# Patient Record
Sex: Male | Born: 1945 | Race: Black or African American | Hispanic: No | Marital: Married | Smoking: Former smoker
Health system: Southern US, Community
[De-identification: ages and names within clinical notes are randomized; demographics above are authoritative.]

## PROBLEM LIST (undated history)

## (undated) DIAGNOSIS — I739 Peripheral vascular disease, unspecified: Secondary | ICD-10-CM

## (undated) DIAGNOSIS — K5790 Diverticulosis of intestine, part unspecified, without perforation or abscess without bleeding: Secondary | ICD-10-CM

## (undated) DIAGNOSIS — I639 Cerebral infarction, unspecified: Secondary | ICD-10-CM

## (undated) DIAGNOSIS — M1990 Unspecified osteoarthritis, unspecified site: Secondary | ICD-10-CM

## (undated) DIAGNOSIS — I471 Supraventricular tachycardia, unspecified: Secondary | ICD-10-CM

## (undated) DIAGNOSIS — E785 Hyperlipidemia, unspecified: Secondary | ICD-10-CM

## (undated) DIAGNOSIS — D649 Anemia, unspecified: Secondary | ICD-10-CM

## (undated) DIAGNOSIS — I10 Essential (primary) hypertension: Secondary | ICD-10-CM

## (undated) DIAGNOSIS — F0280 Dementia in other diseases classified elsewhere without behavioral disturbance: Secondary | ICD-10-CM

## (undated) DIAGNOSIS — R7303 Prediabetes: Secondary | ICD-10-CM

## (undated) DIAGNOSIS — Z8639 Personal history of other endocrine, nutritional and metabolic disease: Secondary | ICD-10-CM

## (undated) DIAGNOSIS — F0390 Unspecified dementia without behavioral disturbance: Secondary | ICD-10-CM

## (undated) DIAGNOSIS — H269 Unspecified cataract: Secondary | ICD-10-CM

## (undated) DIAGNOSIS — G629 Polyneuropathy, unspecified: Secondary | ICD-10-CM

## (undated) HISTORY — DX: Peripheral vascular disease, unspecified: I73.9

## (undated) HISTORY — DX: Diverticulosis of intestine, part unspecified, without perforation or abscess without bleeding: K57.90

## (undated) HISTORY — DX: Hyperlipidemia, unspecified: E78.5

## (undated) HISTORY — PX: HERNIA REPAIR: SHX51

## (undated) HISTORY — DX: Personal history of other endocrine, nutritional and metabolic disease: Z86.39

## (undated) HISTORY — DX: Supraventricular tachycardia, unspecified: I47.10

## (undated) HISTORY — DX: Cerebral infarction, unspecified: I63.9

## (undated) HISTORY — DX: Polyneuropathy, unspecified: G62.9

---

## 1998-05-17 ENCOUNTER — Emergency Department (HOSPITAL_COMMUNITY): Admission: EM | Admit: 1998-05-17 | Discharge: 1998-05-17 | Payer: Self-pay | Admitting: Emergency Medicine

## 2001-04-13 ENCOUNTER — Emergency Department (HOSPITAL_COMMUNITY): Admission: EM | Admit: 2001-04-13 | Discharge: 2001-04-13 | Payer: Self-pay | Admitting: Emergency Medicine

## 2003-08-29 ENCOUNTER — Emergency Department (HOSPITAL_COMMUNITY): Admission: EM | Admit: 2003-08-29 | Discharge: 2003-08-29 | Payer: Self-pay | Admitting: Emergency Medicine

## 2003-09-14 ENCOUNTER — Emergency Department (HOSPITAL_COMMUNITY): Admission: EM | Admit: 2003-09-14 | Discharge: 2003-09-14 | Payer: Self-pay | Admitting: Emergency Medicine

## 2003-09-20 ENCOUNTER — Ambulatory Visit (HOSPITAL_COMMUNITY): Admission: RE | Admit: 2003-09-20 | Discharge: 2003-09-20 | Payer: Self-pay | Admitting: Orthopedic Surgery

## 2008-07-19 ENCOUNTER — Ambulatory Visit (HOSPITAL_COMMUNITY): Admission: RE | Admit: 2008-07-19 | Discharge: 2008-07-19 | Payer: Self-pay | Admitting: Ophthalmology

## 2009-07-18 ENCOUNTER — Encounter (INDEPENDENT_AMBULATORY_CARE_PROVIDER_SITE_OTHER): Payer: Self-pay | Admitting: General Surgery

## 2009-07-18 ENCOUNTER — Inpatient Hospital Stay (HOSPITAL_COMMUNITY): Admission: RE | Admit: 2009-07-18 | Discharge: 2009-07-23 | Payer: Self-pay | Admitting: General Surgery

## 2009-09-19 ENCOUNTER — Encounter (INDEPENDENT_AMBULATORY_CARE_PROVIDER_SITE_OTHER): Payer: Self-pay | Admitting: General Surgery

## 2009-09-19 ENCOUNTER — Ambulatory Visit (HOSPITAL_COMMUNITY): Admission: RE | Admit: 2009-09-19 | Discharge: 2009-09-20 | Payer: Self-pay | Admitting: General Surgery

## 2009-11-07 ENCOUNTER — Encounter: Payer: Self-pay | Admitting: Internal Medicine

## 2009-11-08 ENCOUNTER — Encounter: Payer: Self-pay | Admitting: Internal Medicine

## 2009-11-19 ENCOUNTER — Ambulatory Visit (HOSPITAL_COMMUNITY): Admission: RE | Admit: 2009-11-19 | Discharge: 2009-11-19 | Payer: Self-pay | Admitting: Internal Medicine

## 2009-11-19 ENCOUNTER — Ambulatory Visit: Payer: Self-pay | Admitting: Internal Medicine

## 2009-11-19 HISTORY — PX: COLONOSCOPY: SHX5424

## 2009-11-21 ENCOUNTER — Encounter: Payer: Self-pay | Admitting: Internal Medicine

## 2010-06-04 NOTE — Medication Information (Signed)
Summary: Tax adviser   Imported By: Rosine Beat 11/07/2009 09:11:34  _____________________________________________________________________  External Attachment:    Type:   Image     Comment:   External Document

## 2010-06-04 NOTE — Letter (Signed)
Summary: Patient Notice, Colon Biopsy Results  [facility]  [address]   [place], [place] [address]   Phone: [phone]  Fax: [phone]       November 21, 2009   Javier [name] [address] [place], [place]  [address] Dec 18, 1945    Dear Javier [name],  I am pleased to inform you that the biopsies taken during your recent colonoscopy did not show any evidence of cancer upon pathologic examination.  Additional information/recommendations:  You should have a repeat colonoscopy examination  in 5 years.  Please call us if you are having persistent problems or have questions about your condition that have not been fully answered at this time.  Sincerely,    R. Roetta Sessions MD, FACP [facility] Ph: [phone]    Fax: [phone]   Appended Document: Patient Notice, Colon Biopsy Results letter mailed to pt  Appended Document: Patient Notice, Colon Biopsy Results REMINDER IN COMPUTER

## 2010-06-04 NOTE — Letter (Signed)
Summary: Internal Other Domingo Dimes  Internal Other Domingo Dimes   Imported By: Cloria Spring LPN 16/02/9603 54:09:81  _____________________________________________________________________  External Attachment:    Type:   Image     Comment:   External Document

## 2010-07-22 LAB — BASIC METABOLIC PANEL
CO2: 31 mEq/L (ref 19–32)
Creatinine, Ser: 0.7 mg/dL (ref 0.4–1.5)
GFR calc Af Amer: >60 mL/min (ref >60)
GFR calc non Af Amer: >60 mL/min (ref >60)

## 2010-07-22 LAB — DIFFERENTIAL
Basophils Relative: 1 % (ref 0–1)
Eosinophils Absolute: 0.2 10*3/uL (ref 0.0–0.7)
Eosinophils Relative: 3 % (ref 0–5)
Lymphocytes Relative: 37 % (ref 12–46)
Monocytes Absolute: 0.8 10*3/uL (ref 0.1–1.0)
Monocytes Relative: 13 % — ABNORMAL HIGH (ref 3–12)
Neutro Abs: 2.7 10*3/uL (ref 1.7–7.7)
Neutrophils Relative %: 46 % (ref 43–77)

## 2010-07-22 LAB — CBC
Hemoglobin: 11.4 g/dL — ABNORMAL LOW (ref 13.0–17.0)
MCHC: 34.4 g/dL (ref 30.0–36.0)
RDW: 14.4 % (ref 11.5–15.5)

## 2010-07-22 LAB — HEPATIC FUNCTION PANEL: Bilirubin, Direct: 0.1 mg/dL (ref 0.0–0.3)

## 2010-07-28 LAB — BASIC METABOLIC PANEL
BUN: 19 mg/dL (ref 6–23)
CO2: 31 mEq/L (ref 19–32)
CO2: 31 mEq/L (ref 19–32)
CO2: 36 mEq/L — ABNORMAL HIGH (ref 19–32)
Calcium: 8.6 mg/dL (ref 8.4–10.5)
Chloride: 96 mEq/L (ref 96–112)
Creatinine, Ser: 0.93 mg/dL (ref 0.4–1.5)
GFR calc Af Amer: >60 mL/min (ref >60)
GFR calc Af Amer: >60 mL/min (ref >60)
GFR calc Af Amer: >60 mL/min (ref >60)
GFR calc non Af Amer: >60 mL/min (ref >60)
GFR calc non Af Amer: >60 mL/min (ref >60)
GFR calc non Af Amer: >60 mL/min (ref >60)
Glucose, Bld: 125 mg/dL — ABNORMAL HIGH (ref 70–99)
Glucose, Bld: 126 mg/dL — ABNORMAL HIGH (ref 70–99)
Glucose, Bld: 160 mg/dL — ABNORMAL HIGH (ref 70–99)
Potassium: 3.5 mEq/L (ref 3.5–5.1)
Sodium: 137 mEq/L (ref 135–145)

## 2010-07-28 LAB — DIFFERENTIAL
Basophils Relative: 0 % (ref 0–1)
Eosinophils Absolute: 0.1 10*3/uL (ref 0.0–0.7)
Eosinophils Relative: 1 % (ref 0–5)
Lymphocytes Relative: 10 % — ABNORMAL LOW (ref 12–46)
Lymphocytes Relative: 14 % (ref 12–46)
Monocytes Absolute: 1.3 10*3/uL — ABNORMAL HIGH (ref 0.1–1.0)
Monocytes Relative: 10 % (ref 3–12)
Monocytes Relative: 16 % — ABNORMAL HIGH (ref 3–12)
Neutro Abs: 6.2 10*3/uL (ref 1.7–7.7)
Neutrophils Relative %: 74 % (ref 43–77)

## 2010-07-28 LAB — MAGNESIUM: Magnesium: 1.9 mg/dL (ref 1.5–2.5)

## 2010-07-28 LAB — GLUCOSE, CAPILLARY
Glucose-Capillary: 117 mg/dL — ABNORMAL HIGH (ref 70–99)
Glucose-Capillary: 120 mg/dL — ABNORMAL HIGH (ref 70–99)
Glucose-Capillary: 122 mg/dL — ABNORMAL HIGH (ref 70–99)
Glucose-Capillary: 124 mg/dL — ABNORMAL HIGH (ref 70–99)
Glucose-Capillary: 129 mg/dL — ABNORMAL HIGH (ref 70–99)
Glucose-Capillary: 134 mg/dL — ABNORMAL HIGH (ref 70–99)
Glucose-Capillary: 138 mg/dL — ABNORMAL HIGH (ref 70–99)

## 2010-07-28 LAB — CBC
HCT: 36.5 % — ABNORMAL LOW (ref 39.0–52.0)
MCHC: 34.1 g/dL (ref 30.0–36.0)

## 2010-08-15 LAB — HEMOGLOBIN AND HEMATOCRIT, BLOOD
HCT: 39.2 % (ref 39.0–52.0)
Hemoglobin: 13.5 g/dL (ref 13.0–17.0)

## 2010-08-15 LAB — BASIC METABOLIC PANEL
Calcium: 9 mg/dL (ref 8.4–10.5)
Creatinine, Ser: 0.74 mg/dL (ref 0.4–1.5)
GFR calc Af Amer: >60 mL/min (ref >60)
Sodium: 137 mEq/L (ref 135–145)

## 2012-05-25 ENCOUNTER — Encounter (HOSPITAL_COMMUNITY): Payer: Self-pay | Admitting: *Deleted

## 2012-05-25 ENCOUNTER — Emergency Department (HOSPITAL_COMMUNITY)
Admission: EM | Admit: 2012-05-25 | Discharge: 2012-05-25 | Disposition: A | Discharge status: Home / Self Care | Payer: Medicare Other | Attending: Emergency Medicine | Admitting: Emergency Medicine

## 2012-05-25 DIAGNOSIS — Z79899 Other long term (current) drug therapy: Secondary | ICD-10-CM | POA: Insufficient documentation

## 2012-05-25 DIAGNOSIS — Y939 Activity, unspecified: Secondary | ICD-10-CM | POA: Insufficient documentation

## 2012-05-25 DIAGNOSIS — H40059 Ocular hypertension, unspecified eye: Secondary | ICD-10-CM

## 2012-05-25 DIAGNOSIS — S058X9A Other injuries of unspecified eye and orbit, initial encounter: Secondary | ICD-10-CM | POA: Insufficient documentation

## 2012-05-25 DIAGNOSIS — X58XXXA Exposure to other specified factors, initial encounter: Secondary | ICD-10-CM | POA: Insufficient documentation

## 2012-05-25 DIAGNOSIS — H5789 Other specified disorders of eye and adnexa: Secondary | ICD-10-CM | POA: Insufficient documentation

## 2012-05-25 DIAGNOSIS — H538 Other visual disturbances: Secondary | ICD-10-CM | POA: Insufficient documentation

## 2012-05-25 DIAGNOSIS — Y929 Unspecified place or not applicable: Secondary | ICD-10-CM | POA: Insufficient documentation

## 2012-05-25 DIAGNOSIS — H40009 Preglaucoma, unspecified, unspecified eye: Secondary | ICD-10-CM | POA: Insufficient documentation

## 2012-05-25 DIAGNOSIS — S0501XA Injury of conjunctiva and corneal abrasion without foreign body, right eye, initial encounter: Secondary | ICD-10-CM

## 2012-05-25 DIAGNOSIS — Z87891 Personal history of nicotine dependence: Secondary | ICD-10-CM | POA: Insufficient documentation

## 2012-05-25 DIAGNOSIS — H5710 Ocular pain, unspecified eye: Secondary | ICD-10-CM | POA: Insufficient documentation

## 2012-05-25 DIAGNOSIS — Z791 Long term (current) use of non-steroidal anti-inflammatories (NSAID): Secondary | ICD-10-CM | POA: Insufficient documentation

## 2012-05-25 DIAGNOSIS — I10 Essential (primary) hypertension: Secondary | ICD-10-CM | POA: Insufficient documentation

## 2012-05-25 DIAGNOSIS — H269 Unspecified cataract: Secondary | ICD-10-CM | POA: Insufficient documentation

## 2012-05-25 HISTORY — DX: Unspecified cataract: H26.9

## 2012-05-25 HISTORY — DX: Essential (primary) hypertension: I10

## 2012-05-25 MED ORDER — TETRACAINE HCL 0.5 % OP SOLN
2.0000 [drp] | Freq: Once | OPHTHALMIC | Status: AC
  Administered 2012-05-25: 2 [drp] via OPHTHALMIC
  Filled 2012-05-25: qty 2

## 2012-05-25 MED ORDER — FLUORESCEIN SODIUM 1 MG OP STRP
ORAL_STRIP | OPHTHALMIC | Status: AC
  Administered 2012-05-25: 11:00:00
  Filled 2012-05-25: qty 1

## 2012-05-25 NOTE — ED Provider Notes (Signed)
History     CSN: [id number]  Arrival date & time 05/25/12  8657   First MD Initiated Contact with Patient 05/25/12 [phone]      Chief Complaint  Patient presents with  . Eye Problem    (Consider location/radiation/quality/duration/timing/severity/associated sxs/prior treatment) HPI Comments: Patient c/o "foggy" vision in the right eye for several days.  States that he has been told he has a cataract in the right eye.  Describes the "fogginess" as gradual in onset and occasionally accompanied by pain to the right eye.  He states that he is suppose to wear glasses but does not have money to buy them and he is waiting until he is egilible for Medicare.  He denies contacts, fever, chills, neck pain, headache, or "curtain sensation" to his vision.    Has appt with his eye doctor on January 31  Patient is a 67 y.o. male presenting with eye pain. The history is provided by the patient.  Eye Pain This is a recurrent problem. The current episode started in the past 7 days. The problem occurs constantly. The problem has been unchanged. Associated symptoms include a visual change. Pertinent negatives include no chest pain, fever, headaches, myalgias, nausea, neck pain, numbness, rash, sore throat, urinary symptoms, vomiting or weakness. Nothing aggravates the symptoms. He has tried nothing for the symptoms. The treatment provided no relief.    Past Medical History  Diagnosis Date  . Hypertension   . Cataract     Past Surgical History  Procedure Date  . Hernia repair     No family history on file.  History  Substance Use Topics  . Smoking status: Former Games developer  . Smokeless tobacco: Not on file  . Alcohol Use: No      Review of Systems  Constitutional: Negative for fever.  HENT: Negative for sore throat and neck pain.   Eyes: Positive for pain, redness and visual disturbance. Negative for photophobia, discharge and itching.  Respiratory: Negative for chest tightness and shortness of  breath.   Cardiovascular: Negative for chest pain.  Gastrointestinal: Negative for nausea and vomiting.  Musculoskeletal: Negative for myalgias.  Skin: Negative for rash.  Neurological: Negative for dizziness, seizures, syncope, facial asymmetry, speech difficulty, weakness, numbness and headaches.  All other systems reviewed and are negative.    Allergies  Review of patient's allergies indicates no known allergies.  Home Medications   Current Outpatient Rx  Name  Route  Sig  Dispense  Refill  . AMLODIPINE BESYLATE 5 MG PO TABS   Oral   Take 5 mg by mouth daily.         Marland Kitchen METOPROLOL SUCCINATE ER 50 MG PO TB24   Oral   Take 50 mg by mouth daily. Take with or immediately following a meal.         . NAPROXEN 500 MG PO TABS   Oral   Take 500 mg by mouth 2 (two) times daily with a meal.         . OLMESARTAN MEDOXOMIL 20 MG PO TABS   Oral   Take 20 mg by mouth daily.         Marland Kitchen ROSUVASTATIN CALCIUM 20 MG PO TABS   Oral   Take 20 mg by mouth daily.           BP 158/85  Pulse 71  Temp 97.7 F (36.5 C)  Resp 20  SpO2 98%  Physical Exam  Nursing note and vitals reviewed. Constitutional: He is oriented  to person, place, and time. He appears well-developed and well-nourished. No distress.  HENT:  Head: Normocephalic and atraumatic.  Eyes: Pupils are equal, round, and reactive to light. No foreign bodies found. Right eye exhibits no chemosis and no discharge. Left eye exhibits no chemosis and no discharge. Right conjunctiva is injected. Right conjunctiva has no hemorrhage. Left conjunctiva is not injected. Left conjunctiva has no hemorrhage. Right eye exhibits normal extraocular motion and no nystagmus. Left eye exhibits normal extraocular motion and no nystagmus. Pupils are equal.  Fundoscopic exam:      The right eye shows no exudate and no papilledema.  Slit lamp exam:      The right eye shows corneal abrasion and fluorescein uptake. The right eye shows no  corneal flare, no corneal ulcer, no foreign body, no hyphema and no hypopyon.  Neck: Normal range of motion. Neck supple. No thyromegaly present.  Cardiovascular: Normal rate, normal heart sounds and intact distal pulses.   No murmur heard. Pulmonary/Chest: Effort normal and breath sounds normal. No respiratory distress.  Musculoskeletal: Normal range of motion. He exhibits no edema.  Lymphadenopathy:    He has no cervical adenopathy.  Neurological: He is alert and oriented to person, place, and time. He exhibits normal muscle tone. Coordination normal.  Skin: Skin is warm and dry.    ED Course  Procedures (including critical care time)  Labs Reviewed - No data to display No results found.  IOP checked with [facility]  Right IOP:  32, 32, 44, 34 Left IOP:  7,10, 14   Visual acuity reviewed by me.    MDM  Pt seen by EDP as well and discussed care plan.  Pt is ambulatory and denies sx's at this time.      11:15  Consulted Dr. Charise Killian with [facility] regarding patient exam today and IOP's.  Will see pt in his office.  Appt made for today (05/25/12) at 2:00 pm.    Pt verbalized understanding and agrees to the care plan.         Javonnie Illescas L. Timberlynn Kizziah, [place] 05/27/12 1659

## 2012-05-25 NOTE — ED Notes (Signed)
Pt c/o "foggy" vision in right eye, pt has hx of cataract in right eye, states that his vision has become "foggy" over the past week, has eye appointment on June 05, 2011. States that the eye does "water",

## 2012-05-30 NOTE — ED Provider Notes (Signed)
Medical screening examination/treatment/procedure(s) were performed by non-physician practitioner and as supervising physician I was immediately available for consultation/collaboration.   Benny Lennert, MD 05/30/12 [phone]

## 2012-08-09 DIAGNOSIS — Z947 Corneal transplant status: Secondary | ICD-10-CM | POA: Insufficient documentation

## 2014-10-17 ENCOUNTER — Encounter: Payer: Self-pay | Admitting: Internal Medicine

## 2014-11-16 ENCOUNTER — Other Ambulatory Visit: Payer: Self-pay

## 2014-11-16 ENCOUNTER — Ambulatory Visit (INDEPENDENT_AMBULATORY_CARE_PROVIDER_SITE_OTHER): Payer: Medicare Other | Admitting: Nurse Practitioner

## 2014-11-16 ENCOUNTER — Encounter: Payer: Self-pay | Admitting: Nurse Practitioner

## 2014-11-16 VITALS — BP 114/67 | HR 70 | Temp 97.9°F | Ht 71.0 in | Wt 168.0 lb

## 2014-11-16 DIAGNOSIS — Z86010 Personal history of colonic polyps: Secondary | ICD-10-CM | POA: Diagnosis not present

## 2014-11-16 MED ORDER — PEG 3350-KCL-NA BICARB-NACL 420 G PO SOLR: 4000.0000 mL | Freq: Once | ORAL | Status: DC

## 2014-11-16 NOTE — Patient Instructions (Signed)
1. We will see her procedure for you. 2. Further recommendations to be based on the results of your procedure.

## 2014-11-16 NOTE — Progress Notes (Signed)
Primary Care Physician:  Maricela Curet, MD Primary Gastroenterologist:  Dr. Gala Romney  Chief Complaint  Patient presents with  . Colonoscopy    HPI:   69 year old male presents for scheduling of a repeat colonoscopy. Last colonoscopy completed on 11/19/2009 under conscious sedation. Findings include multiple small rectal polyps status post polypectomy, multiple left colon polyps status post polypectomy. Noted poor prep. Pathology returned as descending and sigmoid polyps comprised of serrated adenoma and rectal polyps as hyperplastic. Recommended 5 year repeat. He is currently due for a repeat colonoscopy.  Today he states   Past Medical History  Diagnosis Date  . Hypertension   . Cataract     Past Surgical History  Procedure Laterality Date  . Hernia repair    . Colonoscopy  11/19/2009    RMR: 1. Multiple small rectal polyps status post snare polypectomy. 2. Multiple left colon polyp status post snare removal . Poor prep compromised exam.     Current Outpatient Prescriptions  Medication Sig Dispense Refill  . amLODipine (NORVASC) 5 MG tablet Take 5 mg by mouth daily.    . metoprolol succinate (TOPROL-XL) 50 MG 24 hr tablet Take 50 mg by mouth daily. Take with or immediately following a meal.    . naproxen (NAPROSYN) 500 MG tablet Take 500 mg by mouth 2 (two) times daily with a meal.    . olmesartan (BENICAR) 20 MG tablet Take 20 mg by mouth daily.    . rosuvastatin (CRESTOR) 20 MG tablet Take 20 mg by mouth daily.     No current facility-administered medications for this visit.    Allergies as of 11/16/2014  . (No Known Allergies)    No family history on file.  History   Social History  . Marital Status: Married    Spouse Name: N/A  . Number of Children: N/A  . Years of Education: N/A   Occupational History  . Not on file.   Social History Main Topics  . Smoking status: Former Research scientist (life sciences)  . Smokeless tobacco: Not on file  . Alcohol Use: No  . Drug Use: No   . Sexual Activity: Not on file   Other Topics Concern  . Not on file   Social History Narrative    Review of Systems: General: Negative for anorexia, weight loss, fever, chills, fatigue, weakness. ENT: Negative for hoarseness, difficulty swallowing. CV: Negative for chest pain, angina, palpitations, dyspnea on exertion, peripheral edema.  Respiratory: Negative for dyspnea at rest, dyspnea on exertion, cough, sputum, wheezing.  GI: See history of present illness. MS: Negative for joint pain, low back pain.  Derm: Negative for rash or itching.  Endo: Negative for unusual weight change.  Heme: Negative for bruising or bleeding. Allergy: Negative for rash or hives.    Physical Exam: BP 114/67 mmHg  Pulse 70  Temp(Src) 97.9 F (36.6 C) (Oral)  Ht 5\' 11"  (1.803 m)  Wt 168 lb (76.204 kg)  BMI 23.44 kg/m2 General:   Alert and oriented. Pleasant and cooperative. Well-nourished and well-developed.  Head:  Normocephalic and atraumatic. Eyes:  Without icterus, sclera clear and conjunctiva pink.  Ears:  Normal auditory acuity. Cardiovascular:  S1, S2 present without murmurs appreciated. Extremities without clubbing or edema. Respiratory:  Clear to auscultation bilaterally. No wheezes, rales, or rhonchi. No distress.  Gastrointestinal:  +BS, soft, non-tender and non-distended. No HSM noted. No guarding or rebound. No masses appreciated.  Rectal:  Deferred  Neurologic:  Alert and oriented x4;  grossly normal neurologically. Psych:  Alert and cooperative. Normal mood and affect. Heme/Lymph/Immune: No excessive bruising noted.    11/16/2014 10:15 AM

## 2014-11-16 NOTE — Assessment & Plan Note (Signed)
69 year old male with last colonoscopy in 2011 with adenomatous polyps removed. Recommend 5 year follow-up and he is currently due. He is currently asymptomatic from a GI standpoint. States his conscious sedation was adequate at his last procedure. At this point we will proceed with surveillance colonoscopy.  Proceed with TCS with Dr. Gala Romney in near future: the risks, benefits, and alternatives have been discussed with the patient in detail. The patient states understanding and desires to proceed.  The patient is not on any anticoagulants, anxiolytics, chronic pain medicines, or antidepressants. Denies alcohol and drug use. Conscious sedation should again be adequate for this procedure.

## 2014-11-17 NOTE — Progress Notes (Signed)
cc'ed to pcp °

## 2014-12-13 ENCOUNTER — Encounter (HOSPITAL_COMMUNITY): Payer: Self-pay | Admitting: *Deleted

## 2014-12-13 ENCOUNTER — Encounter (HOSPITAL_COMMUNITY)
Admission: RE | Disposition: A | Discharge status: Home / Self Care | Payer: Self-pay | Source: Ambulatory Visit | Attending: Internal Medicine

## 2014-12-13 ENCOUNTER — Ambulatory Visit (HOSPITAL_COMMUNITY)
Admission: RE | Admit: 2014-12-13 | Discharge: 2014-12-13 | Disposition: A | Discharge status: Home / Self Care | Payer: Medicare Other | Source: Ambulatory Visit | Attending: Internal Medicine | Admitting: Internal Medicine

## 2014-12-13 DIAGNOSIS — Z87891 Personal history of nicotine dependence: Secondary | ICD-10-CM | POA: Diagnosis not present

## 2014-12-13 DIAGNOSIS — Z1211 Encounter for screening for malignant neoplasm of colon: Secondary | ICD-10-CM | POA: Insufficient documentation

## 2014-12-13 DIAGNOSIS — H269 Unspecified cataract: Secondary | ICD-10-CM | POA: Insufficient documentation

## 2014-12-13 DIAGNOSIS — K5730 Diverticulosis of large intestine without perforation or abscess without bleeding: Secondary | ICD-10-CM | POA: Diagnosis not present

## 2014-12-13 DIAGNOSIS — I10 Essential (primary) hypertension: Secondary | ICD-10-CM | POA: Diagnosis not present

## 2014-12-13 DIAGNOSIS — Z86010 Personal history of colonic polyps: Secondary | ICD-10-CM | POA: Insufficient documentation

## 2014-12-13 HISTORY — PX: COLONOSCOPY: SHX5424

## 2014-12-13 SURGERY — COLONOSCOPY
Anesthesia: Moderate Sedation

## 2014-12-13 MED ORDER — MEPERIDINE HCL 100 MG/ML IJ SOLN
INTRAMUSCULAR | Status: AC
  Filled 2014-12-13: qty 2

## 2014-12-13 MED ORDER — SODIUM CHLORIDE 0.9 % IV SOLN
INTRAVENOUS | Status: DC
  Administered 2014-12-13: 1000 mL via INTRAVENOUS

## 2014-12-13 MED ORDER — MIDAZOLAM HCL 5 MG/5ML IJ SOLN
INTRAMUSCULAR | Status: DC | PRN
  Administered 2014-12-13 (×3): 2 mg via INTRAVENOUS
  Administered 2014-12-13: 1 mg via INTRAVENOUS

## 2014-12-13 MED ORDER — ONDANSETRON HCL 4 MG/2ML IJ SOLN
INTRAMUSCULAR | Status: DC
Start: 2014-12-13 — End: 2014-12-13
  Filled 2014-12-13: qty 2

## 2014-12-13 MED ORDER — ONDANSETRON HCL 4 MG/2ML IJ SOLN
INTRAMUSCULAR | Status: DC | PRN
  Administered 2014-12-13: 4 mg via INTRAVENOUS

## 2014-12-13 MED ORDER — MEPERIDINE HCL 100 MG/ML IJ SOLN
INTRAMUSCULAR | Status: DC | PRN
  Administered 2014-12-13 (×2): 50 mg via INTRAVENOUS

## 2014-12-13 MED ORDER — STERILE WATER FOR IRRIGATION IR SOLN
Status: DC | PRN
  Administered 2014-12-13: 10:00:00

## 2014-12-13 MED ORDER — SIMETHICONE 40 MG/0.6ML PO SUSP
ORAL | Status: AC
  Filled 2014-12-13: qty 60

## 2014-12-13 MED ORDER — MIDAZOLAM HCL 5 MG/5ML IJ SOLN
INTRAMUSCULAR | Status: DC
Start: 2014-12-13 — End: 2014-12-13
  Filled 2014-12-13: qty 10

## 2014-12-13 NOTE — Interval H&P Note (Signed)
History and Physical Interval Note:  12/13/2014 9:56 AM  Javier [name]  has presented today for surgery, with the diagnosis of hx of colon polyps  The various methods of treatment have been discussed with the patient and family. After consideration of risks, benefits and other options for treatment, the patient has consented to  Procedure(s) with comments: COLONOSCOPY (N/A) - 945 as a surgical intervention .  The patient's history has been reviewed, patient examined, no change in status, stable for surgery.  I have reviewed the patient's chart and labs.  Questions were answered to the patient's satisfaction.      No change. Surveillance colonoscopy per plan.   The risks, benefits, limitations, alternatives and imponderables have been reviewed with the patient. Questions have been answered. All parties are agreeable.     Manus Rudd

## 2014-12-13 NOTE — Discharge Instructions (Signed)
Colonoscopy Discharge Instructions  Read the instructions outlined below and refer to this sheet in the next few weeks. These discharge instructions provide you with general information on caring for yourself after you leave the hospital. Your doctor may also give you specific instructions. While your treatment has been planned according to the most current medical practices available, unavoidable complications occasionally occur. If you have any problems or questions after discharge, call Dr. Gala Romney at [phone]. ACTIVITY  You may resume your regular activity, but move at a slower pace for the next 24 hours.   Take frequent rest periods for the next 24 hours.   Walking will help get rid of the air and reduce the bloated feeling in your belly (abdomen).   No driving for 24 hours (because of the medicine (anesthesia) used during the test).    Do not sign any important legal documents or operate any machinery for 24 hours (because of the anesthesia used during the test).  NUTRITION  Drink plenty of fluids.   You may resume your normal diet as instructed by your doctor.   Begin with a light meal and progress to your normal diet. Heavy or fried foods are harder to digest and may make you feel sick to your stomach (nauseated).   Avoid alcoholic beverages for 24 hours or as instructed.  MEDICATIONS  You may resume your normal medications unless your doctor tells you otherwise.  WHAT YOU CAN EXPECT TODAY  Some feelings of bloating in the abdomen.   Passage of more gas than usual.   Spotting of blood in your stool or on the toilet paper.  IF YOU HAD POLYPS REMOVED DURING THE COLONOSCOPY:  No aspirin products for 7 days or as instructed.   No alcohol for 7 days or as instructed.   Eat a soft diet for the next 24 hours.  FINDING OUT THE RESULTS OF YOUR TEST Not all test results are available during your visit. If your test results are not back during the visit, make an appointment  with your caregiver to find out the results. Do not assume everything is normal if you have not heard from your caregiver or the medical facility. It is important for you to follow up on all of your test results.  SEEK IMMEDIATE MEDICAL ATTENTION IF:  You have more than a spotting of blood in your stool.   Your belly is swollen (abdominal distention).   You are nauseated or vomiting.   You have a temperature over 101.   You have abdominal pain or discomfort that is severe or gets worse throughout the day.      Diverticulosis information provided  Repeat colonoscopy in 5 years    Diverticulosis Diverticulosis is the condition that develops when small pouches (diverticula) form in the wall of your colon. Your colon, or large intestine, is where water is absorbed and stool is formed. The pouches form when the inside layer of your colon pushes through weak spots in the outer layers of your colon. CAUSES  No one knows exactly what causes diverticulosis. RISK FACTORS  Being older than 20. Your risk for this condition increases with age. Diverticulosis is rare in people younger than 40 years. By age 55, almost everyone has it.  Eating a low-fiber diet.  Being frequently constipated.  Being overweight.  Not getting enough exercise.  Smoking.  Taking over-the-counter pain medicines, like aspirin and ibuprofen. SYMPTOMS  Most people with diverticulosis do not have symptoms. DIAGNOSIS  Because diverticulosis often  has no symptoms, health care providers often discover the condition during an exam for other colon problems. In many cases, a health care provider will diagnose diverticulosis while using a flexible scope to examine the colon (colonoscopy). TREATMENT  If you have never developed an infection related to diverticulosis, you may not need treatment. If you have had an infection before, treatment may include:  Eating more fruits, vegetables, and grains.  Taking a fiber  supplement.  Taking a live bacteria supplement (probiotic).  Taking medicine to relax your colon. HOME CARE INSTRUCTIONS   Drink at least 6-8 glasses of water each day to prevent constipation.  Try not to strain when you have a bowel movement.  Keep all follow-up appointments. If you have had an infection before:  Increase the fiber in your diet as directed by your health care provider or dietitian.  Take a dietary fiber supplement if your health care provider approves.  Only take medicines as directed by your health care provider. SEEK MEDICAL CARE IF:   You have abdominal pain.  You have bloating.  You have cramps.  You have not gone to the bathroom in 3 days. SEEK IMMEDIATE MEDICAL CARE IF:   Your pain gets worse.  Yourbloating becomes very bad.  You have a fever or chills, and your symptoms suddenly get worse.  You begin vomiting.  You have bowel movements that are bloody or black. MAKE SURE YOU:  Understand these instructions.  Will watch your condition.  Will get help right away if you are not doing well or get worse. Document Released: 01/17/2004 Document Revised: 04/26/2013 Document Reviewed: 03/16/2013 [facility] Patient Information 2015 [place], [place]. This information is not intended to replace advice given to you by your health care provider. Make sure you discuss any questions you have with your health care provider.

## 2014-12-13 NOTE — Op Note (Signed)
[facility] [address] [place], [address]   COLONOSCOPY PROCEDURE REPORT  PATIENT: Javier, [name]  MR#: [id number] BIRTHDATE: [birth year] , 69  yrs. old GENDER: male ENDOSCOPIST: R.  Garfield Cornea, MD FACP [facility] REFERRED MH:[id number] Cindie Laroche, M.D. PROCEDURE DATE:  2014/12/30 PROCEDURE:   Colonoscopy, surveillance INDICATIONS:History of colonic adenoma. MEDICATIONS: Versed 7 mg IV and Demerol 100 mg IV in divided doses. Zofran 4 mg IV. ASA CLASS:       Class II  CONSENT: The risks, benefits, alternatives and imponderables including but not limited to bleeding, perforation as well as the possibility of a missed lesion have been reviewed.  The potential for biopsy, lesion removal, etc. have also been discussed. Questions have been answered.  All parties agreeable.  Please see the history and physical in the medical record for more information.  DESCRIPTION OF PROCEDURE:   After the risks benefits and alternatives of the procedure were thoroughly explained, informed consent was obtained.  The digital rectal exam revealed no abnormalities of the rectum.   The EC-[id number] ([id number])  endoscope was introduced through the anus and advanced to the cecum, which was identified by both the appendix and ileocecal valve. No adverse events experienced.   The quality of the prep was adequate  The instrument was then slowly withdrawn as the colon was fully examined. Estimated blood loss is zero unless otherwise noted in this procedure report.      COLON FINDINGS: Normal-appearing rectal mucosa.  Scattered left-sided diverticula; the remainder of the colonic mucosa appeared normal.  Retroflexed views revealed no abnormalities. .  Withdrawal time=8 minutes 0 seconds.  The scope was withdrawn and the procedure completed. COMPLICATIONS: There were no immediate complications.  ENDOSCOPIC IMPRESSION: Colonic diverticulosis.  RECOMMENDATIONS: Repeat colonoscopy for  surveillance purposes in 5 years  eSigned:  R. Garfield Cornea, MD Rosalita Chessman [facility] 2014-12-30 10:45 AM   cc:  CPT CODES: ICD CODES:  The ICD and CPT codes recommended by this software are interpretations from the data that the clinical staff has captured with the software.  The verification of the translation of this report to the ICD and CPT codes and modifiers is the sole responsibility of the health care institution and practicing physician where this report was generated.  [employer] will not be held responsible for the validity of the ICD and CPT codes included on this report.  AMA assumes no liability for data contained or not contained herein. CPT is a Designer, television/film set of the [employer].  PATIENT NAME:  Javier, [name] MR#: [id number]

## 2014-12-13 NOTE — H&P (View-Only) (Signed)
Primary Care Physician:  Maricela Curet, MD Primary Gastroenterologist:  Dr. Gala Romney  Chief Complaint  Patient presents with  . Colonoscopy    HPI:   69 year old male presents for scheduling of a repeat colonoscopy. Last colonoscopy completed on 11/19/2009 under conscious sedation. Findings include multiple small rectal polyps status post polypectomy, multiple left colon polyps status post polypectomy. Noted poor prep. Pathology returned as descending and sigmoid polyps comprised of serrated adenoma and rectal polyps as hyperplastic. Recommended 5 year repeat. He is currently due for a repeat colonoscopy.  Today he states   Past Medical History  Diagnosis Date  . Hypertension   . Cataract     Past Surgical History  Procedure Laterality Date  . Hernia repair    . Colonoscopy  11/19/2009    RMR: 1. Multiple small rectal polyps status post snare polypectomy. 2. Multiple left colon polyp status post snare removal . Poor prep compromised exam.     Current Outpatient Prescriptions  Medication Sig Dispense Refill  . amLODipine (NORVASC) 5 MG tablet Take 5 mg by mouth daily.    . metoprolol succinate (TOPROL-XL) 50 MG 24 hr tablet Take 50 mg by mouth daily. Take with or immediately following a meal.    . naproxen (NAPROSYN) 500 MG tablet Take 500 mg by mouth 2 (two) times daily with a meal.    . olmesartan (BENICAR) 20 MG tablet Take 20 mg by mouth daily.    . rosuvastatin (CRESTOR) 20 MG tablet Take 20 mg by mouth daily.     No current facility-administered medications for this visit.    Allergies as of 11/16/2014  . (No Known Allergies)    No family history on file.  History   Social History  . Marital Status: Married    Spouse Name: N/A  . Number of Children: N/A  . Years of Education: N/A   Occupational History  . Not on file.   Social History Main Topics  . Smoking status: Former Research scientist (life sciences)  . Smokeless tobacco: Not on file  . Alcohol Use: No  . Drug Use: No   . Sexual Activity: Not on file   Other Topics Concern  . Not on file   Social History Narrative    Review of Systems: General: Negative for anorexia, weight loss, fever, chills, fatigue, weakness. ENT: Negative for hoarseness, difficulty swallowing. CV: Negative for chest pain, angina, palpitations, dyspnea on exertion, peripheral edema.  Respiratory: Negative for dyspnea at rest, dyspnea on exertion, cough, sputum, wheezing.  GI: See history of present illness. MS: Negative for joint pain, low back pain.  Derm: Negative for rash or itching.  Endo: Negative for unusual weight change.  Heme: Negative for bruising or bleeding. Allergy: Negative for rash or hives.    Physical Exam: BP 114/67 mmHg  Pulse 70  Temp(Src) 97.9 F (36.6 C) (Oral)  Ht 5\' 11"  (1.803 m)  Wt 168 lb (76.204 kg)  BMI 23.44 kg/m2 General:   Alert and oriented. Pleasant and cooperative. Well-nourished and well-developed.  Head:  Normocephalic and atraumatic. Eyes:  Without icterus, sclera clear and conjunctiva pink.  Ears:  Normal auditory acuity. Cardiovascular:  S1, S2 present without murmurs appreciated. Extremities without clubbing or edema. Respiratory:  Clear to auscultation bilaterally. No wheezes, rales, or rhonchi. No distress.  Gastrointestinal:  +BS, soft, non-tender and non-distended. No HSM noted. No guarding or rebound. No masses appreciated.  Rectal:  Deferred  Neurologic:  Alert and oriented x4;  grossly normal neurologically. Psych:  Alert and cooperative. Normal mood and affect. Heme/Lymph/Immune: No excessive bruising noted.    11/16/2014 10:15 AM

## 2014-12-18 ENCOUNTER — Encounter (HOSPITAL_COMMUNITY): Payer: Self-pay | Admitting: Internal Medicine

## 2016-04-02 ENCOUNTER — Other Ambulatory Visit (HOSPITAL_COMMUNITY): Payer: Self-pay | Admitting: Family Medicine

## 2016-04-02 ENCOUNTER — Ambulatory Visit (HOSPITAL_COMMUNITY)
Admission: RE | Admit: 2016-04-02 | Discharge: 2016-04-02 | Disposition: A | Discharge status: Home / Self Care | Payer: Medicare Other | Source: Ambulatory Visit | Attending: Family Medicine | Admitting: Family Medicine

## 2016-04-02 DIAGNOSIS — M7731 Calcaneal spur, right foot: Secondary | ICD-10-CM | POA: Insufficient documentation

## 2016-04-02 DIAGNOSIS — M19071 Primary osteoarthritis, right ankle and foot: Secondary | ICD-10-CM | POA: Insufficient documentation

## 2016-04-02 DIAGNOSIS — T148XXA Other injury of unspecified body region, initial encounter: Secondary | ICD-10-CM

## 2016-05-16 DIAGNOSIS — I110 Hypertensive heart disease with heart failure: Secondary | ICD-10-CM | POA: Diagnosis not present

## 2016-05-16 DIAGNOSIS — N400 Enlarged prostate without lower urinary tract symptoms: Secondary | ICD-10-CM | POA: Diagnosis not present

## 2016-05-16 DIAGNOSIS — E784 Other hyperlipidemia: Secondary | ICD-10-CM | POA: Diagnosis not present

## 2016-05-16 DIAGNOSIS — N529 Male erectile dysfunction, unspecified: Secondary | ICD-10-CM | POA: Diagnosis not present

## 2016-06-16 DIAGNOSIS — N529 Male erectile dysfunction, unspecified: Secondary | ICD-10-CM | POA: Diagnosis not present

## 2016-06-16 DIAGNOSIS — E1165 Type 2 diabetes mellitus with hyperglycemia: Secondary | ICD-10-CM | POA: Diagnosis not present

## 2016-06-16 DIAGNOSIS — M2550 Pain in unspecified joint: Secondary | ICD-10-CM | POA: Diagnosis not present

## 2016-06-16 DIAGNOSIS — E784 Other hyperlipidemia: Secondary | ICD-10-CM | POA: Diagnosis not present

## 2016-07-15 DIAGNOSIS — M2550 Pain in unspecified joint: Secondary | ICD-10-CM | POA: Diagnosis not present

## 2016-07-15 DIAGNOSIS — N400 Enlarged prostate without lower urinary tract symptoms: Secondary | ICD-10-CM | POA: Diagnosis not present

## 2016-07-15 DIAGNOSIS — N529 Male erectile dysfunction, unspecified: Secondary | ICD-10-CM | POA: Diagnosis not present

## 2016-07-15 DIAGNOSIS — E784 Other hyperlipidemia: Secondary | ICD-10-CM | POA: Diagnosis not present

## 2016-08-20 DIAGNOSIS — M2550 Pain in unspecified joint: Secondary | ICD-10-CM | POA: Diagnosis not present

## 2016-08-20 DIAGNOSIS — R5382 Chronic fatigue, unspecified: Secondary | ICD-10-CM | POA: Diagnosis not present

## 2016-08-20 DIAGNOSIS — E784 Other hyperlipidemia: Secondary | ICD-10-CM | POA: Diagnosis not present

## 2016-08-20 DIAGNOSIS — I110 Hypertensive heart disease with heart failure: Secondary | ICD-10-CM | POA: Diagnosis not present

## 2016-10-10 DIAGNOSIS — M2550 Pain in unspecified joint: Secondary | ICD-10-CM | POA: Diagnosis not present

## 2016-10-10 DIAGNOSIS — E784 Other hyperlipidemia: Secondary | ICD-10-CM | POA: Diagnosis not present

## 2016-11-18 DIAGNOSIS — I110 Hypertensive heart disease with heart failure: Secondary | ICD-10-CM | POA: Diagnosis not present

## 2016-11-18 DIAGNOSIS — E784 Other hyperlipidemia: Secondary | ICD-10-CM | POA: Diagnosis not present

## 2016-11-18 DIAGNOSIS — M2550 Pain in unspecified joint: Secondary | ICD-10-CM | POA: Diagnosis not present

## 2016-11-18 DIAGNOSIS — R5382 Chronic fatigue, unspecified: Secondary | ICD-10-CM | POA: Diagnosis not present

## 2016-11-25 DIAGNOSIS — E784 Other hyperlipidemia: Secondary | ICD-10-CM | POA: Diagnosis not present

## 2016-11-25 DIAGNOSIS — R945 Abnormal results of liver function studies: Secondary | ICD-10-CM | POA: Diagnosis not present

## 2016-12-02 DIAGNOSIS — M2550 Pain in unspecified joint: Secondary | ICD-10-CM | POA: Diagnosis not present

## 2016-12-02 DIAGNOSIS — R5382 Chronic fatigue, unspecified: Secondary | ICD-10-CM | POA: Diagnosis not present

## 2016-12-02 DIAGNOSIS — I110 Hypertensive heart disease with heart failure: Secondary | ICD-10-CM | POA: Diagnosis not present

## 2016-12-02 DIAGNOSIS — E784 Other hyperlipidemia: Secondary | ICD-10-CM | POA: Diagnosis not present

## 2017-01-19 DIAGNOSIS — R5382 Chronic fatigue, unspecified: Secondary | ICD-10-CM | POA: Diagnosis not present

## 2017-01-19 DIAGNOSIS — I110 Hypertensive heart disease with heart failure: Secondary | ICD-10-CM | POA: Diagnosis not present

## 2017-01-19 DIAGNOSIS — E784 Other hyperlipidemia: Secondary | ICD-10-CM | POA: Diagnosis not present

## 2017-01-19 DIAGNOSIS — M2550 Pain in unspecified joint: Secondary | ICD-10-CM | POA: Diagnosis not present

## 2017-02-17 DIAGNOSIS — I119 Hypertensive heart disease without heart failure: Secondary | ICD-10-CM | POA: Diagnosis not present

## 2017-02-17 DIAGNOSIS — I110 Hypertensive heart disease with heart failure: Secondary | ICD-10-CM | POA: Diagnosis not present

## 2017-02-17 DIAGNOSIS — E7849 Other hyperlipidemia: Secondary | ICD-10-CM | POA: Diagnosis not present

## 2017-02-17 DIAGNOSIS — R5382 Chronic fatigue, unspecified: Secondary | ICD-10-CM | POA: Diagnosis not present

## 2017-02-17 DIAGNOSIS — E559 Vitamin D deficiency, unspecified: Secondary | ICD-10-CM | POA: Diagnosis not present

## 2017-02-17 DIAGNOSIS — M2550 Pain in unspecified joint: Secondary | ICD-10-CM | POA: Diagnosis not present

## 2017-02-17 DIAGNOSIS — R5383 Other fatigue: Secondary | ICD-10-CM | POA: Diagnosis not present

## 2017-02-17 DIAGNOSIS — D510 Vitamin B12 deficiency anemia due to intrinsic factor deficiency: Secondary | ICD-10-CM | POA: Diagnosis not present

## 2017-04-21 DIAGNOSIS — E7849 Other hyperlipidemia: Secondary | ICD-10-CM | POA: Diagnosis not present

## 2017-04-21 DIAGNOSIS — R5382 Chronic fatigue, unspecified: Secondary | ICD-10-CM | POA: Diagnosis not present

## 2017-04-21 DIAGNOSIS — I110 Hypertensive heart disease with heart failure: Secondary | ICD-10-CM | POA: Diagnosis not present

## 2017-05-26 DIAGNOSIS — R5382 Chronic fatigue, unspecified: Secondary | ICD-10-CM | POA: Diagnosis not present

## 2017-05-26 DIAGNOSIS — E7849 Other hyperlipidemia: Secondary | ICD-10-CM | POA: Diagnosis not present

## 2017-05-26 DIAGNOSIS — I110 Hypertensive heart disease with heart failure: Secondary | ICD-10-CM | POA: Diagnosis not present

## 2017-05-27 DIAGNOSIS — E7849 Other hyperlipidemia: Secondary | ICD-10-CM | POA: Diagnosis not present

## 2017-05-27 DIAGNOSIS — I110 Hypertensive heart disease with heart failure: Secondary | ICD-10-CM | POA: Diagnosis not present

## 2019-08-25 ENCOUNTER — Ambulatory Visit: Payer: Medicare Other | Attending: Internal Medicine

## 2019-08-25 DIAGNOSIS — Z23 Encounter for immunization: Secondary | ICD-10-CM

## 2019-08-25 NOTE — Progress Notes (Signed)
   Covid-19 Vaccination Clinic  Name:  Javier [name]    MRN: [id number] DOB: [birth year]  08/25/2019  [name] was observed post Covid-19 immunization for 15 minutes without incident. He was provided with Vaccine Information Sheet and instruction to access the V-Safe system.   [name] was instructed to call 911 with any severe reactions post vaccine: Marland Kitchen Difficulty breathing  . Swelling of face and throat  . A fast heartbeat  . A bad rash all over body  . Dizziness and weakness   Immunizations Administered    Name Date Dose VIS Date Route   Moderna COVID-19 Vaccine 08/25/2019  8:36 AM 0.5 mL 04/2019 Intramuscular   Manufacturer: Moderna   Lot: [id number]   [place]:[id number]

## 2019-09-27 ENCOUNTER — Ambulatory Visit: Payer: Medicare Other | Attending: Internal Medicine

## 2019-09-27 ENCOUNTER — Other Ambulatory Visit: Payer: Self-pay

## 2019-09-27 ENCOUNTER — Ambulatory Visit: Payer: Medicare Other

## 2019-09-27 DIAGNOSIS — Z23 Encounter for immunization: Secondary | ICD-10-CM

## 2019-09-27 NOTE — Progress Notes (Signed)
   Covid-19 Vaccination Clinic  Name:  Javier [name]    MRN: [id number] DOB: 1946-03-01  09/27/2019  Mr. Javier [name] was observed post Covid-19 immunization for 15 minutes without incident. He was provided with Vaccine Information Sheet and instruction to access the V-Safe system.   Mr. Javier [name] was instructed to call 911 with any severe reactions post vaccine: Marland Kitchen Difficulty breathing  . Swelling of face and throat  . A fast heartbeat  . A bad rash all over body  . Dizziness and weakness   Immunizations Administered    Name Date Dose VIS Date Route   Moderna COVID-19 Vaccine 09/27/2019  8:59 AM 0.5 mL 04/2019 Intramuscular   Manufacturer: Moderna   Lot: [id number]   [place]:[id number]

## 2019-11-29 ENCOUNTER — Ambulatory Visit (INDEPENDENT_AMBULATORY_CARE_PROVIDER_SITE_OTHER): Payer: Medicare Other | Admitting: General Surgery

## 2019-11-29 ENCOUNTER — Encounter: Payer: Self-pay | Admitting: General Surgery

## 2019-11-29 ENCOUNTER — Other Ambulatory Visit: Payer: Self-pay

## 2019-11-29 VITALS — BP 121/66 | HR 58 | Temp 97.9°F | Resp 12 | Ht 71.0 in | Wt 154.0 lb

## 2019-11-29 DIAGNOSIS — L723 Sebaceous cyst: Secondary | ICD-10-CM

## 2019-11-29 NOTE — Patient Instructions (Signed)
Epidermal Cyst  An epidermal cyst is a small, painless lump under your skin. The cyst contains a grayish-white, bad-smelling substance (keratin). Do not try to pop or open an epidermal cyst yourself. What are the causes?  A blocked hair follicle.  A hair that curls and re-enters the skin instead of growing straight out of the skin.  A blocked pore.  Irritated skin.  An injury to the skin.  Certain conditions that are passed along from parent to child (inherited).  Human papillomavirus (HPV).  Long-term sun damage to the skin. What increases the risk?  Having acne.  Being overweight.  Being 30-40 years old. What are the signs or symptoms? These cysts are usually harmless, but they can get infected. Symptoms of infection may include:  Redness.  Inflammation.  Tenderness.  Warmth.  Fever.  A grayish-white, bad-smelling substance drains from the cyst.  Pus drains from the cyst. How is this treated? In many cases, epidermal cysts go away on their own without treatment. If a cyst becomes infected, treatment may include:  Opening and draining the cyst, done by a doctor. After draining, you may need minor surgery to remove the rest of the cyst.  Antibiotic medicine.  Shots of medicines (steroids) that help to reduce inflammation.  Surgery to remove the cyst. Surgery may be done if the cyst: ? Becomes large. ? Bothers you. ? Has a chance of turning into cancer.  Do not try to open a cyst yourself. Follow these instructions at home:  Take over-the-counter and prescription medicines only as told by your doctor.  If you were prescribed an antibiotic medicine, take it it as told by your doctor. Do not stop using the antibiotic even if you start to feel better.  Keep the area around your cyst clean and dry.  Wear loose, dry clothing.  Avoid touching your cyst.  Check your cyst every day for signs of infection. Check for: ? Redness, swelling, or pain. ? Fluid  or blood. ? Warmth. ? Pus or a bad smell.  Keep all follow-up visits as told by your doctor. This is important. How is this prevented?  Wear clean, dry, clothing.  Avoid wearing tight clothing.  Keep your skin clean and dry. Take showers or baths every day. Contact a doctor if:  Your cyst has symptoms of infection.  Your condition does not improve or gets worse.  You have a cyst that looks different from other cysts you have had.  You have a fever. Get help right away if:  Redness spreads from the cyst into the area close by. Summary  An epidermal cyst is a sac made of skin tissue.  If a cyst becomes infected, treatment may include surgery to open and drain the cyst, or to remove it.  Take over-the-counter and prescription medicines only as told by your doctor.  Contact a doctor if your condition is not improving or is getting worse.  Keep all follow-up visits as told by your doctor. This is important. This information is not intended to replace advice given to you by your health care provider. Make sure you discuss any questions you have with your health care provider. Document Revised: 08/12/2018 Document Reviewed: 01/28/2018 Elsevier Patient Education  2020 Elsevier Inc.  

## 2019-11-29 NOTE — Progress Notes (Signed)
Javier [name]; [id number]; 08/18/1945   HPI Patient is a 74 year old black male who was referred to my care by Dr. Cindie Laroche for evaluation and treatment of a lump on his back.  He states it has been present for some time.  They recently increased in size and ruptured.  He was able to express some white fluid from it.  Since then, he has been placing alcohol on the wound and it seems to be shrinking in size.  He currently has 0 out of 10 pain. Past Medical History:  Diagnosis Date  . Cataract   . Hypertension     Past Surgical History:  Procedure Laterality Date  . COLONOSCOPY  11/19/2009   RMR: 1. Multiple small rectal polyps status post snare polypectomy. 2. Multiple left colon polyp status post snare removal . Poor prep compromised exam.   . COLONOSCOPY N/A 12/13/2014   Procedure: COLONOSCOPY;  Surgeon: Daneil Dolin, MD;  Location: [facility];  Service: Endoscopy;  Laterality: N/A;  945  . HERNIA REPAIR      Family History  Problem Relation Age of Onset  . Colon cancer Neg Hx   . Stroke Mother   . Heart attack Father     Current Outpatient Medications on File Prior to Visit  Medication Sig Dispense Refill  . amLODipine (NORVASC) 5 MG tablet Take 5 mg by mouth daily.     . cloNIDine (CATAPRES) 0.1 MG tablet Take by mouth.     . ezetimibe (ZETIA) 10 MG tablet Take by mouth.     . metoprolol succinate (TOPROL-XL) 50 MG 24 hr tablet Take 50 mg by mouth daily. Take with or immediately following a meal.     . naproxen (NAPROSYN) 500 MG tablet Take 500 mg by mouth 2 (two) times daily with a meal.     . olmesartan (BENICAR) 20 MG tablet Take 20 mg by mouth daily.     Marland Kitchen [place] 420 MG/3.5ML SOCT SMARTSIG:420 Milligram(s) SUB-Q Once a Month    . rosuvastatin (CRESTOR) 20 MG tablet Take 20 mg by mouth daily.     . sildenafil (VIAGRA) 100 MG tablet Take 100 mg by mouth daily as needed for erectile dysfunction.     No current facility-administered medications on  file prior to visit.    No Known Allergies  Social History   Substance and Sexual Activity  Alcohol Use Yes   Comment: Gin - twice per week    Social History   Tobacco Use  Smoking Status Former Smoker    Review of Systems  Constitutional: Negative.   HENT: Negative.   Eyes: Negative.   Respiratory: Negative.   Cardiovascular: Negative.   Gastrointestinal: Negative.   Genitourinary: Negative.   Musculoskeletal: Negative.   Skin: Negative.   Neurological: Negative.   Endo/Heme/Allergies: Negative.   Psychiatric/Behavioral: Negative.     Objective   Vitals:   11/29/19 1352  BP: 121/66  Pulse: 58  Resp: 12  Temp: 97.9 F (36.6 C)  SpO2: 93%    Physical Exam Vitals reviewed.  Constitutional:      Appearance: Normal appearance. He is normal weight.  HENT:     Head: Normocephalic and atraumatic.  Cardiovascular:     Rate and Rhythm: Normal rate and regular rhythm.     Heart sounds: Normal heart sounds. No murmur heard.  No friction rub. No gallop.   Pulmonary:     Effort: Pulmonary effort is normal. No respiratory distress.  Breath sounds: Normal breath sounds. No stridor. No wheezing, rhonchi or rales.  Skin:    General: Skin is warm and dry.     Comments: A 2 cm ovoid open wound is present in the upper mid back with sebum expressed.  Minimal erythema is noted.  Mild induration is noted.  Mild tenderness is noted when I was expressing additional sebum.  Neurological:     Mental Status: He is alert and oriented to person, place, and time.    Primary care notes reviewed Assessment  Sebaceous cyst, back, resolving Plan   As the wound is open and draining, no need for surgical intervention at this time.  Patient understands and agrees.  He will continue keeping the wound clean and dry with soap and water and will stop using alcohol on the wound.  Follow-up here if the cyst does not resolve.

## 2020-01-11 ENCOUNTER — Encounter: Payer: Self-pay | Admitting: Internal Medicine

## 2020-03-08 ENCOUNTER — Ambulatory Visit (INDEPENDENT_AMBULATORY_CARE_PROVIDER_SITE_OTHER): Payer: Self-pay | Admitting: *Deleted

## 2020-03-08 ENCOUNTER — Other Ambulatory Visit: Payer: Self-pay

## 2020-03-08 VITALS — Ht 71.0 in | Wt 159.2 lb

## 2020-03-08 DIAGNOSIS — Z86010 Personal history of colonic polyps: Secondary | ICD-10-CM

## 2020-03-08 MED ORDER — NA SULFATE-K SULFATE-MG SULF 17.5-3.13-1.6 GM/177ML PO SOLN: 1.0000 | Freq: Once | ORAL | 0 refills | Status: AC

## 2020-03-08 NOTE — Patient Instructions (Signed)
Javier [name]  Apr 01, 1946 MRN: [id number]     Procedure Date: 04/25/2020 Time to register: 9:30 am Place to register: [facility] Procedure Time: 10:30 am Scheduled provider: Dr. Gala Romney    PREPARATION FOR COLONOSCOPY WITH SUPREP BOWEL PREP KIT  Note: Suprep Bowel Prep Kit is a split-dose (2day) regimen. Consumption of BOTH 6-ounce bottles is required for a complete prep.  Please notify us immediately if you are diabetic, take iron supplements, or if you are on Coumadin or any other blood thinners.   Please hold the following medications: n/a                                                                                                                                                  2 DAYS BEFORE PROCEDURE:  DATE: 04/23/2020   DAY: Monday Begin clear liquid diet AFTER your lunch meal. NO SOLID FOODS after this point.  1 DAY BEFORE PROCEDURE:  DATE: 04/24/2020   DAY: Tuesday Continue clear liquids the entire day - NO SOLID FOOD.   Diabetic medications adjustments for today: n/a  At 6:00pm: Complete steps 1 through 4 below, using ONE (1) 6-ounce bottle, before going to bed. Step 1:  Pour ONE (1) 6-ounce bottle of SUPREP liquid into the mixing container.  Step 2:  Add cool drinking water to the 16 ounce line on the container and mix.  Note: Dilute the solution concentrate as directed prior to use. Step 3:  DRINK ALL the liquid in the container. Step 4:  You MUST drink an additional two (2) or more 16 ounce containers of water over the next one (1) hour.   Continue clear liquids.  DAY OF PROCEDURE:   DATE: 04/25/2020   DAY: Wednesday If you take medications for your heart, blood pressure, or breathing, you may take these medications.  Diabetic medications adjustments for today: n/a  5 hours before your procedure at :  5:30 am Step 1:  Pour ONE (1) 6-ounce bottle of SUPREP liquid into the mixing container.  Step 2:  Add cool drinking water to the 16 ounce line on  the container and mix.  Note: Dilute the solution concentrate as directed prior to use. Step 3:  DRINK ALL the liquid in the container. Step 4:  You MUST drink an additional two (2) or more 16 ounce containers of water over the next one (1) hour. You MUST complete the final glass of water at least 3 hours before your colonoscopy. Nothing by mouth past 7:30 am.  You may take your morning medications with sip of water unless we have instructed otherwise.    Please see below for Dietary Information.  CLEAR LIQUIDS INCLUDE:  Water Jello (NOT red in color)   Ice Popsicles (NOT red in color)   Tea (sugar ok, no milk/cream) Powdered fruit flavored drinks  Coffee (  sugar ok, no milk/cream) Gatorade/ Lemonade/ Kool-Aid  (NOT red in color)   Juice: apple, white grape, white cranberry Soft drinks  Clear bullion, consomme, broth (fat free beef/chicken/vegetable)  Carbonated beverages (any kind)  Strained chicken noodle soup Hard Candy   Remember: Clear liquids are liquids that will allow you to see your fingers on the other side of a clear glass. Be sure liquids are NOT red in color, and not cloudy, but CLEAR.  DO NOT EAT OR DRINK ANY OF THE FOLLOWING:  Dairy products of any kind   Cranberry juice Tomato juice / V8 juice   Grapefruit juice Orange juice     Red grape juice  Do not eat any solid foods, including such foods as: cereal, oatmeal, yogurt, fruits, vegetables, creamed soups, eggs, bread, crackers, pureed foods in a blender, etc.   HELPFUL HINTS FOR DRINKING PREP SOLUTION:   Make sure prep is extremely cold. Mix and refrigerate the the morning of the prep. You may also put in the freezer.   You may try mixing some Crystal Light or Country Time Lemonade if you prefer. Mix in small amounts; add more if necessary.  Try drinking through a straw  Rinse mouth with water or a mouthwash between glasses, to remove after-taste.  Try sipping on a cold beverage /ice/ popsicles between glasses  of prep.  Place a piece of sugar-free hard candy in mouth between glasses.  If you become nauseated, try consuming smaller amounts, or stretch out the time between glasses. Stop for 30-60 minutes, then slowly start back drinking.     OTHER INSTRUCTIONS  You will need a responsible adult at least 72 years of age to accompany you and drive you home. This person must remain in the waiting room during your procedure. The hospital will cancel your procedure if you do not have a responsible adult with you.   1. Wear loose fitting clothing that is easily removed. 2. Leave jewelry and other valuables at home.  3. Remove all body piercing jewelry and leave at home. 4. Total time from sign-in until discharge is approximately 2-3 hours. 5. You should go home directly after your procedure and rest. You can resume normal activities the day after your procedure. 6. The day of your procedure you should not:  Drive  Make legal decisions  Operate machinery  Drink alcohol  Return to work   You may call the office (Dept: [phone]) before 5:00pm, or page the doctor on call [phone]) after 5:00pm, for further instructions, if necessary.   Insurance Information YOU WILL NEED TO CHECK WITH YOUR INSURANCE COMPANY FOR THE BENEFITS OF COVERAGE YOU HAVE FOR THIS PROCEDURE.  UNFORTUNATELY, NOT ALL INSURANCE COMPANIES HAVE BENEFITS TO COVER ALL OR PART OF THESE TYPES OF PROCEDURES.  IT IS YOUR RESPONSIBILITY TO CHECK YOUR BENEFITS, HOWEVER, WE WILL BE GLAD TO ASSIST YOU WITH ANY CODES YOUR INSURANCE COMPANY MAY NEED.    PLEASE NOTE THAT MOST INSURANCE COMPANIES WILL NOT COVER A SCREENING COLONOSCOPY FOR PEOPLE UNDER THE AGE OF 50  IF YOU HAVE BCBS INSURANCE, YOU MAY HAVE BENEFITS FOR A SCREENING COLONOSCOPY BUT IF POLYPS ARE FOUND THE DIAGNOSIS WILL CHANGE AND THEN YOU MAY HAVE A DEDUCTIBLE THAT WILL NEED TO BE MET. SO PLEASE MAKE SURE YOU CHECK YOUR BENEFITS FOR A SCREENING COLONOSCOPY AS WELL AS  A DIAGNOSTIC COLONOSCOPY.

## 2020-03-08 NOTE — Progress Notes (Signed)
Gastroenterology Pre-Procedure Review  Request Date: 03/08/2020 Requesting Physician: 5 year recall, Last TCS 12/13/2014 done by Dr. Gala Romney, colonic diverticulosis, hx colonic adenoma 2011  PATIENT REVIEW QUESTIONS: The patient responded to the following health history questions as indicated:    1. Diabetes Melitis: no 2. Joint replacements in the past 12 months: no 3. Major health problems in the past 3 months: no 4. Has an artificial valve or MVP: no 5. Has a defibrillator: no 6. Has been advised in past to take antibiotics in advance of a procedure like teeth cleaning: no 7. Family history of colon cancer: no  8. Alcohol Use: no 9. Illicit drug Use: no 10. History of sleep apnea: no  11. History of coronary artery or other vascular stents placed within the last 12 months: no 12. History of any prior anesthesia complications: no 13. Body mass index is 22.2 kg/m.    MEDICATIONS & ALLERGIES:    Patient reports the following regarding taking any blood thinners:   Plavix? no Aspirin? no Coumadin? no Brilinta? no Xarelto? no Eliquis? no Pradaxa? no Savaysa? no Effient? no  Patient confirms/reports the following medications:  Current Outpatient Medications  Medication Sig Dispense Refill  . amLODipine (NORVASC) 5 MG tablet Take 5 mg by mouth daily.     . cloNIDine (CATAPRES) 0.1 MG tablet Take by mouth daily.     Marland Kitchen ezetimibe (ZETIA) 10 MG tablet Take by mouth daily.     . metoprolol succinate (TOPROL-XL) 50 MG 24 hr tablet Take 50 mg by mouth daily. Take with or immediately following a meal.     . naproxen (NAPROSYN) 500 MG tablet Take 500 mg by mouth 2 (two) times daily with a meal.     . olmesartan (BENICAR) 20 MG tablet Take 20 mg by mouth daily.     Marland Kitchen [place] 420 MG/3.5ML SOCT SMARTSIG:420 Milligram(s) SUB-Q Once a Month    . rosuvastatin (CRESTOR) 20 MG tablet Take 20 mg by mouth daily.      No current facility-administered medications for this visit.     Patient confirms/reports the following allergies:  No Known Allergies  No orders of the defined types were placed in this encounter.   AUTHORIZATION INFORMATION Primary Insurance: [employer] Medicare,  ID #: [id number],  Group #:[id number]  Pre-Cert / Josem Kaufmann required: No, not required  Secondary Insurance: Medicaid,  ID #: [id number] S Pre-Cert / Josem Kaufmann required: No, not required  SCHEDULE INFORMATION: Procedure has been scheduled as follows:  Date: 04/25/2020, Time: 10:30 Location: [facility] with Dr. Gala Romney  This Gastroenterology Pre-Precedure Review Form is being routed to the following provider(s): Neil Crouch, PA

## 2020-03-12 NOTE — Progress Notes (Signed)
Ok to schedule.

## 2020-03-12 NOTE — Progress Notes (Signed)
ASA II

## 2020-04-23 ENCOUNTER — Other Ambulatory Visit (HOSPITAL_COMMUNITY)
Admission: RE | Admit: 2020-04-23 | Discharge: 2020-04-23 | Disposition: A | Discharge status: Home / Self Care | Payer: Medicare Other | Source: Ambulatory Visit | Attending: Internal Medicine | Admitting: Internal Medicine

## 2020-04-23 ENCOUNTER — Other Ambulatory Visit: Payer: Self-pay

## 2020-04-23 DIAGNOSIS — Z01812 Encounter for preprocedural laboratory examination: Secondary | ICD-10-CM | POA: Insufficient documentation

## 2020-04-23 DIAGNOSIS — Z20822 Contact with and (suspected) exposure to covid-19: Secondary | ICD-10-CM | POA: Insufficient documentation

## 2020-04-23 LAB — SARS CORONAVIRUS 2 (TAT 6-24 HRS): SARS Coronavirus 2: NEGATIVE

## 2020-04-25 ENCOUNTER — Encounter (HOSPITAL_COMMUNITY): Payer: Self-pay | Admitting: Internal Medicine

## 2020-04-25 ENCOUNTER — Encounter (HOSPITAL_COMMUNITY)
Admission: RE | Disposition: A | Discharge status: Home / Self Care | Payer: Self-pay | Source: Home / Self Care | Attending: Internal Medicine

## 2020-04-25 ENCOUNTER — Other Ambulatory Visit: Payer: Self-pay

## 2020-04-25 ENCOUNTER — Ambulatory Visit (HOSPITAL_COMMUNITY)
Admission: RE | Admit: 2020-04-25 | Discharge: 2020-04-25 | Disposition: A | Discharge status: Home / Self Care | Payer: Medicare Other | Attending: Internal Medicine | Admitting: Internal Medicine

## 2020-04-25 DIAGNOSIS — Z79899 Other long term (current) drug therapy: Secondary | ICD-10-CM | POA: Insufficient documentation

## 2020-04-25 DIAGNOSIS — Z86010 Personal history of colonic polyps: Secondary | ICD-10-CM | POA: Diagnosis not present

## 2020-04-25 DIAGNOSIS — Z1211 Encounter for screening for malignant neoplasm of colon: Secondary | ICD-10-CM | POA: Diagnosis not present

## 2020-04-25 DIAGNOSIS — Z87891 Personal history of nicotine dependence: Secondary | ICD-10-CM | POA: Insufficient documentation

## 2020-04-25 DIAGNOSIS — Z791 Long term (current) use of non-steroidal anti-inflammatories (NSAID): Secondary | ICD-10-CM | POA: Insufficient documentation

## 2020-04-25 HISTORY — PX: FLEXIBLE SIGMOIDOSCOPY: SHX5431

## 2020-04-25 SURGERY — SIGMOIDOSCOPY, FLEXIBLE
Anesthesia: Moderate Sedation

## 2020-04-25 MED ORDER — ONDANSETRON HCL 4 MG/2ML IJ SOLN
INTRAMUSCULAR | Status: AC
  Filled 2020-04-25: qty 2

## 2020-04-25 MED ORDER — STERILE WATER FOR IRRIGATION IR SOLN
Status: DC | PRN
  Administered 2020-04-25: 10:00:00 500 mL

## 2020-04-25 MED ORDER — ONDANSETRON HCL 4 MG/2ML IJ SOLN
INTRAMUSCULAR | Status: DC | PRN
  Administered 2020-04-25: 4 mg via INTRAVENOUS

## 2020-04-25 MED ORDER — MEPERIDINE HCL 50 MG/ML IJ SOLN
INTRAMUSCULAR | Status: AC
  Filled 2020-04-25: qty 1

## 2020-04-25 MED ORDER — MIDAZOLAM HCL 5 MG/5ML IJ SOLN
INTRAMUSCULAR | Status: DC | PRN
  Administered 2020-04-25: 2 mg via INTRAVENOUS

## 2020-04-25 MED ORDER — MIDAZOLAM HCL 5 MG/5ML IJ SOLN
INTRAMUSCULAR | Status: AC
  Filled 2020-04-25: qty 10

## 2020-04-25 MED ORDER — SIMETHICONE 40 MG/0.6ML PO SUSP
ORAL | Status: AC
  Filled 2020-04-25: qty 0.6

## 2020-04-25 MED ORDER — SODIUM CHLORIDE 0.9 % IV SOLN: INTRAVENOUS | Status: DC

## 2020-04-25 MED ORDER — MEPERIDINE HCL 100 MG/ML IJ SOLN
INTRAMUSCULAR | Status: DC | PRN
  Administered 2020-04-25: 25 mg via INTRAVENOUS

## 2020-04-25 NOTE — Discharge Instructions (Signed)
  Colonoscopy Discharge Instructions  Read the instructions outlined below and refer to this sheet in the next few weeks. These discharge instructions provide you with general information on caring for yourself after you leave the hospital. Your doctor may also give you specific instructions. While your treatment has been planned according to the most current medical practices available, unavoidable complications occasionally occur. If you have any problems or questions after discharge, call Dr. Gala Romney at [phone]. ACTIVITY  You may resume your regular activity, but move at a slower pace for the next 24 hours.   Take frequent rest periods for the next 24 hours.   Walking will help get rid of the air and reduce the bloated feeling in your belly (abdomen).   No driving for 24 hours (because of the medicine (anesthesia) used during the test).    Do not sign any important legal documents or operate any machinery for 24 hours (because of the anesthesia used during the test).  NUTRITION  Drink plenty of fluids.   You may resume your normal diet as instructed by your doctor.   Begin with a light meal and progress to your normal diet. Heavy or fried foods are harder to digest and may make you feel sick to your stomach (nauseated).   Avoid alcoholic beverages for 24 hours or as instructed.  MEDICATIONS  You may resume your normal medications unless your doctor tells you otherwise.  WHAT YOU CAN EXPECT TODAY  Some feelings of bloating in the abdomen.   Passage of more gas than usual.   Spotting of blood in your stool or on the toilet paper.  IF YOU HAD POLYPS REMOVED DURING THE COLONOSCOPY:  No aspirin products for 7 days or as instructed.   No alcohol for 7 days or as instructed.   Eat a soft diet for the next 24 hours.  FINDING OUT THE RESULTS OF YOUR TEST Not all test results are available during your visit. If your test results are not back during the visit, make an appointment  with your caregiver to find out the results. Do not assume everything is normal if you have not heard from your caregiver or the medical facility. It is important for you to follow up on all of your test results.  SEEK IMMEDIATE MEDICAL ATTENTION IF:  You have more than a spotting of blood in your stool.   Your belly is swollen (abdominal distention).   You are nauseated or vomiting.   You have a temperature over 101.   You have abdominal pain or discomfort that is severe or gets worse throughout the day.   Your colon was not at all cleaned out for the procedure and consequently the colonoscopy could not be done today.  Return office visit in January.

## 2020-04-25 NOTE — H&P (Addendum)
@LOGO @   Primary Care Physician:  Lucia Gaskins, MD Primary Gastroenterologist:  Dr. Gala Romney  Pre-Procedure History & Physical: HPI:  Javier [name] is a 74 y.o. male here for surveillance colonoscopy.  History of colonic adenoma removed 2001.  Negative colonoscopy 2016.  No bowel symptoms.  Past Medical History:  Diagnosis Date  . Cataract   . Hypertension     Past Surgical History:  Procedure Laterality Date  . COLONOSCOPY  11/19/2009   RMR: 1. Multiple small rectal polyps status post snare polypectomy. 2. Multiple left colon polyp status post snare removal . Poor prep compromised exam.   . COLONOSCOPY N/A 12/13/2014   Procedure: COLONOSCOPY;  Surgeon: Daneil Dolin, MD;  Location: [facility];  Service: Endoscopy;  Laterality: N/A;  945  . HERNIA REPAIR      Prior to Admission medications   Medication Sig Start Date End Date Taking? Authorizing Provider  amLODipine (NORVASC) 5 MG tablet Take 5 mg by mouth daily.    Yes [provider]  cloNIDine (CATAPRES) 0.1 MG tablet Take by mouth daily.  06/24/12  Yes [provider]  ezetimibe (ZETIA) 10 MG tablet Take by mouth daily.    Yes [provider]  metoprolol succinate (TOPROL-XL) 50 MG 24 hr tablet Take 50 mg by mouth daily. Take with or immediately following a meal.    Yes [provider]  naproxen (NAPROSYN) 500 MG tablet Take 500 mg by mouth 2 (two) times daily with a meal.    Yes [provider]  olmesartan (BENICAR) 20 MG tablet Take 20 mg by mouth daily.    Yes [provider]  [place] 420 MG/3.5ML SOCT SMARTSIG:420 Milligram(s) SUB-Q Once a Month 11/09/19  Yes [provider]  rosuvastatin (CRESTOR) 20 MG tablet Take 20 mg by mouth daily.    Yes [provider]    Allergies as of 03/12/2020  . (No Known Allergies)    Family History  Problem Relation Age of Onset  . Colon cancer Neg Hx   . Stroke Mother   . Heart attack  Father     Social History   Socioeconomic History  . Marital status: Married    Spouse name: Not on file  . Number of children: Not on file  . Years of education: Not on file  . Highest education level: Not on file  Occupational History  . Not on file  Tobacco Use  . Smoking status: Former Research scientist (life sciences)  . Smokeless tobacco: Never Used  Vaping Use  . Vaping Use: Never used  Substance and Sexual Activity  . Alcohol use: Yes    Comment: Gin - twice per week  . Drug use: No  . Sexual activity: Not on file  Other Topics Concern  . Not on file  Social History Narrative  . Not on file   Social Determinants of Health   Financial Resource Strain: Not on file  Food Insecurity: Not on file  Transportation Needs: Not on file  Physical Activity: Not on file  Stress: Not on file  Social Connections: Not on file  Intimate Partner Violence: Not on file    Review of Systems: See HPI, otherwise negative ROS  Physical Exam: BP 129/83   Pulse 86   Temp 97.7 F (36.5 C) (Oral)   Resp 12   Ht 5\' 11"  (1.803 m)   Wt 76.2 kg   SpO2 99%   BMI 23.43 kg/m  General:   Alert,  Well-developed, well-nourished, pleasant and cooperative in NAD Neck:  Supple; no masses or thyromegaly. No significant cervical adenopathy. Lungs:  Clear throughout to auscultation.   No wheezes, crackles, or rhonchi. No acute distress. Heart:  Regular rate and rhythm; no murmurs, clicks, rubs,  or gallops. Abdomen: Non-distended, normal bowel sounds.  Soft and nontender without appreciable mass or hepatosplenomegaly.  Pulses:  Normal pulses noted. Extremities:  Without clubbing or edema.  Impression/Plan: 74 year old gentleman with a distant history colonic adenoma; here for surveillance colonoscopy per plan. The risks, benefits, limitations, alternatives and imponderables have been reviewed with the patient. Questions have been answered. All parties are agreeable.      Notice: This dictation was prepared with  Dragon dictation along with smaller phrase technology. Any transcriptional errors that result from this process are unintentional and may not be corrected upon review.

## 2020-04-25 NOTE — Op Note (Signed)
[facility] Patient Name: Javier [name] Procedure Date: 04/25/2020 9:43 AM MRN: [id number] Date of Birth: Oct 02, 1945 Attending MD: Norvel Richards , MD CSN: [id number] Age: 74 Admit Type: Outpatient Procedure:                Attempted colonoscopy (sigmoidoscopy) Indications:              High risk colon cancer surveillance: Personal                            history of colonic polyps Providers:                Norvel Richards, MD, Lurline Del, RN, Wynonia Musty Tech, Technician Referring MD:              Medicines:                Midazolam 2 mg IV, Meperidine 25 mg IV Complications:            No immediate complications. Estimated Blood Loss:     Estimated blood loss: none. Procedure:                Pre-Anesthesia Assessment:                           - Prior to the procedure, a History and Physical                            was performed, and patient medications and                            allergies were reviewed. The patient's tolerance of                            previous anesthesia was also reviewed. The risks                            and benefits of the procedure and the sedation                            options and risks were discussed with the patient.                            All questions were answered, and informed consent                            was obtained. Prior Anticoagulants: The patient has                            taken no previous anticoagulant or antiplatelet                            agents. ASA Grade Assessment: II - A patient with  mild systemic disease. After reviewing the risks                            and benefits, the patient was deemed in                            satisfactory condition to undergo the procedure.                           After obtaining informed consent, the colonoscope                            was passed under direct vision. Throughout the                             procedure, the patient's blood pressure, pulse, and                            oxygen saturations were monitored continuously. The                            [id number] scope was introduced through                            the anus and advanced to the the sigmoid colon. The                            colonoscopy was performed without difficulty. The                            patient tolerated the procedure well. The quality                            of the bowel preparation was inadequate. Scope In: 10:11:28 AM Scope Out: 10:12:14 AM Total Procedure Duration: 0 hours 0 minutes 46 seconds  Findings:      The perianal and digital rectal examinations were normal. Formed stool       in the rectum. Rectosigmoid lumen was occluded with formed stool.       Procedure aborted. Impression:               - Preparation of the colon was inadequate.                            Procedure aborted.                           - No specimens collected. Moderate Sedation:      Moderate (conscious) sedation was administered by the endoscopy nurse       and supervised by the endoscopist. The following parameters were       monitored: oxygen saturation, heart rate, blood pressure, respiratory       rate, EKG, adequacy of pulmonary ventilation, and response to care.       Total physician intraservice time was 5 minutes. Recommendation:           -  Patient has a contact number available for                            emergencies. The signs and symptoms of potential                            delayed complications were discussed with the                            patient. Return to normal activities tomorrow.                            Written discharge instructions were provided to the                            patient.                           - Advance diet as tolerated.                           - Continue present medications.                           - Repeat  colonoscopy within 3 months for                            surveillance.                           - Return to GI office in 1 month. Procedure Code(s):        --- Professional ---                           [id number], 48, Colonoscopy, flexible; diagnostic,                            including collection of specimen(s) by brushing or                            washing, when performed (separate procedure) Diagnosis Code(s):        --- Professional ---                           Z86.010, Personal history of colonic polyps CPT copyright 2019 American Medical [employer]. All rights reserved. The codes documented in this report are preliminary and upon coder review may  be revised to meet current compliance requirements. Javier [name]. Javier Faivre, MD Norvel Richards, MD 04/25/2020 10:19:58 AM This report has been signed electronically. Number of Addenda: 0

## 2020-05-03 ENCOUNTER — Encounter (HOSPITAL_COMMUNITY): Payer: Self-pay | Admitting: Internal Medicine

## 2020-06-02 NOTE — Progress Notes (Signed)
Referring Provider: Lucia Gaskins, MD Primary Care Physician:  Lucia Gaskins, MD Primary GI Physician: Dr. Gala Romney  Chief Complaint  Patient presents with  . Colonoscopy    Poor prep    HPI:   Javier [name] is a 75 y.o. male presenting today to discuss rescheduling colonoscopy. Patient has history of adenomatous colon polyps. He was triaged for colonoscopy in November 2021. Colonoscopy attempted 04/26/2020 with formed stool in the rectum and rectosigmoid lumen was occluded with formed stool, so procedure was aborted. Recommend repeat colonoscopy in 3 months with office visit to arrange.  Today: Only completed 1/2 of colon prep because the diarrhea caused rectal discomfort. Not sure if he completed the first half of the prep correctly.  Patient states he did not like this prep.  He is not sure about mixing the contents.  He is used to [employer] and prefers this prep. States he did follow clear liquid diet appropriately.   No history of constipation.  BMs daily. No diarrhea. No abdominal pain or rectal pain. Weight is stable. No blood in the stool or black stools. No nausea or vomiting. No GERD symptoms or dysphagia.   Past Medical History:  Diagnosis Date  . Cataract   . HLD (hyperlipidemia)   . Hypertension     Past Surgical History:  Procedure Laterality Date  . COLONOSCOPY  11/19/2009   RMR: 1. Multiple small rectal polyps status post snare polypectomy. 2. Multiple left colon polyp status post snare removal . Poor prep compromised exam.   . COLONOSCOPY N/A 12/13/2014   Procedure: COLONOSCOPY;  Surgeon: Daneil Dolin, MD;  Location: [facility];  Service: Endoscopy;  Laterality: N/A;  945  . FLEXIBLE SIGMOIDOSCOPY N/A 04/25/2020   Procedure: FLEXIBLE SIGMOIDOSCOPY;  Surgeon: Daneil Dolin, MD; incomplete colonoscopy with flex sig only due to poor prep.  Marland Kitchen HERNIA REPAIR      Current Outpatient Medications  Medication Sig Dispense Refill  . amLODipine  (NORVASC) 5 MG tablet Take 5 mg by mouth daily.     . cloNIDine (CATAPRES) 0.1 MG tablet Take by mouth daily.     Marland Kitchen ezetimibe (ZETIA) 10 MG tablet Take by mouth daily.     . metoprolol succinate (TOPROL-XL) 50 MG 24 hr tablet Take 50 mg by mouth daily. Take with or immediately following a meal.     . naproxen (NAPROSYN) 500 MG tablet Take 500 mg by mouth 2 (two) times daily with a meal.     . olmesartan (BENICAR) 20 MG tablet Take 20 mg by mouth daily.     Marland Kitchen [place] 420 MG/3.5ML SOCT SMARTSIG:420 Milligram(s) SUB-Q Once a Month    . rosuvastatin (CRESTOR) 20 MG tablet Take 20 mg by mouth daily.     . polyethylene glycol-electrolytes (TRILYTE) 420 g solution Take 4,000 mLs by mouth as directed. 4000 mL 0   No current facility-administered medications for this visit.    Allergies as of 06/04/2020  . (No Known Allergies)    Family History  Problem Relation Age of Onset  . Colon cancer Neg Hx   . Stroke Mother   . Heart attack Father     Social History   Socioeconomic History  . Marital status: Married    Spouse name: Not on file  . Number of children: Not on file  . Years of education: Not on file  . Highest education level: Not on file  Occupational History  . Not on file  Tobacco  Use  . Smoking status: Former Smoker  . Smokeless tobacco: Never Used  Vaping Use  . Vaping Use: Never used  Substance and Sexual Activity  . Alcohol use: Yes    Comment: Gin - twice per week; no Gin currently. Beer occasionally.   . Drug use: No  . Sexual activity: Not on file  Other Topics Concern  . Not on file  Social History Narrative  . Not on file   Social Determinants of Health   Financial Resource Strain: Not on file  Food Insecurity: Not on file  Transportation Needs: Not on file  Physical Activity: Not on file  Stress: Not on file  Social Connections: Not on file    Review of Systems: Gen: Denies fever, chills, cold or flulike symptoms, lightheadedness,  dizziness, presyncope, syncope. CV: Denies chest pain or palpitations. Resp: Denies dyspnea.  Chronic intermittent cough. GI: See HPI Heme: See HPI.  Physical Exam: BP 128/73   Pulse (!) 55   Temp (!) 96.8 F (36 C) (Temporal)   Ht 5\' 11"  (1.803 m)   Wt 162 lb 9.6 oz (73.8 kg)   BMI 22.68 kg/m  General:   Alert and oriented. No distress noted. Pleasant and cooperative.  Head:  Normocephalic and atraumatic. Eyes:  Conjuctiva clear without scleral icterus. Heart:  S1, S2 present without murmurs appreciated. Lungs:  Clear to auscultation bilaterally. No wheezes, rales, or rhonchi. No distress.  Abdomen:  +BS, soft, non-tender and non-distended. No rebound or guarding. No HSM or masses noted. Msk:  Symmetrical without gross deformities. Normal posture. Extremities:  Without edema. Neurologic:  Alert and  oriented x4. Query underlying mental delay/mild dimentia. Psych: Normal mood.  Flat affect.

## 2020-06-04 ENCOUNTER — Ambulatory Visit (INDEPENDENT_AMBULATORY_CARE_PROVIDER_SITE_OTHER): Payer: Medicare Other | Admitting: Gastroenterology

## 2020-06-04 ENCOUNTER — Encounter: Payer: Self-pay | Admitting: Gastroenterology

## 2020-06-04 ENCOUNTER — Other Ambulatory Visit: Payer: Self-pay

## 2020-06-04 VITALS — BP 128/73 | HR 55 | Temp 96.8°F | Ht 71.0 in | Wt 162.6 lb

## 2020-06-04 DIAGNOSIS — Z86010 Personal history of colonic polyps: Secondary | ICD-10-CM | POA: Diagnosis not present

## 2020-06-04 MED ORDER — PEG 3350-KCL-NA BICARB-NACL 420 G PO SOLR: 4000.0000 mL | ORAL | 0 refills | Status: DC

## 2020-06-04 NOTE — Patient Instructions (Addendum)
We will arrange for you to have a colonoscopy in the near future with Dr. Gala Romney.  Please see separate instructions for your colon prep.  Ensure that you follow all instructions exactly as written. You will have diarrhea while completing the colon prep. This is normal as we have to clean your colon out in order to see during the colonoscopy.   If you have any questions while completing the colon prep, please call our office and we will help walk you through the instructions again.  Our office number is [phone].  To help with rectal soreness while completing the colon prep, you may apply a protective barrier to the outside of your rectum such as zinc oxide, A&D ointment, or petroleum jelly to your rectum after bowel movements to keep this area protected.  We will plan to follow-up with you in the office as needed.  Aliene Altes, PA-C [facility]

## 2020-06-05 ENCOUNTER — Encounter: Payer: Self-pay | Admitting: Gastroenterology

## 2020-06-05 NOTE — Assessment & Plan Note (Addendum)
75 year old male with history of adenomatous colon polyps presenting today to reschedule surveillance colonoscopy.  Attempted colonoscopy 04/26/2020; however, patient had poor prep, certainly flex sig was completed.  After discussion with patient today, patient only completed one half bowel prep due to rectal discomfort with frequent bowel movements caused by the colon prep.  No ongoing rectal pain.  No significant upper or lower GI symptoms.  No alarm symptoms.  No family history of colon cancer.  I had a long discussion with patient today regarding colon prep.  Query whether he has underlying mental delay/early dementia.  Patient states he does not like the last prep he had as he had difficulty following instructions that required mixing.  He remembers TriLyte and prefers this colon prep.  Instructions were whenever extensively and I requested patient to give our office a call if he has any questions while he is completing his bowel prep in the future. We will attempt colonoscopy once more. If poor prep, this is a patient that may need hospital admission to assist with bowel prep.  However, we would not be able to accommodate this currently with COVID-19 surge.  Plan: Proceed with colonoscopy with Dr. Gala Romney in the near future. The risks, benefits, and alternatives have been discussed with the patient in detail. The patient states understanding and desires to proceed.  ASA II We will plan for 2 days of clear liquids. He will complete TriLyte prep as this is straightforward and patient remembers completing this prep in the past. Requested he call our office with any questions while completing his bowel prep. Advised patient that he may apply over-the-counter skin barrier ointment (zinc oxide, A&E, petroleum jelly) to his outer rectum while completing his colon prep to prevent rectal discomfort. Follow-up as needed.

## 2020-07-16 ENCOUNTER — Other Ambulatory Visit (HOSPITAL_COMMUNITY)
Admission: RE | Admit: 2020-07-16 | Discharge: 2020-07-16 | Disposition: A | Discharge status: Home / Self Care | Payer: Medicare Other | Source: Ambulatory Visit | Attending: Internal Medicine | Admitting: Internal Medicine

## 2020-07-16 ENCOUNTER — Other Ambulatory Visit: Payer: Self-pay

## 2020-07-16 DIAGNOSIS — Z01812 Encounter for preprocedural laboratory examination: Secondary | ICD-10-CM | POA: Diagnosis present

## 2020-07-16 DIAGNOSIS — Z20822 Contact with and (suspected) exposure to covid-19: Secondary | ICD-10-CM | POA: Diagnosis not present

## 2020-07-16 LAB — SARS CORONAVIRUS 2 (TAT 6-24 HRS): SARS Coronavirus 2: NEGATIVE

## 2020-07-18 ENCOUNTER — Other Ambulatory Visit: Payer: Self-pay

## 2020-07-18 ENCOUNTER — Ambulatory Visit (HOSPITAL_COMMUNITY)
Admission: RE | Admit: 2020-07-18 | Discharge: 2020-07-18 | Disposition: A | Discharge status: Home / Self Care | Payer: Medicare Other | Source: Ambulatory Visit | Attending: Internal Medicine | Admitting: Internal Medicine

## 2020-07-18 ENCOUNTER — Encounter (HOSPITAL_COMMUNITY): Payer: Self-pay | Admitting: Internal Medicine

## 2020-07-18 ENCOUNTER — Encounter (HOSPITAL_COMMUNITY)
Admission: RE | Disposition: A | Discharge status: Home / Self Care | Payer: Self-pay | Source: Ambulatory Visit | Attending: Internal Medicine

## 2020-07-18 DIAGNOSIS — Z8249 Family history of ischemic heart disease and other diseases of the circulatory system: Secondary | ICD-10-CM | POA: Diagnosis not present

## 2020-07-18 DIAGNOSIS — Z86010 Personal history of colonic polyps: Secondary | ICD-10-CM | POA: Diagnosis not present

## 2020-07-18 DIAGNOSIS — Z87891 Personal history of nicotine dependence: Secondary | ICD-10-CM | POA: Diagnosis not present

## 2020-07-18 DIAGNOSIS — D122 Benign neoplasm of ascending colon: Secondary | ICD-10-CM | POA: Diagnosis not present

## 2020-07-18 DIAGNOSIS — Z823 Family history of stroke: Secondary | ICD-10-CM | POA: Insufficient documentation

## 2020-07-18 DIAGNOSIS — I10 Essential (primary) hypertension: Secondary | ICD-10-CM | POA: Insufficient documentation

## 2020-07-18 DIAGNOSIS — Z79899 Other long term (current) drug therapy: Secondary | ICD-10-CM | POA: Diagnosis not present

## 2020-07-18 DIAGNOSIS — K5730 Diverticulosis of large intestine without perforation or abscess without bleeding: Secondary | ICD-10-CM | POA: Diagnosis not present

## 2020-07-18 DIAGNOSIS — Z791 Long term (current) use of non-steroidal anti-inflammatories (NSAID): Secondary | ICD-10-CM | POA: Insufficient documentation

## 2020-07-18 DIAGNOSIS — K635 Polyp of colon: Secondary | ICD-10-CM | POA: Diagnosis not present

## 2020-07-18 DIAGNOSIS — Z1211 Encounter for screening for malignant neoplasm of colon: Secondary | ICD-10-CM | POA: Diagnosis present

## 2020-07-18 HISTORY — PX: POLYPECTOMY: SHX5525

## 2020-07-18 HISTORY — PX: COLONOSCOPY: SHX5424

## 2020-07-18 SURGERY — COLONOSCOPY
Anesthesia: Moderate Sedation

## 2020-07-18 MED ORDER — MEPERIDINE HCL 50 MG/ML IJ SOLN
INTRAMUSCULAR | Status: AC
  Filled 2020-07-18: qty 1

## 2020-07-18 MED ORDER — MIDAZOLAM HCL 5 MG/5ML IJ SOLN
INTRAMUSCULAR | Status: AC
  Filled 2020-07-18: qty 10

## 2020-07-18 MED ORDER — ONDANSETRON HCL 4 MG/2ML IJ SOLN
INTRAMUSCULAR | Status: AC
  Filled 2020-07-18: qty 2

## 2020-07-18 MED ORDER — MIDAZOLAM HCL 5 MG/5ML IJ SOLN
INTRAMUSCULAR | Status: DC | PRN
  Administered 2020-07-18 (×3): 1 mg via INTRAVENOUS
  Administered 2020-07-18: 2 mg via INTRAVENOUS
  Administered 2020-07-18: 1 mg via INTRAVENOUS

## 2020-07-18 MED ORDER — SODIUM CHLORIDE 0.9 % IV SOLN
INTRAVENOUS | Status: DC
  Administered 2020-07-18: 1000 mL via INTRAVENOUS

## 2020-07-18 MED ORDER — MEPERIDINE HCL 100 MG/ML IJ SOLN
INTRAMUSCULAR | Status: DC | PRN
  Administered 2020-07-18: 25 mg
  Administered 2020-07-18: 10 mg
  Administered 2020-07-18: 15 mg

## 2020-07-18 MED ORDER — ONDANSETRON HCL 4 MG/2ML IJ SOLN
INTRAMUSCULAR | Status: DC | PRN
  Administered 2020-07-18: 4 mg via INTRAVENOUS

## 2020-07-18 MED ORDER — STERILE WATER FOR IRRIGATION IR SOLN
Status: DC | PRN
  Administered 2020-07-18: 100 mL

## 2020-07-18 NOTE — Discharge Instructions (Signed)
Colonoscopy Discharge Instructions  Read the instructions outlined below and refer to this sheet in the next few weeks. These discharge instructions provide you with general information on caring for yourself after you leave the hospital. Your doctor may also give you specific instructions. While your treatment has been planned according to the most current medical practices available, unavoidable complications occasionally occur. If you have any problems or questions after discharge, call Dr. Gala Romney at [phone]. ACTIVITY  You may resume your regular activity, but move at a slower pace for the next 24 hours.   Take frequent rest periods for the next 24 hours.   Walking will help get rid of the air and reduce the bloated feeling in your belly (abdomen).   No driving for 24 hours (because of the medicine (anesthesia) used during the test).    Do not sign any important legal documents or operate any machinery for 24 hours (because of the anesthesia used during the test).  NUTRITION  Drink plenty of fluids.   You may resume your normal diet as instructed by your doctor.   Begin with a light meal and progress to your normal diet. Heavy or fried foods are harder to digest and may make you feel sick to your stomach (nauseated).   Avoid alcoholic beverages for 24 hours or as instructed.  MEDICATIONS  You may resume your normal medications unless your doctor tells you otherwise.  WHAT YOU CAN EXPECT TODAY  Some feelings of bloating in the abdomen.   Passage of more gas than usual.   Spotting of blood in your stool or on the toilet paper.  IF YOU HAD POLYPS REMOVED DURING THE COLONOSCOPY:  No aspirin products for 7 days or as instructed.   No alcohol for 7 days or as instructed.   Eat a soft diet for the next 24 hours.  FINDING OUT THE RESULTS OF YOUR TEST Not all test results are available during your visit. If your test results are not back during the visit, make an appointment  with your caregiver to find out the results. Do not assume everything is normal if you have not heard from your caregiver or the medical facility. It is important for you to follow up on all of your test results.  SEEK IMMEDIATE MEDICAL ATTENTION IF:  You have more than a spotting of blood in your stool.   Your belly is swollen (abdominal distention).   You are nauseated or vomiting.   You have a temperature over 101.   You have abdominal pain or discomfort that is severe or gets worse throughout the day.   4 polyps removed in your colon today  Diverticulosis and colon polyp information provided  Further recommendations to follow pending review of pathology report  At patient request, I called [facility] at [phone]  -reviewed results   Diverticulosis  Diverticulosis is a condition that develops when small pouches (diverticula) form in the wall of the large intestine (colon). The colon is where water is absorbed and stool (feces) is formed. The pouches form when the inside layer of the colon pushes through weak spots in the outer layers of the colon. You may have a few pouches or many of them. The pouches usually do not cause problems unless they become inflamed or infected. When this happens, the condition is called diverticulitis. What are the causes? The cause of this condition is not known. What increases the risk? The following factors may make you more likely to develop this condition:  Being older than age 62. Your risk for this condition increases with age. Diverticulosis is rare among people younger than age 50. By age 83, many people have it.  Eating a low-fiber diet.  Having frequent constipation.  Being overweight.  Not getting enough exercise.  Smoking.  Taking over-the-counter pain medicines, like aspirin and ibuprofen.  Having a family history of diverticulosis. What are the signs or symptoms? In most people, there are no symptoms of this condition. If you do  have symptoms, they may include:  Bloating.  Cramps in the abdomen.  Constipation or diarrhea.  Pain in the lower left side of the abdomen. How is this diagnosed? Because diverticulosis usually has no symptoms, it is most often diagnosed during an exam for other colon problems. The condition may be diagnosed by:  Using a flexible scope to examine the colon (colonoscopy).  Taking an X-ray of the colon after dye has been put into the colon (barium enema).  Having a CT scan. How is this treated? You may not need treatment for this condition. Your health care provider may recommend treatment to prevent problems. You may need treatment if you have symptoms or if you previously had diverticulitis. Treatment may include:  Eating a high-fiber diet.  Taking a fiber supplement.  Taking a live bacteria supplement (probiotic).  Taking medicine to relax your colon.   Follow these instructions at home: Medicines  Take over-the-counter and prescription medicines only as told by your health care provider.  If told by your health care provider, take a fiber supplement or probiotic. Constipation prevention Your condition may cause constipation. To prevent or treat constipation, you may need to:  Drink enough fluid to keep your urine pale yellow.  Take over-the-counter or prescription medicines.  Eat foods that are high in fiber, such as beans, whole grains, and fresh fruits and vegetables.  Limit foods that are high in fat and processed sugars, such as fried or sweet foods.   General instructions  Try not to strain when you have a bowel movement.  Keep all follow-up visits as told by your health care provider. This is important. Contact a health care provider if you:  Have pain in your abdomen.  Have bloating.  Have cramps.  Have not had a bowel movement in 3 days. Get help right away if:  Your pain gets worse.  Your bloating becomes very bad.  You have a fever or chills,  and your symptoms suddenly get worse.  You vomit.  You have bowel movements that are bloody or black.  You have bleeding from your rectum. Summary  Diverticulosis is a condition that develops when small pouches (diverticula) form in the wall of the large intestine (colon).  You may have a few pouches or many of them.  This condition is most often diagnosed during an exam for other colon problems.  Treatment may include increasing the fiber in your diet, taking supplements, or taking medicines. This information is not intended to replace advice given to you by your health care provider. Make sure you discuss any questions you have with your health care provider. Document Revised: 11/18/2018 Document Reviewed: 11/18/2018 Elsevier Patient Education  [place].  Colon Polyps  Colon polyps are tissue growths inside the colon, which is part of the large intestine. They are one of the types of polyps that can grow in the body. A polyp may be a round bump or a mushroom-shaped growth. You could have one polyp or more than  one. Most colon polyps are noncancerous (benign). However, some colon polyps can become cancerous over time. Finding and removing the polyps early can help prevent this. What are the causes? The exact cause of colon polyps is not known. What increases the risk? The following factors may make you more likely to develop this condition:  Having a family history of colorectal cancer or colon polyps.  Being older than 73 years of age.  Being younger than 73 years of age and having a significant family history of colorectal cancer or colon polyps or a genetic condition that puts you at higher risk of getting colon polyps.  Having inflammatory bowel disease, such as ulcerative colitis or Crohn's disease.  Having certain conditions passed from parent to child (hereditary conditions), such as: ? Familial adenomatous polyposis (FAP). ? Lynch syndrome. ? Turcot  syndrome. ? Peutz-Jeghers syndrome. ? MUTYH-associated polyposis (MAP).  Being overweight.  Certain lifestyle factors. These include smoking cigarettes, drinking too much alcohol, not getting enough exercise, and eating a diet that is high in fat and red meat and low in fiber.  Having had childhood cancer that was treated with radiation of the abdomen. What are the signs or symptoms? Many times, there are no symptoms. If you have symptoms, they may include:  Blood coming from the rectum during a bowel movement.  Blood in the stool (feces). The blood may be bright red or very dark in color.  Pain in the abdomen.  A change in bowel habits, such as constipation or diarrhea. How is this diagnosed? This condition is diagnosed with a colonoscopy. This is a procedure in which a lighted, flexible scope is inserted into the opening between the buttocks (anus) and then passed into the colon to examine the area. Polyps are sometimes found when a colonoscopy is done as part of routine cancer screening tests. How is this treated? This condition is treated by removing any polyps that are found. Most polyps can be removed during a colonoscopy. Those polyps will then be tested for cancer. Additional treatment may be needed depending on the results of testing. Follow these instructions at home: Eating and drinking  Eat foods that are high in fiber, such as fruits, vegetables, and whole grains.  Eat foods that are high in calcium and vitamin D, such as milk, cheese, yogurt, eggs, liver, fish, and broccoli.  Limit foods that are high in fat, such as fried foods and desserts.  Limit the amount of red meat, precooked or cured meat, or other processed meat that you eat, such as hot dogs, sausages, bacon, or meat loaves.  Limit sugary drinks.   Lifestyle  Maintain a healthy weight, or lose weight if recommended by your health care provider.  Exercise every day or as told by your health care  provider.  Do not use any products that contain nicotine or tobacco, such as cigarettes, e-cigarettes, and chewing tobacco. If you need help quitting, ask your health care provider.  Do not drink alcohol if: ? Your health care provider tells you not to drink. ? You are pregnant, may be pregnant, or are planning to become pregnant.  If you drink alcohol: ? Limit how much you use to:  0-1 drink a day for women.  0-2 drinks a day for men. ? Know how much alcohol is in your drink. In the [place] one drink equals one 12 oz bottle of beer (355 mL), one 5 oz glass of wine (148 mL), or one 1 oz glass of  hard liquor (44 mL). General instructions  Take over-the-counter and prescription medicines only as told by your health care provider.  Keep all follow-up visits. This is important. This includes having regularly scheduled colonoscopies. Talk to your health care provider about when you need a colonoscopy. Contact a health care provider if:  You have new or worsening bleeding during a bowel movement.  You have new or increased blood in your stool.  You have a change in bowel habits.  You lose weight for no known reason. Summary  Colon polyps are tissue growths inside the colon, which is part of the large intestine. They are one type of polyp that can grow in the body.  Most colon polyps are noncancerous (benign), but some can become cancerous over time.  This condition is diagnosed with a colonoscopy.  This condition is treated by removing any polyps that are found. Most polyps can be removed during a colonoscopy. This information is not intended to replace advice given to you by your health care provider. Make sure you discuss any questions you have with your health care provider. Document Revised: 08/10/2019 Document Reviewed: 08/10/2019 Elsevier Patient Education  2021 [employer].

## 2020-07-18 NOTE — H&P (Signed)
@LOGO @   Primary Care Physician:  Lucia Gaskins, MD Primary Gastroenterologist:  Dr. Gala Romney  Pre-Procedure History & Physical: HPI:  Javier [name] is a 75 y.o. male here for surveillance colonoscopy.  History of colonic polyps.  Attempt colonoscopy thwarted by poor prep December 2021.   Past Medical History:  Diagnosis Date  . Cataract   . HLD (hyperlipidemia)   . Hypertension     Past Surgical History:  Procedure Laterality Date  . COLONOSCOPY  11/19/2009   RMR: 1. Multiple small rectal polyps status post snare polypectomy. 2. Multiple left colon polyp status post snare removal . Poor prep compromised exam.   . COLONOSCOPY N/A 12/13/2014   Procedure: COLONOSCOPY;  Surgeon: Daneil Dolin, MD;  Location: [facility];  Service: Endoscopy;  Laterality: N/A;  945  . FLEXIBLE SIGMOIDOSCOPY N/A 04/25/2020   Procedure: FLEXIBLE SIGMOIDOSCOPY;  Surgeon: Daneil Dolin, MD; incomplete colonoscopy with flex sig only due to poor prep.  Marland Kitchen HERNIA REPAIR      Prior to Admission medications   Medication Sig Start Date End Date Taking? Authorizing Provider  polyethylene glycol-electrolytes (TRILYTE) 420 g solution Take 4,000 mLs by mouth as directed. 06/04/20  Yes Aida Lemaire, Cristopher Estimable, MD  [place] 420 MG/3.5ML SOCT SMARTSIG:420 Milligram(s) SUB-Q Once a Month 11/09/19  Yes [provider]  amLODipine (NORVASC) 5 MG tablet Take 5 mg by mouth daily.     [provider]  cloNIDine (CATAPRES) 0.1 MG tablet Take by mouth daily.  06/24/12   [provider]  ezetimibe (ZETIA) 10 MG tablet Take by mouth daily.     [provider]  metoprolol succinate (TOPROL-XL) 50 MG 24 hr tablet Take 50 mg by mouth daily. Take with or immediately following a meal.     [provider]  naproxen (NAPROSYN) 500 MG tablet Take 500 mg by mouth 2 (two) times daily with a meal.     [provider]  olmesartan (BENICAR) 20 MG tablet Take 20 mg by  mouth daily.     [provider]  rosuvastatin (CRESTOR) 20 MG tablet Take 20 mg by mouth daily.     [provider]    Allergies as of 06/05/2020  . (No Known Allergies)    Family History  Problem Relation Age of Onset  . Colon cancer Neg Hx   . Stroke Mother   . Heart attack Father     Social History   Socioeconomic History  . Marital status: Married    Spouse name: Not on file  . Number of children: Not on file  . Years of education: Not on file  . Highest education level: Not on file  Occupational History  . Not on file  Tobacco Use  . Smoking status: Former Research scientist (life sciences)  . Smokeless tobacco: Never Used  Vaping Use  . Vaping Use: Never used  Substance and Sexual Activity  . Alcohol use: Yes    Comment: Gin - twice per week; no Gin currently. Beer occasionally.   . Drug use: No  . Sexual activity: Not on file  Other Topics Concern  . Not on file  Social History Narrative  . Not on file   Social Determinants of Health   Financial Resource Strain: Not on file  Food Insecurity: Not on file  Transportation Needs: Not on file  Physical Activity: Not on file  Stress: Not on file  Social Connections: Not on file  Intimate Partner Violence: Not on file  Review of Systems: See HPI, otherwise negative ROS  Physical Exam: BP (!) 175/96   Pulse 84   Temp 97.7 F (36.5 C) (Oral)   Resp 17   Ht 5\' 11"  (1.803 m)   Wt 70.8 kg   SpO2 98%   BMI 21.76 kg/m  General:   Alert,  Well-developed, well-nourished, pleasant and cooperative in NAD Neck:  Supple; no masses or thyromegaly. No significant cervical adenopathy. Lungs:  Clear throughout to auscultation.   No wheezes, crackles, or rhonchi. No acute distress. Heart:  Regular rate and rhythm; no murmurs, clicks, rubs,  or gallops. Abdomen: Non-distended, normal bowel sounds.  Soft and nontender without appreciable mass or hepatosplenomegaly.  Pulses:  Normal pulses noted. Extremities:  Without  clubbing or edema.  Impression/Plan: 75 year old gentleman here for surveillance colonoscopy.  History of colonic adenoma removed previously. The risks, benefits, limitations, alternatives and imponderables have been reviewed with the patient. Questions have been answered. All parties are agreeable.      Notice: This dictation was prepared with Dragon dictation along with smaller phrase technology. Any transcriptional errors that result from this process are unintentional and may not be corrected upon review.

## 2020-07-18 NOTE — Op Note (Signed)
[facility] Patient Name: Javier [name] Procedure Date: 07/18/2020 8:53 AM MRN: [id number] Date of Birth: 03/26/1946 Attending MD: Norvel Richards , MD CSN: [id number] Age: 75 Admit Type: Outpatient Procedure:                Colonoscopy Indications:              High risk colon cancer surveillance: Personal                            history of colonic polyps Providers:                Norvel Richards, MD, Lambert Mody, Casimer Bilis, Technician Referring MD:              Medicines:                Midazolam 6 mg IV, Meperidine 50 mg IV Complications:            No immediate complications. Estimated Blood Loss:     Estimated blood loss was minimal. Procedure:                Pre-Anesthesia Assessment:                           - Prior to the procedure, a History and Physical                            was performed, and patient medications and                            allergies were reviewed. The patient's tolerance of                            previous anesthesia was also reviewed. The risks                            and benefits of the procedure and the sedation                            options and risks were discussed with the patient.                            All questions were answered, and informed consent                            was obtained. Prior Anticoagulants: The patient has                            taken no previous anticoagulant or antiplatelet                            agents. ASA Grade Assessment: III - A patient with  severe systemic disease. After reviewing the risks                            and benefits, the patient was deemed in                            satisfactory condition to undergo the procedure.                           After obtaining informed consent, the colonoscope                            was passed under direct vision. Throughout the                             procedure, the patient's blood pressure, pulse, and                            oxygen saturations were monitored continuously. The                            CF-HQ190L ([id number]) scope was introduced through                            the anus and advanced to the the cecum, identified                            by appendiceal orifice and ileocecal valve. The                            colonoscopy was performed without difficulty. The                            patient tolerated the procedure well. The quality                            of the bowel preparation was adequate. The                            ileocecal valve, appendiceal orifice, and rectum                            were photographed. The entire colon was well                            visualized. Scope In: 9:43:04 AM Scope Out: 9:57:00 AM Scope Withdrawal Time: 0 hours 8 minutes 49 seconds  Total Procedure Duration: 0 hours 13 minutes 56 seconds  Findings:      The perianal and digital rectal examinations were normal.      Scattered medium-mouthed diverticula were found in the sigmoid colon and       descending colon.      Four semi-pedunculated polyps were found in the ascending colon. The       polyps were 3 to 8 mm in  size. These polyps were removed with a cold       snare. Resection and retrieval were complete. Estimated blood loss was       minimal.      The exam was otherwise without abnormality on direct and retroflexion       views. Impression:               - Diverticulosis in the sigmoid colon and in the                            descending colon.                           - Four 3 to 8 mm polyps in the ascending colon,                            removed with a cold snare. Resected and retrieved.                           - The examination was otherwise normal on direct                            and retroflexion views. Moderate Sedation:      Moderate (conscious) sedation was administered by the  endoscopy nurse       and supervised by the endoscopist. The following parameters were       monitored: oxygen saturation, heart rate, blood pressure, respiratory       rate, EKG, adequacy of pulmonary ventilation, and response to care.       Total physician intraservice time was 14 minutes. Recommendation:           - Patient has a contact number available for                            emergencies. The signs and symptoms of potential                            delayed complications were discussed with the                            patient. Return to normal activities tomorrow.                            Written discharge instructions were provided to the                            patient.                           - Resume previous diet.                           - Continue present medications.                           - Repeat colonoscopy date to be determined after  pending pathology results are reviewed for                            surveillance based on pathology results.                           - Return to GI office (date not yet determined). Procedure Code(s):        --- Professional ---                           [phone], Colonoscopy, flexible; with removal of                            tumor(s), polyp(s), or other lesion(s) by snare                            technique                           G0500, Moderate sedation services provided by the                            same physician or other qualified health care                            professional performing a gastrointestinal                            endoscopic service that sedation supports,                            requiring the presence of an independent trained                            observer to assist in the monitoring of the                            patient's level of consciousness and physiological                            status; initial 15 minutes of intra-service time;                             patient age 46 years or older (additional time may                            be reported with [phone], as appropriate) Diagnosis Code(s):        --- Professional ---                           Z86.010, Personal history of colonic polyps                           K63.5, Polyp of colon  K57.30, Diverticulosis of large intestine without                            perforation or abscess without bleeding CPT copyright 2019 American Medical Association. All rights reserved. The codes documented in this report are preliminary and upon coder review may  be revised to meet current compliance requirements. Cristopher Estimable. Rachit Grim, MD Norvel Richards, MD 07/18/2020 10:04:36 AM This report has been signed electronically. Number of Addenda: 0

## 2020-07-19 ENCOUNTER — Encounter: Payer: Self-pay | Admitting: Internal Medicine

## 2020-07-19 LAB — SURGICAL PATHOLOGY

## 2020-07-25 ENCOUNTER — Encounter (HOSPITAL_COMMUNITY): Payer: Self-pay | Admitting: Internal Medicine

## 2021-01-31 DIAGNOSIS — Z79899 Other long term (current) drug therapy: Secondary | ICD-10-CM | POA: Insufficient documentation

## 2021-04-25 DIAGNOSIS — M1990 Unspecified osteoarthritis, unspecified site: Secondary | ICD-10-CM | POA: Insufficient documentation

## 2021-09-25 ENCOUNTER — Emergency Department (HOSPITAL_COMMUNITY): Payer: Medicare Other

## 2021-09-25 ENCOUNTER — Encounter (HOSPITAL_COMMUNITY): Payer: Self-pay | Admitting: Emergency Medicine

## 2021-09-25 ENCOUNTER — Other Ambulatory Visit (HOSPITAL_COMMUNITY): Payer: Self-pay | Admitting: *Deleted

## 2021-09-25 ENCOUNTER — Observation Stay (HOSPITAL_COMMUNITY)
Admission: EM | Admit: 2021-09-25 | Discharge: 2021-10-01 | Disposition: A | Discharge status: Home Health | Payer: Medicare Other | Attending: Internal Medicine | Admitting: Internal Medicine

## 2021-09-25 ENCOUNTER — Observation Stay (HOSPITAL_BASED_OUTPATIENT_CLINIC_OR_DEPARTMENT_OTHER): Payer: Medicare Other

## 2021-09-25 ENCOUNTER — Other Ambulatory Visit: Payer: Self-pay

## 2021-09-25 DIAGNOSIS — R1310 Dysphagia, unspecified: Secondary | ICD-10-CM | POA: Diagnosis not present

## 2021-09-25 DIAGNOSIS — E876 Hypokalemia: Secondary | ICD-10-CM | POA: Insufficient documentation

## 2021-09-25 DIAGNOSIS — R531 Weakness: Secondary | ICD-10-CM | POA: Diagnosis present

## 2021-09-25 DIAGNOSIS — R260 Ataxic gait: Secondary | ICD-10-CM | POA: Insufficient documentation

## 2021-09-25 DIAGNOSIS — K219 Gastro-esophageal reflux disease without esophagitis: Secondary | ICD-10-CM | POA: Diagnosis not present

## 2021-09-25 DIAGNOSIS — I639 Cerebral infarction, unspecified: Secondary | ICD-10-CM | POA: Diagnosis not present

## 2021-09-25 DIAGNOSIS — E785 Hyperlipidemia, unspecified: Secondary | ICD-10-CM | POA: Diagnosis present

## 2021-09-25 DIAGNOSIS — Z87891 Personal history of nicotine dependence: Secondary | ICD-10-CM | POA: Diagnosis not present

## 2021-09-25 DIAGNOSIS — R7303 Prediabetes: Secondary | ICD-10-CM | POA: Insufficient documentation

## 2021-09-25 DIAGNOSIS — I471 Supraventricular tachycardia: Secondary | ICD-10-CM | POA: Insufficient documentation

## 2021-09-25 DIAGNOSIS — M6281 Muscle weakness (generalized): Secondary | ICD-10-CM | POA: Diagnosis not present

## 2021-09-25 DIAGNOSIS — I10 Essential (primary) hypertension: Secondary | ICD-10-CM | POA: Diagnosis not present

## 2021-09-25 DIAGNOSIS — Z79899 Other long term (current) drug therapy: Secondary | ICD-10-CM | POA: Diagnosis not present

## 2021-09-25 DIAGNOSIS — R2689 Other abnormalities of gait and mobility: Secondary | ICD-10-CM | POA: Insufficient documentation

## 2021-09-25 DIAGNOSIS — I6389 Other cerebral infarction: Secondary | ICD-10-CM

## 2021-09-25 LAB — COMPREHENSIVE METABOLIC PANEL
ALT: 17 U/L (ref 0–44)
AST: 30 U/L (ref 15–41)
Albumin: 3.6 g/dL (ref 3.5–5.0)
Alkaline Phosphatase: 74 U/L (ref 38–126)
Anion gap: 9 (ref 5–15)
BUN: 23 mg/dL (ref 8–23)
CO2: 24 mmol/L (ref 22–32)
Calcium: 8.7 mg/dL — ABNORMAL LOW (ref 8.9–10.3)
Chloride: 103 mmol/L (ref 98–111)
Creatinine, Ser: 1.11 mg/dL (ref 0.61–1.24)
GFR, Estimated: >60 mL/min (ref >60)
Glucose, Bld: 114 mg/dL — ABNORMAL HIGH (ref 70–99)
Potassium: 3.2 mmol/L — ABNORMAL LOW (ref 3.5–5.1)
Sodium: 136 mmol/L (ref 135–145)
Total Bilirubin: 0.5 mg/dL (ref 0.3–1.2)
Total Protein: 7.6 g/dL (ref 6.5–8.1)

## 2021-09-25 LAB — CBC WITH DIFFERENTIAL/PLATELET
Abs Immature Granulocytes: 0.05 10*3/uL (ref 0.00–0.07)
Basophils Absolute: 0.1 10*3/uL (ref 0.0–0.1)
Basophils Relative: 1 %
Eosinophils Absolute: 0.2 10*3/uL (ref 0.0–0.5)
Eosinophils Relative: 4 %
HCT: 39.4 % (ref 39.0–52.0)
Hemoglobin: 12.8 g/dL — ABNORMAL LOW (ref 13.0–17.0)
Immature Granulocytes: 1 %
Lymphocytes Relative: 27 %
Lymphs Abs: 1.7 10*3/uL (ref 0.7–4.0)
MCH: 30.4 pg (ref 26.0–34.0)
MCHC: 32.5 g/dL (ref 30.0–36.0)
MCV: 93.6 fL (ref 80.0–100.0)
Monocytes Absolute: 1.1 10*3/uL — ABNORMAL HIGH (ref 0.1–1.0)
Monocytes Relative: 18 %
Neutro Abs: 3 10*3/uL (ref 1.7–7.7)
Neutrophils Relative %: 49 %
Platelets: 208 10*3/uL (ref 150–400)
RBC: 4.21 MIL/uL — ABNORMAL LOW (ref 4.22–5.81)
RDW: 13.6 % (ref 11.5–15.5)
WBC: 6.1 10*3/uL (ref 4.0–10.5)
nRBC: 0 % (ref 0.0–0.2)

## 2021-09-25 LAB — ECHOCARDIOGRAM COMPLETE
AR max vel: 4.33 cm2
AV Area VTI: 4.51 cm2
AV Area mean vel: 4.5 cm2
AV Mean grad: 2 mmHg
AV Peak grad: 3.9 mmHg
Ao pk vel: 0.99 m/s
Area-P 1/2: 3.5 cm2
Calc EF: 68 %
Height: 71 in
MV VTI: 3.29 cm2
S' Lateral: 2.1 cm
Single Plane A2C EF: 70.6 %
Single Plane A4C EF: 66.5 %
Weight: 2464 oz

## 2021-09-25 LAB — HEMOGLOBIN A1C
Hgb A1c MFr Bld: 5.7 % — ABNORMAL HIGH (ref 4.8–5.6)
Mean Plasma Glucose: 116.89 mg/dL

## 2021-09-25 LAB — RAPID URINE DRUG SCREEN, HOSP PERFORMED
Amphetamines: NOT DETECTED
Barbiturates: NOT DETECTED
Benzodiazepines: NOT DETECTED
Cocaine: NOT DETECTED
Opiates: NOT DETECTED
Tetrahydrocannabinol: NOT DETECTED

## 2021-09-25 LAB — LIPASE, BLOOD: Lipase: 31 U/L (ref 11–51)

## 2021-09-25 LAB — ETHANOL: Alcohol, Ethyl (B): <10 mg/dL (ref <10)

## 2021-09-25 MED ORDER — ACETAMINOPHEN 160 MG/5ML PO SOLN: 650.0000 mg | ORAL | Status: DC | PRN

## 2021-09-25 MED ORDER — ACETAMINOPHEN 325 MG PO TABS
650.0000 mg | ORAL_TABLET | ORAL | Status: DC | PRN
  Administered 2021-09-28: 650 mg via ORAL
  Filled 2021-09-25: qty 2

## 2021-09-25 MED ORDER — ASPIRIN 325 MG PO TABS
325.0000 mg | ORAL_TABLET | Freq: Every day | ORAL | Status: DC
  Administered 2021-09-26: 325 mg via ORAL
  Filled 2021-09-25 (×2): qty 1

## 2021-09-25 MED ORDER — ASPIRIN 300 MG RE SUPP
300.0000 mg | Freq: Every day | RECTAL | Status: DC
  Administered 2021-09-25: 300 mg via RECTAL
  Filled 2021-09-25 (×2): qty 1

## 2021-09-25 MED ORDER — SODIUM CHLORIDE 0.9 % IV SOLN: INTRAVENOUS | Status: DC

## 2021-09-25 MED ORDER — CLOPIDOGREL BISULFATE 75 MG PO TABS
75.0000 mg | ORAL_TABLET | Freq: Every day | ORAL | Status: DC
  Administered 2021-09-25 – 2021-10-01 (×7): 75 mg via ORAL
  Filled 2021-09-25 (×7): qty 1

## 2021-09-25 MED ORDER — MORPHINE SULFATE (PF) 4 MG/ML IV SOLN
4.0000 mg | Freq: Once | INTRAVENOUS | Status: AC
  Administered 2021-09-25: 4 mg via INTRAVENOUS
  Filled 2021-09-25: qty 1

## 2021-09-25 MED ORDER — ONDANSETRON HCL 4 MG/2ML IJ SOLN
4.0000 mg | Freq: Once | INTRAMUSCULAR | Status: AC
  Administered 2021-09-25: 4 mg via INTRAVENOUS
  Filled 2021-09-25: qty 2

## 2021-09-25 MED ORDER — SODIUM CHLORIDE 0.9 % IV BOLUS
1000.0000 mL | Freq: Once | INTRAVENOUS | Status: AC
  Administered 2021-09-25: 1000 mL via INTRAVENOUS

## 2021-09-25 MED ORDER — PANTOPRAZOLE SODIUM 40 MG PO TBEC
40.0000 mg | DELAYED_RELEASE_TABLET | Freq: Every day | ORAL | Status: DC
  Administered 2021-09-25 – 2021-10-01 (×7): 40 mg via ORAL
  Filled 2021-09-25 (×7): qty 1

## 2021-09-25 MED ORDER — METOPROLOL SUCCINATE ER 50 MG PO TB24
50.0000 mg | ORAL_TABLET | Freq: Every day | ORAL | Status: DC
  Administered 2021-09-25 – 2021-10-01 (×7): 50 mg via ORAL
  Filled 2021-09-25 (×7): qty 1

## 2021-09-25 MED ORDER — STROKE: EARLY STAGES OF RECOVERY BOOK
Freq: Once | Status: AC
  Filled 2021-09-25: qty 1

## 2021-09-25 MED ORDER — ACETAMINOPHEN 650 MG RE SUPP: 650.0000 mg | RECTAL | Status: DC | PRN

## 2021-09-25 MED ORDER — ENOXAPARIN SODIUM 40 MG/0.4ML IJ SOSY
40.0000 mg | PREFILLED_SYRINGE | INTRAMUSCULAR | Status: DC
  Administered 2021-09-25 – 2021-10-01 (×7): 40 mg via SUBCUTANEOUS
  Filled 2021-09-25 (×6): qty 0.4

## 2021-09-25 MED ORDER — ACETAMINOPHEN 500 MG PO TABS: 1000.0000 mg | ORAL_TABLET | Freq: Once | ORAL | Status: DC

## 2021-09-25 MED ORDER — ROSUVASTATIN CALCIUM 20 MG PO TABS
20.0000 mg | ORAL_TABLET | Freq: Every day | ORAL | Status: DC
  Administered 2021-09-25 – 2021-10-01 (×7): 20 mg via ORAL
  Filled 2021-09-25 (×7): qty 1

## 2021-09-25 NOTE — Assessment & Plan Note (Addendum)
-   Resume home antihypertensive agents -Close monitoring of patient blood pressure with further adjustment to his medications as needed.

## 2021-09-25 NOTE — Assessment & Plan Note (Addendum)
-  Continue the use of Crestor.

## 2021-09-25 NOTE — Assessment & Plan Note (Signed)
-  Affecting cerebellar area -No acute abnormality has been seen on telemetry -Neurology recommended 30-day event monitoring as an outpatient.  -LDL 108, triglycerides 90, total cholesterol 172 and HDL 46.  Patient will be continue on a statin.   -A1c 5.7; lifestyle modifications and carbohydrate diet discussed with patient and he is meeting criteria for prediabetes.   -2D echo and carotid Dopplers stable and not demonstrating acute source for patient stroke. -After discussing with neurology plan will be for 107-monthdual antiplatelet therapy using aspirin and Plavix with subsequent use of daily aspirin for prevention. -Continue risk factor modifications.

## 2021-09-25 NOTE — ED Notes (Signed)
Pt returned from ct

## 2021-09-25 NOTE — ED Notes (Signed)
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 Patient continues to ask the same questions multiple times.

## 2021-09-25 NOTE — Plan of Care (Signed)
  Problem: Acute Rehab PT Goals(only PT should resolve) Goal: Pt Will Go Supine/Side To Sit Outcome: Progressing Flowsheets (Taken 09/25/2021 1548) Pt will go Supine/Side to Sit:  Independently  with modified independence Goal: Patient Will Transfer Sit To/From Stand Outcome: Progressing Flowsheets (Taken 09/25/2021 1548) Patient will transfer sit to/from stand:  with min guard assist  with minimal assist Goal: Pt Will Transfer Bed To Chair/Chair To Bed Outcome: Progressing Flowsheets (Taken 09/25/2021 1548) Pt will Transfer Bed to Chair/Chair to Bed:  min guard assist  with min assist Goal: Pt Will Perform Standing Balance Or Pre-Gait Outcome: Progressing Flowsheets (Taken 09/25/2021 1548) Pt will perform standing balance or pre-gait:  with minimal assist  with min guard assist  with bilateral UE support Goal: Pt Will Ambulate Outcome: Progressing Flowsheets (Taken 09/25/2021 1548) Pt will Ambulate:  75 feet  with minimal assist  with moderate assist  with rolling walker Goal: Pt Will Go Up/Down Stairs Outcome: Progressing Flowsheets (Taken 09/25/2021 1548) Pt will Go Up / Down Stairs:  with moderate assist  with minimal assist  3-5 stairs   3:49 PM, 09/25/21 Lestine Box, S/PT

## 2021-09-25 NOTE — Progress Notes (Incomplete)
*  PRELIMINARY RESULTS* Echocardiogram 2D Echocardiogram has been performed.  Javier [name] 09/25/2021, 1:38 PM

## 2021-09-25 NOTE — ED Provider Notes (Signed)
9:18 AM Care of the patient assumed at signout.  After speaking with our neurologist following MRI consistent with cerebellar infarct resulted, patient is aware of need for admission for further monitoring, management.   Carmin Muskrat, MD 09/25/21 [phone]

## 2021-09-25 NOTE — H&P (Signed)
History and Physical    Patient: Javier [name] KWI:[id number] DOB: 16-Mar-1946 DOA: 09/25/2021 DOS: the patient was seen and examined on 09/25/2021 PCP: Lucia Gaskins, MD (Inactive)  Patient coming from: Home  Chief Complaint:  Chief Complaint  Patient presents with   Weakness   HPI: Javier [name] is a 76 y.o. male with medical history significant of hypertension, hyperlipidemia and gastroesophageal reflux disease who presented to the hospital secondary to dizziness and poor balance.  Patient reports symptom has been present for the last 24-48 hours and that he woke up feeling that way when the symptoms started.  He thought that they would go away but unfortunately, that has not been the case; he has remained dizzy when changing positions and expressed unbalance gait.  Patient denies chest pain, shortness of breath, fever, coughing spells, dysuria, hematuria, diarrhea, melena, hematochezia, blurry vision or any other complaints. Work-up in the ED has demonstrated no acute hemorrhagic changes on CT scan; positive MRI for cerebellar infarcts that would explain patient's symptoms and presentation.  Discussed with neurology service who has recommended admission to the hospital to expedite stroke work-up.  Review of Systems: As mentioned in the history of present illness. All other systems reviewed and are negative. Past Medical History:  Diagnosis Date   Cataract    HLD (hyperlipidemia)    Hypertension    Past Surgical History:  Procedure Laterality Date   COLONOSCOPY  11/19/2009   RMR: 1. Multiple small rectal polyps status post snare polypectomy. 2. Multiple left colon polyp status post snare removal . Poor prep compromised exam.    COLONOSCOPY N/A 12/13/2014   Procedure: COLONOSCOPY;  Surgeon: Daneil Dolin, MD;  [facility];  Service: Endoscopy;  Laterality: N/A;  945   COLONOSCOPY N/A 07/18/2020   Procedure: COLONOSCOPY;  Surgeon: Daneil Dolin, MD;  Location:  [facility];  Service: Endoscopy;  Laterality: N/A;  am   FLEXIBLE SIGMOIDOSCOPY N/A 04/25/2020   Procedure: FLEXIBLE SIGMOIDOSCOPY;  Surgeon: Daneil Dolin, MD; incomplete colonoscopy with flex sig only due to poor prep.   HERNIA REPAIR     POLYPECTOMY  07/18/2020   Procedure: POLYPECTOMY;  Surgeon: Daneil Dolin, MD;  Location: [facility];  Service: Endoscopy;;   Social History:  reports that he has quit smoking. He has never used smokeless tobacco. He reports current alcohol use. He reports that he does not use drugs.  No Known Allergies  Family History  Problem Relation Age of Onset   Colon cancer Neg Hx    Stroke Mother    Heart attack Father     Prior to Admission medications   Medication Sig Start Date End Date Taking? Authorizing Provider  rosuvastatin (CRESTOR) 20 MG tablet Take 20 mg by mouth daily.    Yes [provider]  amLODipine (NORVASC) 5 MG tablet Take 5 mg by mouth daily.     [provider]  cloNIDine (CATAPRES) 0.1 MG tablet Take by mouth daily.  06/24/12   [provider]  ezetimibe (ZETIA) 10 MG tablet Take by mouth daily.     [provider]  metoprolol succinate (TOPROL-XL) 50 MG 24 hr tablet Take 50 mg by mouth daily. Take with or immediately following a meal.     [provider]  naproxen (NAPROSYN) 500 MG tablet Take 500 mg by mouth 2 (two) times daily with a meal.     [provider]  olmesartan (BENICAR) 20 MG tablet Take 20 mg  by mouth daily.     [provider]  polyethylene glycol-electrolytes (TRILYTE) 420 g solution Take 4,000 mLs by mouth as directed. 06/04/20   Rourk, Cristopher Estimable, MD  [place] 420 MG/3.5ML SOCT SMARTSIG:420 Milligram(s) SUB-Q Once a Month 11/09/19   [provider]    Physical Exam: Vitals:   09/25/21 1349 09/25/21 1532 09/25/21 1724 09/25/21 1739  BP: (!) 178/81 137/88 (!) 157/75 (!) 139/56  Pulse: (!) 55 62 60 70  Resp: '18 18 19 18   '$ Temp: 97.8 F (36.6 C) 98.6 F (37 C) 98 F (36.7 C) 98.4 F (36.9 C)  TempSrc: Oral Oral Oral Oral  SpO2: 98% 97% 97% 98%  Weight:      Height:       General exam: Alert, awake, oriented x 3; dry mucous membranes appreciated on exam; no chest pain, no fever, no nausea vomiting. Respiratory system: Clear to auscultation. Respiratory effort normal.  No requiring oxygen supplementation.  Good saturation on room air. Cardiovascular system: Regular rate and rhythm; no rubs or gallops. Gastrointestinal system: Abdomen is nondistended, soft and nontender. No organomegaly or masses felt. Normal bowel sounds heard. Central nervous system: Alert and oriented.  Normal muscle strength appreciated bilaterally symmetrically upper and lower extremities.  No pronation drift.  Slight dysmetria appreciated on his left side.  Patient reported feeling dizzy.  No appreciated nystagmus. Extremities: No cyanosis or clubbing.  No edema on exam. Skin: No rashes, no petechiae. Psychiatry: Judgement and insight appear normal. Mood & affect appropriate.   Data Reviewed: CT head: Demonstrating no acute findings; there are chronic infarcts in the left more than right cerebellar and left occipital cortex.  MRI/MRA of the brain: Demonstrating acute infarcts involving the left cerebellum extending into the dorsal and left midbrain.  There is 2 small foci of acute infarction in the right cerebellum.  Patent vertebrobasilar arteries.  Left S CEA may be occluded.  Chronic bilateral cerebellar and left occipital infarcts appreciated.  There is chronic microvascular ischemic changes. CBC: White blood cell 6.1, hemoglobin 12.8, platelets 208K Comprehensive metabolic panel: Sodium 220, potassium 3.2, chloride 103, BUN 23, creatinine 1.11; normal LFTs. UDS negative.   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 -Secondary prevention using aspirin and Plavix has been initiated -Will follow neurology service recommendations. -Continue the use of Crestor. -Allow permissive hypertension.  Gastroesophageal reflux disease - Continue PPI.  Hyperlipidemia - Lipid panel has been ordered -Continue the use of Crestor.  Essential hypertension - Allowing for permissive hypertension in the setting of acute ischemic stroke. -Hold home antihypertensive agents; except for the use of metoprolol to avoid rebound tachycardia. -Follow vital signs and use as needed medications for systolic blood pressure above 220/diastolic blood pressure above 110. -Heart healthy diet discussed with patient.      Advance Care Planning:   Code Status: Full Code   Consults: Neurology service.  Family Communication: No family at bedside.  Severity of Illness: The appropriate patient status for this patient is OBSERVATION. Observation status is judged to be reasonable and necessary in order to provide the required intensity of service to ensure the patient's safety. The patient's presenting symptoms, physical exam findings, and initial radiographic and laboratory data in the context of their medical condition is felt to place them at decreased risk for further clinical  deterioration. Furthermore, it is anticipated that the patient will be medically stable for discharge from the hospital within 2 midnights of admission.   Author: Barton Dubois, MD 09/25/2021 7:24 PM  For on call review [email or web address].

## 2021-09-25 NOTE — ED Provider Notes (Signed)
[facility] EMERGENCY DEPARTMENT Provider Note   CSN: [id number] Arrival date & time: 09/25/21  7829     History  Chief Complaint  Patient presents with   Weakness    Javier [name] is a 76 y.o. male.  HPI     This is a 76 year old male who presents by EMS with concerns for dizziness.  Patient states that he woke up this morning and felt weak and dizzy.  He has been nauseated.  He denies room spinning dizziness.  Patient reports that he drinks daily.  When asked, she drank last night he cannot quantify.  Patient states that every time he gets up "I just have to fall back down."  EMS noted that he was able to ambulate to the stretcher.  He denies any abdominal pain but has had 1 episode of nonbilious, nonbloody emesis.  No recent illnesses or fevers.  Home Medications Prior to Admission medications   Medication Sig Start Date End Date Taking? Authorizing Provider  amLODipine (NORVASC) 5 MG tablet Take 5 mg by mouth daily.     [provider]  cloNIDine (CATAPRES) 0.1 MG tablet Take by mouth daily.  06/24/12   [provider]  ezetimibe (ZETIA) 10 MG tablet Take by mouth daily.     [provider]  metoprolol succinate (TOPROL-XL) 50 MG 24 hr tablet Take 50 mg by mouth daily. Take with or immediately following a meal.     [provider]  naproxen (NAPROSYN) 500 MG tablet Take 500 mg by mouth 2 (two) times daily with a meal.     [provider]  olmesartan (BENICAR) 20 MG tablet Take 20 mg by mouth daily.     [provider]  polyethylene glycol-electrolytes (TRILYTE) 420 g solution Take 4,000 mLs by mouth as directed. 06/04/20   Rourk, Cristopher Estimable, MD  [place] 420 MG/3.5ML SOCT SMARTSIG:420 Milligram(s) SUB-Q Once a Month 11/09/19   [provider]  rosuvastatin (CRESTOR) 20 MG tablet Take 20 mg by mouth daily.     [provider]      Allergies    Patient has no known allergies.    Review  of Systems   Review of Systems  Constitutional:  Negative for fever.  Gastrointestinal:  Positive for nausea and vomiting. Negative for abdominal pain.  Neurological:  Positive for dizziness.  All other systems reviewed and are negative.  Physical Exam Updated Vital Signs BP (!) 166/92   Pulse 83   Temp 98 F (36.7 C)   Resp (!) 24   Ht 1.803 m ('5\' 11"'$ )   Wt 71 kg   SpO2 97%   BMI 21.83 kg/m  Physical Exam Vitals and nursing note reviewed.  Constitutional:      Appearance: He is well-developed.     Comments: Chronically ill-appearing  HENT:     Head: Normocephalic and atraumatic.     Mouth/Throat:     Mouth: Mucous membranes are dry.  Eyes:     Extraocular Movements: Extraocular movements intact.     Pupils: Pupils are equal, round, and reactive to light.  Cardiovascular:     Rate and Rhythm: Normal rate and regular rhythm.     Heart sounds: Normal heart sounds. No murmur heard. Pulmonary:     Effort: Pulmonary effort is normal. No respiratory distress.     Breath sounds: Normal breath sounds. No wheezing.  Abdominal:     General: Bowel sounds are normal.     Palpations:  Abdomen is soft.     Tenderness: There is no abdominal tenderness. There is no guarding or rebound.  Musculoskeletal:     Cervical back: Neck supple.  Lymphadenopathy:     Cervical: No cervical adenopathy.  Skin:    General: Skin is warm and dry.  Neurological:     Mental Status: He is alert and oriented to person, place, and time.     Comments: Cranial nerves II through XII intact, 5 out of 5 strength in all 4 extremities, slight dysmetria noted on the left, no drift  Psychiatric:        Mood and Affect: Mood normal.    ED Results / Procedures / Treatments   Labs (all labs ordered are listed, but only abnormal results are displayed) Labs Reviewed  CBC WITH DIFFERENTIAL/PLATELET - Abnormal; Notable for the following components:      Result Value   RBC 4.21 (*)    Hemoglobin 12.8 (*)     Monocytes Absolute 1.1 (*)    All other components within normal limits  COMPREHENSIVE METABOLIC PANEL - Abnormal; Notable for the following components:   Potassium 3.2 (*)    Glucose, Bld 114 (*)    Calcium 8.7 (*)    All other components within normal limits  ETHANOL  LIPASE, BLOOD  RAPID URINE DRUG SCREEN, HOSP PERFORMED    EKG None  Radiology CT Head Wo Contrast  Result Date: 09/25/2021 CLINICAL DATA:  Dizziness, persistent/recurrent with cardiac or vascular cause suspected. EXAM: CT HEAD WITHOUT CONTRAST TECHNIQUE: Contiguous axial images were obtained from the base of the skull through the vertex without intravenous contrast. RADIATION DOSE REDUCTION: This exam was performed according to the departmental dose-optimization program which includes automated exposure control, adjustment of the mA and/or kV according to patient size and/or use of iterative reconstruction technique. COMPARISON:  None Available. FINDINGS: Brain: No evidence of acute infarction, hemorrhage, hydrocephalus, extra-axial collection or mass lesion/mass effect. Well-defined/chronic appearing infarcts in the left more than right cerebellum and left occipital cortex. Vascular: No hyperdense vessel or unexpected calcification. Skull: Normal. Negative for fracture or focal lesion. Sinuses/Orbits: No acute finding. IMPRESSION: 1. No acute finding. 2. 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 Electronically Signed   By: Jorje Guild M.D.   On: 09/25/2021 04:34    Procedures Procedures    Medications Ordered in ED Medications  sodium chloride 0.9 % bolus 1,000 mL (0 mLs Intravenous Stopped 09/25/21 0551)  ondansetron (ZOFRAN) injection 4 mg (4 mg Intravenous Given 09/25/21 0405)    ED Course/ Medical Decision Making/ A&P                           Medical Decision Making Amount and/or Complexity of Data Reviewed Labs: ordered. Radiology: ordered.  Risk Prescription  drug management.   This patient presents to the ED for concern of dizziness, vomiting, this involves an extensive number of treatment options, and is a complaint that carries with it a high risk of complications and morbidity.  I considered the following differential and admission for this acute, potentially life threatening condition.  The differential diagnosis includes vertigo, acute intoxication, acute viral illness, stroke  MDM:    This is a 76 year old male who presents with dizziness and vomiting.  He is ill-appearing but nontoxic.  Vital signs notable for blood pressure 163/99.  Reports drinking alcohol last night and drinking daily.  His neurologic exam is overall  reassuring.  He does appear to have some slight dysmetria on the left; however, with the history of drinking, this could be related to acute intoxication.  Labs obtained. No metabolic derangements.  Lipase normal.  EtOH negative.  CT scan is notable for chronic infarcts left greater than right of the cerebellum.  Patient has no known history of CVA.  Will obtain MRI to evaluate for more acute/subacute given findings of dysmetria on exam.  Patient improved after Zofran and fluids. (Labs, imaging, consults)  Labs: I Ordered, and personally interpreted labs.  The pertinent results include: CBC, BMP, EtOH, lipase  Imaging Studies ordered: I ordered imaging studies including CT head I independently visualized and interpreted imaging. I agree with the radiologist interpretation  Additional history obtained from family.  External records from outside source obtained and reviewed including prior evaluations and admission  Cardiac Monitoring: The patient was maintained on a cardiac monitor.  I personally viewed and interpreted the cardiac monitored which showed an underlying rhythm of: Normal sinus rhythm  Reevaluation: After the interventions noted above, I reevaluated the patient and found that they have :improved  Social  Determinants of Health: Daily alcohol user  Disposition: Pending  Co morbidities that complicate the patient evaluation  Past Medical History:  Diagnosis Date   Cataract    HLD (hyperlipidemia)    Hypertension      Medicines Meds ordered this encounter  Medications   sodium chloride 0.9 % bolus 1,000 mL   ondansetron (ZOFRAN) injection 4 mg    I have reviewed the patients home medicines and have made adjustments as needed  Problem List / ED Course: Problem List Items Addressed This Visit   None               Final Clinical Impression(s) / ED Diagnoses Final diagnoses:  None    Rx / DC Orders ED Discharge Orders     None         Merryl Hacker, MD 09/25/21 [phone]

## 2021-09-25 NOTE — Evaluation (Signed)
Clinical/Bedside Swallow Evaluation Patient Details  Name: Javier [name] MRN: [id number] Date of Birth: [birth year]  Today's Date: 09/25/2021 Time: SLP Start Time (ACUTE ONLY): 1325 SLP Stop Time (ACUTE ONLY): 1342 SLP Time Calculation (min) (ACUTE ONLY): 17 min  Past Medical History:  Past Medical History:  Diagnosis Date   Cataract    HLD (hyperlipidemia)    Hypertension    Past Surgical History:  Past Surgical History:  Procedure Laterality Date   COLONOSCOPY  11/19/2009   RMR: 1. Multiple small rectal polyps status post snare polypectomy. 2. Multiple left colon polyp status post snare removal . Poor prep compromised exam.    COLONOSCOPY N/A 12/13/2014   Procedure: COLONOSCOPY;  Surgeon: Corbin Ade, MD;  [facility];  Service: Endoscopy;  Laterality: N/A;  945   COLONOSCOPY N/A 07/18/2020   Procedure: COLONOSCOPY;  Surgeon: Corbin Ade, MD;  [facility];  Service: Endoscopy;  Laterality: N/A;  am   FLEXIBLE SIGMOIDOSCOPY N/A 04/25/2020   Procedure: FLEXIBLE SIGMOIDOSCOPY;  Surgeon: Corbin Ade, MD; incomplete colonoscopy with flex sig only due to poor prep.   HERNIA REPAIR     POLYPECTOMY  07/18/2020   Procedure: POLYPECTOMY;  Surgeon: Corbin Ade, MD;  [facility];  Service: Endoscopy;;   HPI:  This is a 76 year old male who presents by EMS with concerns for dizziness.  Patient states that he woke up this morning and felt weak and dizzy.  He has been nauseated.  He denies room spinning dizziness.  Patient reports that he drinks daily.  When asked, she drank last night he cannot quantify.  Patient states that every time he gets up "I just have to fall back down."  EMS noted that he was able to ambulate to the stretcher.  He denies any abdominal pain but has had 1 episode of nonbilious, nonbloody emesis.  No recent illnesses or fevers. 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  Two small foci of acute infarction in the  right cerebellum. Patent vertebrobasilar arteries. 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 BSE requested.    Assessment / Plan / Recommendation  Clinical Impression  Pt seen at bedside for clinical swallow evaluation. He failed the Yale swallow screen in the ED due to coughing with sequential swallows. Oral motor examination is WNL, no gross asymmetry. Pt is edentulous, however states he has dentures at home, but often eats without them. Pt stated coughing prior to admission and also dizziness. Pt assessed with ice chips, thin water via cup/straw, puree, and regular textures. Pt with occasional cough after sequential straw sips of thin water, no coughing when cued to take small, cup sips. He acknowledges "I need to slow down". Pt with min lingual residuals with solids which clears with liquid wash. Ok to initiate D3/mech soft with thin liquids, no straws, pills whole in puree, and intermittent supervision with po intake. 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 Above to RN.   SLP Visit Diagnosis: Dysphagia, unspecified (R13.10)    Aspiration Risk  Mild aspiration risk    Diet Recommendation Dysphagia 3 (Mech soft);Thin liquid   Liquid Administration via: Cup;No straw Medication Administration: Whole meds with puree Supervision: Patient able to self feed;Intermittent supervision to cue for compensatory strategies Compensations: Slow rate;Small sips/bites Postural Changes: Seated upright at 90 degrees;Remain upright for at least 30 minutes after po intake    Other  Recommendations Oral Care  Recommendations: Oral care BID Other Recommendations: Clarify dietary restrictions    Recommendations for follow up therapy are one component of a multi-disciplinary discharge planning process, led by the attending physician.  Recommendations may be updated based on patient status, additional functional criteria and insurance authorization.  Follow up  Recommendations Skilled nursing-short term rehab (<3 hours/day)      Assistance Recommended at Discharge Intermittent Supervision/Assistance  Functional Status Assessment Patient has had a recent decline in their functional status and demonstrates the ability to make significant improvements in function in a reasonable and predictable amount of time.  Frequency and Duration min 2x/week  1 week       Prognosis Prognosis for Safe Diet Advancement: Good      Swallow Study   General Date of Onset: 09/25/21 HPI: This is a 76 year old male who presents by EMS with concerns for dizziness.  Patient states that he woke up this morning and felt weak and dizzy.  He has been nauseated.  He denies room spinning dizziness.  Patient reports that he drinks daily.  When asked, she drank last night he cannot quantify.  Patient states that every time he gets up "I just have to fall back down."  EMS noted that he was able to ambulate to the stretcher.  He denies any abdominal pain but has had 1 episode of nonbilious, nonbloody emesis.  No recent illnesses or fevers. 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 Two small foci of acute infarction in the  right cerebellum. Patent vertebrobasilar arteries. 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 BSE requested. Type of Study: Bedside Swallow Evaluation Previous Swallow Assessment: N/A Diet Prior to this Study: NPO Temperature Spikes Noted: No Respiratory Status: Room air History of Recent Intubation: No Behavior/Cognition: Alert;Cooperative Oral Cavity Assessment: Within Functional Limits Oral Care Completed by SLP: Yes Oral Cavity - Dentition: Edentulous Vision: Functional for self-feeding Self-Feeding Abilities: Able to feed self Patient Positioning: Upright in bed Baseline Vocal Quality: Normal Volitional Cough: Strong Volitional Swallow: Able to elicit    Oral/Motor/Sensory Function Overall Oral Motor/Sensory  Function: Within functional limits   Ice Chips Ice chips: Within functional limits Presentation: Spoon   Thin Liquid Thin Liquid: Impaired Presentation: Cup;Self Fed;Straw Pharyngeal  Phase Impairments: Cough - Delayed (after straws)    Nectar Thick Nectar Thick Liquid: Not tested   Honey Thick Honey Thick Liquid: Not tested   Puree Puree: Within functional limits Presentation: Self Fed;Spoon   Solid     Solid: Within functional limits Presentation: Self Fed     Thank you,  Havery Moros, CCC-SLP [phone]  Anniyah Mood 09/25/2021,1:41 PM

## 2021-09-25 NOTE — Progress Notes (Signed)
SLP Cancellation Note  Patient Details Name: Javier [name] MRN: [id number] DOB: 12-18-1945   Cancelled treatment:       Reason Eval/Treat Not Completed: Patient at procedure or test/unavailable (SLP will check back as schedule permits, with ECHO)  Thank you,  Genene Churn, [place]  [place] 09/25/2021, 1:14 PM

## 2021-09-25 NOTE — Evaluation (Signed)
Physical Therapy Evaluation Patient Details Name: Javier [name] MRN: [id number] DOB: 1945/12/14 Today's Date: 09/25/2021  History of Present Illness  This is a 76 year old male who presents by EMS with concerns for dizziness.  Patient states that he woke up this morning and felt weak and dizzy.  He has been nauseated.  He denies room spinning dizziness.  Patient reports that he drinks daily.  When asked, she drank last night he cannot quantify.  Patient states that every time he gets up "I just have to fall back down."  EMS noted that he was able to ambulate to the stretcher.  He denies any abdominal pain but has had 1 episode of nonbilious, nonbloody emesis.  No recent illnesses or fevers.   Clinical Impression  Patient is modified independent with supine to sit bed mobility but requires extended time to complete task. Patient is mod assist with sit to stand and bed to chair transfers with RW. Transfers were initially attempted without RW, but patient exhibited poor balance and unsteadiness in which RW was utilized to complete transfers for safety purposes. Patient's strength was assessed with significant strength deficits noted at C4,5 and 7 of Ue's while other myotomes were WNL. Patient LE myotomes WNL. Patient demonstrated inconsistencies with neuro-muscular strength and sensation testing of UE/LE due to decreased cognitive function and lack of verbal understanding during assessment indicating testing for another time. Patient was able to ambulate for 40 feet with RW and mod/max assist provided by PT. Ataxic gait and along with deviated gait pattern overall observed during assessment.  Patient's overall balancing ability is disturbed with reliance on PT and RW for balance and is constantly reaching out for objects to maintain upright posture. Patient will benefit from continued skilled physical therapy in hospital and recommended venue below to increase strength, balance, endurance for safe ADLs and  gait.      Recommendations for follow up therapy are one component of a multi-disciplinary discharge planning process, led by the attending physician.  Recommendations may be updated based on patient status, additional functional criteria and insurance authorization.  Follow Up Recommendations Acute inpatient rehab (3hours/day)    Assistance Recommended at Discharge Set up Supervision/Assistance  Patient can return home with the following  A lot of help with walking and/or transfers;A lot of help with bathing/dressing/bathroom;Assist for transportation;Help with stairs or ramp for entrance;Assistance with cooking/housework    Equipment Recommendations None recommended by PT  Recommendations for Other Services       Functional Status Assessment Patient has had a recent decline in their functional status and demonstrates the ability to make significant improvements in function in a reasonable and predictable amount of time.     Precautions / Restrictions Precautions Precautions: Fall Restrictions Weight Bearing Restrictions: No      Mobility  Bed Mobility Overal bed mobility: Modified Independent             General bed mobility comments: Patient able to perform supine to sit with modified independence with requiring extended time to perform task.    Transfers Overall transfer level: Needs assistance Equipment used: Rolling walker (2 wheels) Transfers: Sit to/from Stand, Bed to chair/wheelchair/BSC Sit to Stand: Mod assist, Max assist   Step pivot transfers: Mod assist       General transfer comment: Patient able to perform sit to stand independently but moderate balance deficits observed with patient not able to perform transfer safely. Transfer was repeated with RW. Patient able to perform bed to chair transfer without  RW, but need to lean on chair due to loss of balance in which RW was used to safely complete transfer. Patient needed mod assist for both transfers.     Ambulation/Gait Ambulation/Gait assistance: Mod assist, Max assist Gait Distance (Feet): 40 Feet Assistive device: Rolling walker (2 wheels) Gait Pattern/deviations: Step-to pattern, Decreased step length - left, Decreased step length - right, Decreased stride length, Decreased weight shift to right, Wide base of support, Ataxic Gait velocity: decreased     General Gait Details: Patient was able to ambulate for 40 feet with mod/max assist with RW used. Patient demonstrates deviated gait pattern of R LE ER impacting step length and stance time on R LE. Patient also demonstrates poor balance impacting weight shifting on R LE and affecting gait pattern when stepping outsdie BOS.  Stairs            Wheelchair Mobility    Modified Rankin (Stroke Patients Only)       Balance Overall balance assessment: Needs assistance Sitting-balance support: Bilateral upper extremity supported, Feet supported Sitting balance-Leahy Scale: Fair Sitting balance - Comments: Patient able to sit EOB but struggles to support upright position with UE's.   Standing balance support: Bilateral upper extremity supported, During functional activity, Reliant on assistive device for balance Standing balance-Leahy Scale: Poor Standing balance comment: Patient struggles with standing in place due to presentation of sx regarding diagnosis. Patient reliant on RW for balance and often reaches out for furniture or objects in room to assist with balance.                             Pertinent Vitals/Pain Pain Assessment Pain Assessment: No/denies pain    Home Living Family/patient expects to be discharged to:: Private residence Living Arrangements: Spouse/significant other Available Help at Discharge: Family;Available 24 hours/day Type of Home: House Home Access: Stairs to enter Entrance Stairs-Rails: None Entrance Stairs-Number of Steps: 2   Home Layout: One level Home Equipment: Conservation officer, nature  (2 wheels)      Prior Function Prior Level of Function : Needs assist       Physical Assist : ADLs (physical)   ADLs (physical): IADLs Mobility Comments: Patient reports ambulating independently at home and in community with RW prn but was not used. Patient was not driving previously. ADLs Comments: Patient reports being able to perform some ADL's at home but needs wife for assistance     Hand Dominance        Extremity/Trunk Assessment   Upper Extremity Assessment Upper Extremity Assessment: Generalized weakness;RUE deficits/detail;LUE deficits/detail RUE Deficits / Details: C4-5,7 myomtomes 2+/5, All other myotomes WNL but generalized weakness observed. Sensation tested but inconclusive results due to cognitive understand and inconsistency. Patient demonstrated decreased fine motor skills with attempts to put socks on RUE Sensation: decreased proprioception LUE Deficits / Details: C4-5,7 myomtomes 2+/5, All other myotomes WNL but generalized weakness observed. Sensation tested but inconclusive results due to cognitive understand and inconsistency. Patient demonstrated decreased fine motor skills with attempts to put socks on LUE Sensation: decreased proprioception LUE Coordination: decreased fine motor    Lower Extremity Assessment Lower Extremity Assessment: Generalized weakness    Cervical / Trunk Assessment Cervical / Trunk Assessment: Normal  Communication   Communication: No difficulties  Cognition Arousal/Alertness: Awake/alert Behavior During Therapy: WFL for tasks assessed/performed Overall Cognitive Status: Within Functional Limits for tasks assessed  General Comments      Exercises     Assessment/Plan    PT Assessment Patient needs continued PT services;All further PT needs can be met in the next venue of care  PT Problem List Decreased strength;Decreased activity tolerance;Decreased  balance;Decreased mobility;Decreased coordination;Decreased cognition       PT Treatment Interventions DME instruction;Gait training;Stair training;Functional mobility training;Therapeutic activities;Therapeutic exercise;Balance training;Neuromuscular re-education    PT Goals (Current goals can be found in the Care Plan section)  Acute Rehab PT Goals Patient Stated Goal: return home PT Goal Formulation: With patient Time For Goal Achievement: 10/10/21 Potential to Achieve Goals: Fair    Frequency Min 3X/week     Co-evaluation               AM-PAC PT "6 Clicks" Mobility  Outcome Measure Help needed turning from your back to your side while in a flat bed without using bedrails?: None Help needed moving from lying on your back to sitting on the side of a flat bed without using bedrails?: A Little Help needed moving to and from a bed to a chair (including a wheelchair)?: A Lot Help needed standing up from a chair using your arms (e.g., wheelchair or bedside chair)?: A Lot Help needed to walk in hospital room?: A Lot Help needed climbing 3-5 steps with a railing? : A Lot 6 Click Score: 15    End of Session Equipment Utilized During Treatment: Gait belt Activity Tolerance: Patient tolerated treatment well;Patient limited by fatigue;No increased pain Patient left: in chair;with call bell/phone within reach Nurse Communication: Mobility status PT Visit Diagnosis: Unsteadiness on feet (R26.81);Other abnormalities of gait and mobility (R26.89);Muscle weakness (generalized) (M62.81);Ataxic gait (R26.0)    Time: 3291-9166 PT Time Calculation (min) (ACUTE ONLY): 34 min   Charges:   PT Evaluation $PT Eval Moderate Complexity: 1 Mod PT Treatments $Therapeutic Activity: 23-37 mins        3:47 PM, 09/25/21 Lestine Box, S/PT

## 2021-09-25 NOTE — Assessment & Plan Note (Signed)
Continue PPI ?

## 2021-09-25 NOTE — ED Notes (Signed)
Gave urinal and emesis bag

## 2021-09-25 NOTE — ED Triage Notes (Signed)
Since pt woke this am, he states he cannot sit up due to falling back pain is nausea at this time. I asked if he felt dizzy and he did not answer.

## 2021-09-26 ENCOUNTER — Observation Stay (HOSPITAL_COMMUNITY): Payer: Medicare Other

## 2021-09-26 DIAGNOSIS — E785 Hyperlipidemia, unspecified: Secondary | ICD-10-CM | POA: Diagnosis not present

## 2021-09-26 DIAGNOSIS — I639 Cerebral infarction, unspecified: Secondary | ICD-10-CM | POA: Diagnosis not present

## 2021-09-26 DIAGNOSIS — I10 Essential (primary) hypertension: Secondary | ICD-10-CM | POA: Diagnosis not present

## 2021-09-26 DIAGNOSIS — K219 Gastro-esophageal reflux disease without esophagitis: Secondary | ICD-10-CM | POA: Diagnosis not present

## 2021-09-26 LAB — LIPID PANEL
Cholesterol: 172 mg/dL (ref 0–200)
HDL: 46 mg/dL (ref >40)
LDL Cholesterol: 108 mg/dL — ABNORMAL HIGH (ref 0–99)
Total CHOL/HDL Ratio: 3.7 RATIO
Triglycerides: 90 mg/dL (ref <150)
VLDL: 18 mg/dL (ref 0–40)

## 2021-09-26 MED ORDER — BENZONATATE 100 MG PO CAPS: 100.0000 mg | ORAL_CAPSULE | Freq: Three times a day (TID) | ORAL | 0 refills | Status: DC | PRN

## 2021-09-26 MED ORDER — CLOPIDOGREL BISULFATE 75 MG PO TABS: 75.0000 mg | ORAL_TABLET | Freq: Every day | ORAL | 2 refills | Status: AC

## 2021-09-26 MED ORDER — ASPIRIN 81 MG PO TBEC: 81.0000 mg | DELAYED_RELEASE_TABLET | Freq: Every day | ORAL | 2 refills | Status: AC

## 2021-09-26 MED ORDER — PANTOPRAZOLE SODIUM 40 MG PO TBEC: 40.0000 mg | DELAYED_RELEASE_TABLET | Freq: Two times a day (BID) | ORAL | 2 refills | Status: AC

## 2021-09-26 MED ORDER — BENZONATATE 100 MG PO CAPS
100.0000 mg | ORAL_CAPSULE | Freq: Three times a day (TID) | ORAL | Status: DC | PRN
  Administered 2021-09-27 – 2021-10-01 (×5): 100 mg via ORAL
  Filled 2021-09-26 (×5): qty 1

## 2021-09-26 NOTE — Progress Notes (Signed)
Neurology consult noted, ED provider spoken with neurologist based off notes. MD [facility] aware and has spoken with Neurologist.

## 2021-09-26 NOTE — Progress Notes (Signed)
Neurology seen patient via tele neuro. Patient had increased irritability, covering face with hands. Noted patient having resistance while adjusting patient. MD Dyann Kief made aware at [place]. Tele called at 1813 patients heart rate in the 140s, went to assess patient noted patient leaning over bed to grab phone in room. MD Dyann Kief made aware . No new orders. Informed patient regarding discharge patient stated he could not go home due to not having a ride home this late. MD Dyann Kief made aware. Patient to d/c tomorrow 09/27/21.

## 2021-09-26 NOTE — Evaluation (Addendum)
Occupational Therapy Evaluation Patient Details Name: Javier [name] MRN: [id number] DOB: 05/31/1945 Today's Date: 09/26/2021   History of Present Illness This is a 76 year old male who presents by EMS with concerns for dizziness.  Patient states that he woke up this morning and felt weak and dizzy.  He has been nauseated.  He denies room spinning dizziness.  Patient reports that he drinks daily.  When asked, she drank last night he cannot quantify.  Patient states that every time he gets up "I just have to fall back down."  EMS noted that he was able to ambulate to the stretcher.  He denies any abdominal pain but has had 1 episode of nonbilious, nonbloody emesis.  No recent illnesses or fevers.   Clinical Impression   Pt agreeable to OT evaluation and PT treatment during session. Pt is reportedly independent for ADL's at baseline with family to assist for IADL's. Pt typically does not use AD for mobility at baseline. This date pt demonstrates weakness and poor coordination primarily in L UE when compared to R. Pt also shows sings of R  Right homonymous hemianopsia or some form of R side visual field deficit. Pt is able to completed seated ADL's with more min G to supervision assist like lower body dressing with extended time and labored movement. In standing pt is unsteady needing min to mod A. Pt demonstrates ataxic movements needing much assist to maintain balance with RW. Pt reportedly has 24/7 assist at home. Pt will benefit from continued OT in the hospital and recommended venue below to increase strength, balance, and endurance for safe ADL's.        Recommendations for follow up therapy are one component of a multi-disciplinary discharge planning process, led by the attending physician.  Recommendations may be updated based on patient status, additional functional criteria and insurance authorization.   Follow Up Recommendations  Acute inpatient rehab (3hours/day)    Assistance  Recommended at Discharge Intermittent Supervision/Assistance  Patient can return home with the following A lot of help with walking and/or transfers;A little help with bathing/dressing/bathroom;Assistance with cooking/housework;Assistance with feeding;Assist for transportation;Help with stairs or ramp for entrance    Functional Status Assessment  Patient has had a recent decline in their functional status and demonstrates the ability to make significant improvements in function in a reasonable and predictable amount of time.  Equipment Recommendations  None recommended by OT    Recommendations for Other Services  (evaluation from neuro visual specialist)     Precautions / Restrictions Precautions Precautions: Fall Restrictions Weight Bearing Restrictions: No      Mobility Bed Mobility Overal bed mobility: Modified Independent             General bed mobility comments: supine to sit without physical assist    Transfers Overall transfer level: Needs assistance Equipment used: Rolling walker (2 wheels) Transfers: Sit to/from Stand, Bed to chair/wheelchair/BSC Sit to Stand: Min assist, Mod assist     Step pivot transfers: Min assist, Mod assist     General transfer comment: Able to transfer to chair from EOB without AD with mostly min A. More difficulty comes with ambulation in hall and room using RW.      Balance Overall balance assessment: Needs assistance Sitting-balance support: Feet supported, Bilateral upper extremity supported Sitting balance-Leahy Scale: Fair Sitting balance - Comments: If unsupported pt leans and has trouble keeping upright position at times.   Standing balance support: Bilateral upper extremity supported, During functional activity, Reliant on assistive  device for balance Standing balance-Leahy Scale: Poor Standing balance comment: using RW                           ADL either performed or assessed with clinical judgement    ADL Overall ADL's : Needs assistance/impaired Eating/Feeding: Set up;Sitting;Minimal assistance Eating/Feeding Details (indicate cue type and reason): Assited to open milk; noted to have spilled much of his coffee on himself. Grooming: Minimal assistance;Sitting;Min guard   Upper Body Bathing: Minimal assistance;Sitting   Lower Body Bathing: Min guard;Minimal assistance;Sitting/lateral leans   Upper Body Dressing : Supervision/safety;Min guard;Sitting   Lower Body Dressing: Supervision/safety;Min guard;Sitting/lateral leans Lower Body Dressing Details (indicate cue type and reason): able to doff and done L sock seated at EOB with labored effort and extended time Toilet Transfer: Minimal assistance;Stand-pivot;Moderate assistance Toilet Transfer Details (indicate cue type and reason): simulated via EOB to chair transfer without AD Toileting- Clothing Manipulation and Hygiene: Min guard;Minimal assistance;Sit to/from stand   Tub/ Shower Transfer: Minimal assistance;Tub bench;Stand-pivot   Functional mobility during ADLs: Moderate assistance;Rolling walker (2 wheels) General ADL Comments: Pt able to ambulate in hall but with moderate assist due to ataxic movements and poor balance overall.     Vision Baseline Vision/History: 0 No visual deficits Ability to See in Adequate Light: adequate  Patient Visual Report: No change from baseline Vision Assessment?: Yes Tracking/Visual Pursuits:  (Decreased smoothness of tracking in R lower quadrant and L upper and lower quadrant.) Visual Fields: Right homonymous hemianopsia;Right visual field deficit (Pt showing sings of R visual field deficit initially but performed well on test to mark out circles on both sides of paper. Further assessment to determine severity.)         Hand Dominance Right   Extremity/Trunk Assessment Upper Extremity Assessment Upper Extremity Assessment: LUE deficits/detail;RUE deficits/detail RUE Deficits /  Details: Ataxic with coordination deficits but 4+/5 grossly. RUE Sensation: WNL RUE Coordination: decreased gross motor;decreased fine motor LUE Deficits / Details: 3-/5 MMT shoulder flexion; 4 to 4+ grossly for elbow flexion, extension, wrist extension, and grip. LUE Sensation: decreased proprioception LUE Coordination: decreased fine motor;decreased gross motor   Lower Extremity Assessment Lower Extremity Assessment: Defer to PT evaluation   Cervical / Trunk Assessment Cervical / Trunk Assessment: Normal   Communication Communication Communication: No difficulties   Cognition Arousal/Alertness: Awake/alert Behavior During Therapy: WFL for tasks assessed/performed Overall Cognitive Status: Within Functional Limits for tasks assessed                                                        Home Living Family/patient expects to be discharged to:: Private residence Living Arrangements: Spouse/significant other Available Help at Discharge: Family;Available 24 hours/day Type of Home: House Home Access: Stairs to enter [employer] of Steps: 2 Entrance Stairs-Rails: None Home Layout: One level     Bathroom Shower/Tub: Teacher, early years/pre: Handicapped height Bathroom Accessibility: Yes   Home Equipment: Conservation officer, nature (2 wheels)   Additional Comments: taken via PT note      Prior Functioning/Environment Prior Level of Function : Needs assist       Physical Assist : ADLs (physical)   ADLs (physical): IADLs Mobility Comments: Patient reports ambulating independently at home and in community with RW prn but was not used.  Patient was not driving previously. (via PT note) ADLs Comments: Pt reports independence with ADL's with family assisting IADL's.        OT Problem List: Decreased strength;Decreased range of motion;Decreased activity tolerance;Impaired balance (sitting and/or standing);Impaired vision/perception;Impaired UE  functional use;Decreased coordination      OT Treatment/Interventions: Self-care/ADL training;Therapeutic exercise;Neuromuscular education;DME and/or AE instruction;Therapeutic activities;Visual/perceptual remediation/compensation;Patient/family education;Balance training    OT Goals(Current goals can be found in the care plan section) Acute Rehab OT Goals Patient Stated Goal: return home OT Goal Formulation: With patient Time For Goal Achievement: 10/10/21 Potential to Achieve Goals: Fair  OT Frequency: Min 2X/week    Co-evaluation PT/OT/SLP Co-Evaluation/Treatment: Yes Reason for Co-Treatment: To address functional/ADL transfers   OT goals addressed during session: ADL's and self-care      AM-PAC OT "6 Clicks" Daily Activity     Outcome Measure Help from another person eating meals?: A Little Help from another person taking care of personal grooming?: A Little Help from another person toileting, which includes using toliet, bedpan, or urinal?: A Little Help from another person bathing (including washing, rinsing, drying)?: A Little Help from another person to put on and taking off regular upper body clothing?: A Little Help from another person to put on and taking off regular lower body clothing?: A Little 6 Click Score: 18   End of Session Equipment Utilized During Treatment: Gait belt;Rolling walker (2 wheels)  Activity Tolerance: Patient tolerated treatment well Patient left: in chair;with call bell/phone within reach  OT Visit Diagnosis: Unsteadiness on feet (R26.81);Other abnormalities of gait and mobility (R26.89);Ataxia, unspecified (R27.0);Muscle weakness (generalized) (M62.81)                Time: 3086-5784 OT Time Calculation (min): 26 min Charges:  OT General Charges $OT Visit: 1 Visit OT Evaluation $OT Eval Moderate Complexity: 1 Mod  Latrina Guttman OT, MOT  Larey Seat 09/26/2021, 9:55 AM

## 2021-09-26 NOTE — Progress Notes (Signed)
Patients blood pressure 170/84, pulse 56. Noted patient having several coughing episodes. Patient stated he coughs all the time. MD Dyann Kief. New orders placed.

## 2021-09-26 NOTE — Progress Notes (Signed)
Physical Therapy Treatment Patient Details Name: Javier [name] MRN: [id number] DOB: 06-Apr-1946 Today's Date: 09/26/2021   History of Present Illness This is a 76 year old male who presents by EMS with concerns for dizziness.  Patient states that he woke up this morning and felt weak and dizzy.  He has been nauseated.  He denies room spinning dizziness.  Patient reports that he drinks daily.  When asked, she drank last night he cannot quantify.  Patient states that every time he gets up "I just have to fall back down."  EMS noted that he was able to ambulate to the stretcher.  He denies any abdominal pain but has had 1 episode of nonbilious, nonbloody emesis.  No recent illnesses or fevers.    PT Comments    Patient agreeable and motivated for therapy.  Patient had to use bed rail for supine to sitting with HOB flat, once seated had occasional leaning backwards, very unsteady on feet requiring Min/mod hand held assist and use of armrest of chair transferring from bed to chair.  Patient required repeated verbal/tactile cueing to step closer to walker with fair carryover during gait training demonstrating severe ataxia, wide base of support and limited mostly due to fatigue requiring sitting rest break before returning to room (followed with wheelchair for safety).  Patient tolerated sitting up in chair after therapy - nursing staff notified.  Patient will benefit from continued skilled physical therapy in hospital and recommended venue below to increase strength, balance, endurance for safe ADLs and gait.    Recommendations for follow up therapy are one component of a multi-disciplinary discharge planning process, led by the attending physician.  Recommendations may be updated based on patient status, additional functional criteria and insurance authorization.  Follow Up Recommendations  Acute inpatient rehab (3hours/day)     Assistance Recommended at Discharge Set up Supervision/Assistance   Patient can return home with the following A lot of help with walking and/or transfers;A lot of help with bathing/dressing/bathroom;Assist for transportation;Help with stairs or ramp for entrance;Assistance with cooking/housework   Equipment Recommendations  None recommended by PT;BSC/3in1;Rolling walker (2 wheels)    Recommendations for Other Services       Precautions / Restrictions Precautions Precautions: Fall Restrictions Weight Bearing Restrictions: No     Mobility  Bed Mobility Overal bed mobility: Modified Independent                  Transfers Overall transfer level: Needs assistance Equipment used: Rolling walker (2 wheels) Transfers: Sit to/from Stand, Bed to chair/wheelchair/BSC Sit to Stand: Min assist, Mod assist   Step pivot transfers: Min assist, Mod assist       General transfer comment: as per OT notes    Ambulation/Gait Ambulation/Gait assistance: Mod assist Gait Distance (Feet): 50 Feet Assistive device: Rolling walker (2 wheels) Gait Pattern/deviations: Step-to pattern, Decreased step length - left, Decreased step length - right, Decreased stride length, Decreased weight shift to right, Wide base of support, Ataxic Gait velocity: decreased     General Gait Details: slow unsteady labored cadence with severe ataxia, frequent verbal/tactile cueing to step closer to RW with fair carryover, had to take sitting rest break before returning to room, limited mostly due to fatigue and poor standing balance   Stairs             Wheelchair Mobility    Modified Rankin (Stroke Patients Only)       Balance Overall balance assessment: Needs assistance Sitting-balance support: Feet supported, Bilateral upper  extremity supported Sitting balance-Leahy Scale: Fair Sitting balance - Comments: seated at EOB Postural control: Posterior lean Standing balance support: Bilateral upper extremity supported, During functional activity, Reliant on  assistive device for balance Standing balance-Leahy Scale: Poor Standing balance comment: using RW                            Cognition Arousal/Alertness: Awake/alert Behavior During Therapy: WFL for tasks assessed/performed Overall Cognitive Status: Within Functional Limits for tasks assessed                                          Exercises General Exercises - Lower Extremity Long Arc Quad: Seated, AROM, Strengthening, Both, 10 reps Hip Flexion/Marching: Seated, AROM, Strengthening, Both, 10 reps Toe Raises: Seated, AROM, Strengthening, Both, 10 reps Heel Raises: Seated, AROM, Strengthening, Both, 10 reps    General Comments        Pertinent Vitals/Pain Pain Assessment Pain Assessment: No/denies pain    Home Living Family/patient expects to be discharged to:: Private residence Living Arrangements: Spouse/significant other Available Help at Discharge: Family;Available 24 hours/day Type of Home: House Home Access: Stairs to enter Entrance Stairs-Rails: None Entrance Stairs-Number of Steps: 2   Home Layout: One level Home Equipment: Conservation officer, nature (2 wheels) Additional Comments: taken via PT note    Prior Function            PT Goals (current goals can now be found in the care plan section) Acute Rehab PT Goals Patient Stated Goal: return home PT Goal Formulation: With patient Time For Goal Achievement: 10/10/21 Potential to Achieve Goals: Good Progress towards PT goals: Progressing toward goals    Frequency    Min 3X/week      PT Plan Current plan remains appropriate    Co-evaluation PT/OT/SLP Co-Evaluation/Treatment: Yes Reason for Co-Treatment: To address functional/ADL transfers;Complexity of the patient's impairments (multi-system involvement) PT goals addressed during session: Mobility/safety with mobility;Balance;Proper use of DME OT goals addressed during session: ADL's and self-care      AM-PAC PT "6 Clicks"  Mobility   Outcome Measure  Help needed turning from your back to your side while in a flat bed without using bedrails?: None Help needed moving from lying on your back to sitting on the side of a flat bed without using bedrails?: A Little Help needed moving to and from a bed to a chair (including a wheelchair)?: A Lot Help needed standing up from a chair using your arms (e.g., wheelchair or bedside chair)?: A Lot Help needed to walk in hospital room?: A Lot Help needed climbing 3-5 steps with a railing? : A Lot 6 Click Score: 15    End of Session Equipment Utilized During Treatment: Gait belt Activity Tolerance: Patient tolerated treatment well;Patient limited by fatigue Patient left: in chair;with call bell/phone within reach;with chair alarm set Nurse Communication: Mobility status PT Visit Diagnosis: Unsteadiness on feet (R26.81);Other abnormalities of gait and mobility (R26.89);Muscle weakness (generalized) (M62.81);Ataxic gait (R26.0)     Time: 0177-9390 PT Time Calculation (min) (ACUTE ONLY): 26 min  Charges:  $Gait Training: 8-22 mins $Therapeutic Exercise: 8-22 mins                     12:05 PM, 09/26/21 Lonell Grandchild, MPT Physical Therapist with [facility] [phone] office [phone] mobile phone

## 2021-09-26 NOTE — Progress Notes (Signed)
Speech Language Pathology Treatment: Dysphagia  Patient Details Name: Javier [name] MRN: [id number] DOB: [birth year] Today's Date: 09/26/2021 Time: 1220-1238 SLP Time Calculation (min) (ACUTE ONLY): 18 min  Assessment / Plan / Recommendation Clinical Impression  Pt seen for ongoing dysphagia intervention following BSE completed yesterday. Pt was finishing his lunch meal. He exhibited some impulsivity with intake (large bites/sips, spilling foods), however suspect this has more to do with ataxia and wanting to get as much as he can, as quickly as he can, due to spilling. Pt noted to cough intermittently, most notably after diced peaches and occasionally after drinking thin liquids. Pt is adamant that he has been coughing for "months" due to a "head cold". RN reports that Pt took pills whole in puree without incident, but did note some coughing after drinking water. Pt presented with NTL today and he still has an occasional cough. He was reminded to take small sips and avoid straws. Would recommend MBSS prior to d/c, unfortunately, schedule will not allow for today. SLP will message MD about completing tomorrow prior to d/c.   HPI HPI: This is a 76 year old male who presents by EMS with concerns for dizziness.  Patient states that he woke up this morning and felt weak and dizzy.  He has been nauseated.  He denies room spinning dizziness.  Patient reports that he drinks daily.  When asked, she drank last night he cannot quantify.  Patient states that every time he gets up "I just have to fall back down."  EMS noted that he was able to ambulate to the stretcher.  He denies any abdominal pain but has had 1 episode of nonbilious, nonbloody emesis.  No recent illnesses or fevers. 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 Two small foci of acute infarction in the  right cerebellum. Patent vertebrobasilar arteries. 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 BSE  requested.      SLP Plan  Continue with current plan of care;MBS (consider MBSS tomorrow)      Recommendations for follow up therapy are one component of a multi-disciplinary discharge planning process, led by the attending physician.  Recommendations may be updated based on patient status, additional functional criteria and insurance authorization.    Recommendations  Diet recommendations: Dysphagia 3 (mechanical soft);Thin liquid Liquids provided via: Cup;No straw Medication Administration: Whole meds with puree Supervision: Patient able to self feed;Intermittent supervision to cue for compensatory strategies Compensations: Slow rate;Small sips/bites Postural Changes and/or Swallow Maneuvers: Out of bed for meals;Seated upright 90 degrees;Upright 30-60 min after meal                Oral Care Recommendations: Oral care BID Follow Up Recommendations: Skilled nursing-short term rehab (<3 hours/day) Assistance recommended at discharge: Intermittent Supervision/Assistance SLP Visit Diagnosis: Dysphagia, unspecified (R13.10) Plan: Continue with current plan of care;MBS (consider MBSS tomorrow)         Thank you,  Genene Churn, [place]   [place]  09/26/2021, 12:40 PM

## 2021-09-26 NOTE — Discharge Summary (Addendum)
Physician Discharge Summary   Patient: Javier [name] MRN: [id number] DOB: March 11, 1946  Admit date:     09/25/2021  Discharge date: 10/01/21  Discharge Physician: Barton Dubois   PCP: Lucia Gaskins, MD (Inactive)   Recommendations at discharge:  Repeat basic metabolic panel to follow ultralights renal function Reassess blood pressure and adjust antihypertensive regimen as needed Follow-up patient ability to safely swallow food and if needed arrange for outpatient modified barium swallowing test.  (Speech therapy and physical therapy for home health services arranged at time of discharge). Please arrange for 30 days event monitoring as an outpatient.  Discharge Diagnoses: Principal Problem:   Stroke (cerebrum) [facility]) Active Problems:   Essential hypertension   Hyperlipidemia   Gastroesophageal reflux disease Prediabetes Hypokalemia  Hospital Course: FLAVIUS REPSHER is a 76 y.o. male with medical history significant of hypertension, hyperlipidemia and gastroesophageal reflux disease who presented to the hospital secondary to dizziness and poor balance.  Patient reports symptom has been present for the last 24-48 hours and that he woke up feeling that way when the symptoms started.  He thought that they would go away but unfortunately, that has not been the case; he has remained dizzy when changing positions and expressed unbalance gait.  Patient denies chest pain, shortness of breath, fever, coughing spells, dysuria, hematuria, diarrhea, melena, hematochezia, blurry vision or any other complaints. Work-up in the ED has demonstrated no acute hemorrhagic changes on CT scan; positive MRI for cerebellar infarcts that would explain patient's symptoms and presentation.  Discussed with neurology service who has recommended admission to the hospital to expedite stroke work-up.  Assessment and Plan: * Stroke (cerebrum) (Echo) -Affecting cerebellar area -No acute abnormality has been seen  on telemetry -Neurology recommended 30-day event monitoring as an outpatient.  -LDL 108, triglycerides 90, total cholesterol 172 and HDL 46.  Patient will be continue on a statin.   -A1c 5.7; lifestyle modifications and carbohydrate diet discussed with patient and he is meeting criteria for prediabetes.   -2D echo and carotid Dopplers stable and not demonstrating acute source for patient stroke. -After discussing with neurology plan will be for 54-monthdual antiplatelet therapy using aspirin and Plavix with subsequent use of daily aspirin for prevention. -Continue risk factor modifications.   Gastroesophageal reflux disease -Continue PPI.  Hyperlipidemia -Continue the use of Crestor.  Essential hypertension - Resume home antihypertensive agents -Close monitoring of patient blood pressure with further adjustment to his medications as needed.  Prediabetes -A1c 5.7 -Modified carbohydrate diet has been recommended -Patient will need lifestyle modification and reassessment of A1c/CBGs in the last 213-monthith decision to initiate hypoglycemia regimen at that time if needed.  Isolated SVT episode: -Resolved with one-time dose of IV metoprolol through his veins. -No chest pain, no shortness of breath, no nausea, no vomiting. -Electrolytes demonstrated mild hypokalemia and was adequately repleted. -Safe for patient to be discharged to CIR for further care and rehabilitation.   Hypokalemia -Potassium 3.2 -Orally repleted -Follow basic metabolic to assess electrolytes stability.   Consultants: Neurology service. Procedures performed: See below for x-ray reports. Disposition: Patient discharged to CIR.   Diet recommendation: Heart healthy modified carbohydrate diet.  DISCHARGE MEDICATION: Allergies as of 09/26/2021   No Known Allergies      Medication List     STOP taking these medications    amLODipine 5 MG tablet Commonly known as: NORVASC   naproxen 500 MG  tablet Commonly known as: NAPROSYN   polyethylene glycol-electrolytes 420 g solution Commonly known  as: TriLyte       TAKE these medications    aspirin EC 81 MG tablet Take 1 tablet (81 mg total) by mouth daily. Swallow whole.   benzonatate 100 MG capsule Commonly known as: TESSALON Take 1 capsule (100 mg total) by mouth every 8 (eight) hours as needed for cough.   cloNIDine 0.1 MG tablet Commonly known as: CATAPRES Take by mouth daily.   clopidogrel 75 MG tablet Commonly known as: Plavix Take 1 tablet (75 mg total) by mouth daily.   ezetimibe 10 MG tablet Commonly known as: ZETIA Take by mouth daily.   metoprolol succinate 50 MG 24 hr tablet Commonly known as: TOPROL-XL Take 50 mg by mouth daily. Take with or immediately following a meal.   olmesartan 20 MG tablet Commonly known as: BENICAR Take 20 mg by mouth daily.   pantoprazole 40 MG tablet Commonly known as: PROTONIX Take 1 tablet (40 mg total) by mouth 2 (two) times daily.   Repatha Pushtronex System 420 MG/3.5ML Soct Generic drug: Evolocumab with Infusor SMARTSIG:420 Milligram(s) SUB-Q Once a Month   rosuvastatin 20 MG tablet Commonly known as: CRESTOR Take 20 mg by mouth daily.               Durable Medical Equipment  (From admission, onward)           Start     Ordered   09/26/21 1324  For home use only DME 3 n 1  Once        09/26/21 1324   09/26/21 1207  For home use only DME Walker rolling  Once       Question Answer Comment  Walker: With 5 Inch Wheels   Patient needs a walker to treat with the following condition Gait disturbance, post-stroke      09/26/21 1208            Follow-up Information     [facility] Follow up.   Specialty: Home Health Services Why: PT will call to schedule your first home visit. Contact information: [address] [place] [address] [phone]         [facility] Follow up.    Why: will delivery DME equipment. Contact information: [address] [place] [address] [phone]         Lucia Gaskins, MD. Schedule an appointment as soon as possible for a visit in 10 day(s).   Specialty: Internal Medicine Contact information: [place] [address] [phone]         Phillips Odor, MD. Schedule an appointment as soon as possible for a visit in 12 week(s).   Specialty: Neurology Contact information: [place] [address] [phone]                Discharge Exam: Filed Weights   09/25/21 0338 09/25/21 1038  Weight: 71 kg 69.9 kg   General exam: Alert, awake, oriented x 3; demonstrating ataxia and left-sided dysmetria. Respiratory system: Clear to auscultation. Respiratory effort normal. Cardiovascular system:RRR. No murmurs, rubs, gallops.  No JVD. Gastrointestinal system: Abdomen is nondistended, soft and nontender. No organomegaly or masses felt. Normal bowel sounds heard. Central nervous system: Alert and oriented.  No dysarthria.  Following commands appropriately and demonstrating good muscle strength bilaterally.  Left-sided dysmetria and ataxia appreciated. Extremities: No C/C/E, +pedal pulses Skin: No rashes, lesions or ulcers Psychiatry: Judgement and insight appear normal. Mood & affect appropriate.  Condition at discharge: Stable and improved.  The results of significant diagnostics from this hospitalization (including imaging, microbiology, ancillary and laboratory) are listed below for reference.   Imaging Studies: CT Head Wo Contrast  Result Date: 09/25/2021 CLINICAL DATA:  Dizziness, persistent/recurrent with cardiac or vascular cause suspected. EXAM: CT HEAD WITHOUT CONTRAST TECHNIQUE: Contiguous axial images were obtained from the base of the skull through the vertex without intravenous contrast. RADIATION DOSE REDUCTION: This exam was performed according to the departmental  dose-optimization program which includes automated exposure control, adjustment of the mA and/or kV according to patient size and/or use of iterative reconstruction technique. COMPARISON:  None Available. FINDINGS: Brain: No evidence of acute infarction, hemorrhage, hydrocephalus, extra-axial collection or mass lesion/mass effect. Well-defined/chronic appearing infarcts in the left more than right cerebellum and left occipital cortex. Vascular: No hyperdense vessel or unexpected calcification. Skull: Normal. Negative for fracture or focal lesion. Sinuses/Orbits: No acute finding. IMPRESSION: 1. No acute finding. 2. 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 Electronically Signed   By: Jorje Guild M.D.   On: 09/25/2021 04:34   MR ANGIO HEAD WO CONTRAST  Result Date: 09/25/2021 CLINICAL DATA:  Dizziness, persistent/recurrent, cardiac or vascular cause suspected EXAM: MRI HEAD WITHOUT CONTRAST MRA HEAD WITHOUT CONTRAST TECHNIQUE: Multiplanar, multi-echo pulse sequences of the brain and surrounding structures were acquired without intravenous contrast. Angiographic images of the Circle of Willis were acquired using MRA technique without intravenous contrast. COMPARISON:  CT from earlier same day FINDINGS: MRI HEAD Patient could not tolerate all sequences. Brain: There is mildly reduced diffusion in the superior left cerebellum involving the hemisphere and vermis. There is extension into the dorsal left midbrain with involvement of the superior cerebellar peduncle. Additional small area in the inferior left cerebellum. Two small foci of reduced diffusion in the right cerebellum. No evidence of hemorrhage. Additional chronic bilateral cerebellar and left occipital infarcts. Patchy foci of T2 hyperintensity in the supratentorial white matter are nonspecific but probably reflect chronic microvascular ischemic changes. Prominence of the ventricles and sulci reflects parenchymal  volume loss. There is no intracranial mass or mass effect. No extra-axial collection. No hydrocephalus. Vascular: Major vessel flow voids at the skull base are preserved. Skull and upper cervical spine: Normal marrow signal is preserved. Sinuses/Orbits: Mild patchy mucosal thickening. Bilateral lens replacements. Other: Sella is unremarkable.  Mastoid air cells are clear. MRA HEAD Motion artifact is present. Intracranial internal carotid arteries are patent with atherosclerotic irregularity. Middle and anterior cerebral arteries are patent. Intracranial vertebral arteries are patent with atherosclerotic irregularity. Patent PICA origins, more irregular on the left. Likely patent AICA origins. Right SCA origin is patent. The left SCA origin is not identified. Bilateral posterior communicating arteries are present. Posterior cerebral arteries are patent. Bilateral posterior communicating arteries are present. IMPRESSION: Acute infarcts involving the left cerebellum extending into the dorsal left midbrain. Two small foci of acute infarction in the right cerebellum. Patent vertebrobasilar arteries. Left SCA may be occluded. 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 microvascular ischemic changes. Electronically Signed   By: Macy Mis M.D.   On: 09/25/2021 08:03   MR BRAIN WO CONTRAST  Result Date: 09/25/2021 CLINICAL DATA:  Dizziness, persistent/recurrent, cardiac or vascular cause suspected EXAM: MRI HEAD WITHOUT CONTRAST MRA HEAD WITHOUT CONTRAST TECHNIQUE: Multiplanar, multi-echo pulse sequences of the brain and surrounding structures were acquired without intravenous contrast. Angiographic images of the Circle of Willis were acquired using MRA technique without intravenous contrast. COMPARISON:  CT from earlier same day FINDINGS: MRI HEAD Patient could not tolerate all sequences. Brain: There is mildly reduced diffusion in the superior left cerebellum involving the hemisphere and  vermis. There is extension into the dorsal left midbrain with involvement of the superior cerebellar peduncle. Additional small area in the inferior left cerebellum. Two small foci of reduced diffusion in the right cerebellum. No evidence of hemorrhage. Additional chronic bilateral cerebellar and left occipital infarcts. Patchy foci of T2 hyperintensity in the supratentorial white matter are nonspecific but probably reflect chronic microvascular ischemic changes. Prominence of the ventricles and sulci reflects parenchymal volume loss. There is no intracranial mass or mass effect. No extra-axial collection. No hydrocephalus. Vascular: Major vessel flow voids at the skull base are preserved. Skull and upper cervical spine: Normal marrow signal is preserved. Sinuses/Orbits: Mild patchy mucosal thickening. Bilateral lens replacements. Other: Sella is unremarkable.  Mastoid air cells are clear. MRA HEAD Motion artifact is present. Intracranial internal carotid arteries are patent with atherosclerotic irregularity. Middle and anterior cerebral arteries are patent. Intracranial vertebral arteries are patent with atherosclerotic irregularity. Patent PICA origins, more irregular on the left. Likely patent AICA origins. Right SCA origin is patent. The left SCA origin is not identified. Bilateral posterior communicating arteries are present. Posterior cerebral arteries are patent. Bilateral posterior communicating arteries are present. IMPRESSION: Acute infarcts involving the left cerebellum extending into the dorsal left midbrain. Two small foci of acute infarction in the right cerebellum. Patent vertebrobasilar arteries. Left SCA may be occluded. 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 microvascular ischemic changes. Electronically Signed   By: Macy Mis M.D.   On: 09/25/2021 08:03   US Carotid Bilateral  Result Date: 09/26/2021 CLINICAL DATA:  Stroke. History of hypertension and  hyperlipidemia. Former smoker. EXAM: BILATERAL CAROTID DUPLEX ULTRASOUND TECHNIQUE: Pearline Cables scale imaging, color Doppler and duplex ultrasound were performed of bilateral carotid and vertebral arteries in the neck. COMPARISON:  Brain MRI-09/25/2021 FINDINGS: Criteria: Quantification of carotid stenosis is based on velocity parameters that correlate the residual internal carotid diameter with NASCET-based stenosis levels, using the diameter of the distal internal carotid lumen as the denominator for stenosis measurement. The following velocity measurements were obtained: RIGHT ICA: 69/19 cm/sec CCA: 737/1 cm/sec SYSTOLIC ICA/CCA RATIO:  0.6 ECA: 67 cm/sec LEFT ICA: 95/24 cm/sec CCA: 06/2 cm/sec SYSTOLIC ICA/CCA RATIO:  1.5 ECA: 64 cm/sec RIGHT CAROTID ARTERY: There is a minimal amount of eccentric echogenic plaque scattered throughout the right common carotid artery. There is a minimal amount of eccentric echogenic plaque within the right carotid bulb, extending to involve the origin and proximal aspects of the right internal carotid artery (image 24), not resulting in elevated peak systolic velocities within the interrogated course of the right internal carotid artery to suggest a hemodynamically significant stenosis. RIGHT VERTEBRAL ARTERY:  Not visualized LEFT CAROTID ARTERY: There is a minimal amount of atherosclerotic plaque scattered throughout the left common carotid artery. There is a minimal amount of eccentric echogenic plaque involving the origin and proximal aspects of the left internal carotid artery (image 58), not resulting in elevated peak systolic velocities within the interrogated course of the left internal carotid artery to suggest a hemodynamically significant stenosis. LEFT VERTEBRAL ARTERY:  Antegrade Flow IMPRESSION: 1. Minimal amount of bilateral atherosclerotic plaque, right subjectively greater than left, not resulting in a hemodynamically significant stenosis within either internal carotid  artery. 2. Nonvisualization of the right vertebral artery, an age-indeterminate finding in the absence of prior examinations. 3. Patent left  vertebral artery. Electronically Signed   By: Sandi Mariscal M.D.   On: 09/26/2021 08:55   ECHOCARDIOGRAM COMPLETE  Result Date: 09/25/2021    ECHOCARDIOGRAM REPORT   Patient Name:   KAMAREON SCIANDRA Date of Exam: 09/25/2021 Medical Rec #:  [id number]           Height:       71.0 in Accession #:    [id number]          Weight:       154.0 lb Date of Birth:  03/22/1946            BSA:          1.887 m Patient Age:    50 years            BP:           152/76 mmHg Patient Gender: M                   HR:           63 bpm. Exam Location:  [facility] Procedure: 2D Echo, Cardiac Doppler and Color Doppler Indications:   Stroke  History:       Patient has no prior history of Echocardiogram examinations.                Stroke.  Sonographer:   Wenda Low Referring      Clements Phys:  Sonographer Comments: Image acquisition challenging due to respiratory motion. IMPRESSIONS  1. Left ventricular ejection fraction, by estimation, is 65 to 70%. The left ventricle has normal function. The left ventricle has no regional wall motion abnormalities. There is mild asymmetric left ventricular hypertrophy of the basal segment. Left ventricular diastolic parameters were normal.  2. Right ventricular systolic function is normal. The right ventricular size is normal. There is normal pulmonary artery systolic pressure. The estimated right ventricular systolic pressure is 93.5 mmHg.  3. Left atrial size was mildly dilated.  4. The mitral valve is grossly normal. Trivial mitral valve regurgitation.  5. The aortic valve is tricuspid. Aortic valve regurgitation is not visualized. Aortic valve mean gradient measures 2.0 mmHg.  6. Aortic dilatation noted. There is mild dilatation of the aortic root, measuring 40 mm.  7. The inferior vena cava is normal in size with greater than 50% respiratory  variability, suggesting right atrial pressure of 3 mmHg. Comparison(s): No prior Echocardiogram. FINDINGS  Left Ventricle: Left ventricular ejection fraction, by estimation, is 65 to 70%. The left ventricle has normal function. The left ventricle has no regional wall motion abnormalities. The left ventricular internal cavity size was normal in size. There is  mild asymmetric left ventricular hypertrophy of the basal segment. Left ventricular diastolic parameters were normal. Right Ventricle: The right ventricular size is normal. No increase in right ventricular wall thickness. Right ventricular systolic function is normal. There is normal pulmonary artery systolic pressure. The tricuspid regurgitant velocity is 2.35 m/s, and  with an assumed right atrial pressure of 3 mmHg, the estimated right ventricular systolic pressure is 70.1 mmHg. Left Atrium: Left atrial size was mildly dilated. Right Atrium: Right atrial size was normal in size. Pericardium: There is no evidence of pericardial effusion. Mitral Valve: The mitral valve is grossly normal. Trivial mitral valve regurgitation. MV peak gradient, 3.2 mmHg. The mean mitral valve gradient is 1.0 mmHg. Tricuspid Valve: The tricuspid valve is grossly normal. Tricuspid valve regurgitation is mild. Aortic Valve: The aortic valve is tricuspid. There  is mild aortic valve annular calcification. Aortic valve regurgitation is not visualized. Aortic valve mean gradient measures 2.0 mmHg. Aortic valve peak gradient measures 3.9 mmHg. Aortic valve area, by  VTI measures 4.51 cm. Pulmonic Valve: The pulmonic valve was grossly normal. Pulmonic valve regurgitation is trivial. Aorta: Aortic dilatation noted. There is mild dilatation of the aortic root, measuring 40 mm. Venous: The inferior vena cava is normal in size with greater than 50% respiratory variability, suggesting right atrial pressure of 3 mmHg. IAS/Shunts: No atrial level shunt detected by color flow Doppler.  LEFT  VENTRICLE PLAX 2D LVIDd:         4.30 cm      Diastology LVIDs:         2.10 cm      LV e' medial:    9.14 cm/s LV PW:         1.10 cm      LV E/e' medial:  8.2 LV IVS:        1.20 cm      LV e' lateral:   9.36 cm/s LVOT diam:     2.50 cm      LV E/e' lateral: 8.0 LV SV:         112 LV SV Index:   60 LVOT Area:     4.91 cm  LV Volumes (MOD) LV vol d, MOD A2C: 101.0 ml LV vol d, MOD A4C: 82.8 ml LV vol s, MOD A2C: 29.7 ml LV vol s, MOD A4C: 27.7 ml LV SV MOD A2C:     71.3 ml LV SV MOD A4C:     82.8 ml LV SV MOD BP:      63.0 ml RIGHT VENTRICLE RV Basal diam:  3.75 cm RV Mid diam:    3.10 cm RV S prime:     10.30 cm/s TAPSE (M-mode): 2.8 cm LEFT ATRIUM             Index        RIGHT ATRIUM           Index LA diam:        3.60 cm 1.91 cm/m   RA Area:     19.00 cm LA Vol (A2C):   77.6 ml 41.13 ml/m  RA Volume:   57.20 ml  30.31 ml/m LA Vol (A4C):   65.0 ml 34.45 ml/m LA Biplane Vol: 72.7 ml 38.53 ml/m  AORTIC VALVE                    PULMONIC VALVE AV Area (Vmax):    4.33 cm     PV Vmax:       0.66 m/s AV Area (Vmean):   4.50 cm     PV Peak grad:  1.8 mmHg AV Area (VTI):     4.51 cm AV Vmax:           99.20 cm/s AV Vmean:          65.400 cm/s AV VTI:            0.249 m AV Peak Grad:      3.9 mmHg AV Mean Grad:      2.0 mmHg LVOT Vmax:         87.50 cm/s LVOT Vmean:        60.000 cm/s LVOT VTI:          0.229 m LVOT/AV VTI ratio: 0.92  AORTA Ao Root diam: 4.00 cm Ao Asc diam:  3.60  cm MITRAL VALVE               TRICUSPID VALVE MV Area (PHT): 3.50 cm    TR Peak grad:   22.1 mmHg MV Area VTI:   3.29 cm    TR Vmax:        235.00 cm/s MV Peak grad:  3.2 mmHg MV Mean grad:  1.0 mmHg    SHUNTS MV Vmax:       0.89 m/s    Systemic VTI:  0.23 m MV Vmean:      41.8 cm/s   Systemic Diam: 2.50 cm MV Decel Time: 217 msec MV E velocity: 75.00 cm/s MV A velocity: 66.80 cm/s MV E/A ratio:  1.12 Rozann Lesches MD Electronically signed by Rozann Lesches MD Signature Date/Time: 09/25/2021/2:30:39 PM    Final      Microbiology: Results for orders placed or performed during the hospital encounter of 07/16/20  SARS CORONAVIRUS 2 (TAT 6-24 HRS) Nasopharyngeal Nasopharyngeal Swab     Status: None   Collection Time: 07/16/20  9:02 AM   Specimen: Nasopharyngeal Swab  Result Value Ref Range Status   SARS Coronavirus 2 NEGATIVE NEGATIVE Final    Comment: (NOTE) SARS-CoV-2 target nucleic acids are NOT DETECTED.  The SARS-CoV-2 RNA is generally detectable in upper and lower respiratory specimens during the acute phase of infection. Negative results do not preclude SARS-CoV-2 infection, do not rule out co-infections with other pathogens, and should not be used as the sole basis for treatment or other patient management decisions. Negative results must be combined with clinical observations, patient history, and epidemiological information. The expected result is Negative.  Fact Sheet for Patients: SugarRoll.be  Fact Sheet for Healthcare Providers: https://www.woods-mathews.com/  This test is not yet approved or cleared by the [place] FDA and  has been authorized for detection and/or diagnosis of SARS-CoV-2 by FDA under an Emergency Use Authorization (EUA). This EUA will remain  in effect (meaning this test can be used) for the duration of the COVID-19 declaration under Se ction 564(b)(1) of the Act, 21 U.S.C. section 360bbb-3(b)(1), unless the authorization is terminated or revoked sooner.  Performed at [facility], [place] [address] [place], [place] [address]     Labs: CBC: Recent Labs  Lab 09/25/21 0400  WBC 6.1  NEUTROABS 3.0  HGB 12.8*  HCT 39.4  MCV 93.6  PLT 309   Basic Metabolic Panel: Recent Labs  Lab 09/25/21 0400  NA 136  K 3.2*  CL 103  CO2 24  GLUCOSE 114*  BUN 23  CREATININE 1.11  CALCIUM 8.7*   Liver Function Tests: Recent Labs  Lab 09/25/21 0400  AST 30  ALT 17  ALKPHOS 74  BILITOT 0.5  PROT 7.6   ALBUMIN 3.6   Discharge time spent: less than 30 minutes.  Signed: Barton Dubois, MD [facility] 09/26/2021

## 2021-09-26 NOTE — Progress Notes (Signed)
[facility] IP rehab admissions - Noted patient wants to go home with [facility] therapies.  If patient changes his mind, please let us know.  We wound need authorization from [place] prior to admit to CIR.  Call for questions.  [phone]

## 2021-09-26 NOTE — Plan of Care (Signed)
  Problem: Acute Rehab OT Goals (only OT should resolve) Goal: Pt. Will Perform Eating Flowsheets (Taken 09/26/2021 0959) Pt Will Perform Eating:  with modified independence  with adaptive utensils  sitting Goal: Pt. Will Perform Grooming Flowsheets (Taken 09/26/2021 0959) Pt Will Perform Grooming:  with modified independence  sitting Goal: Pt. Will Perform Lower Body Bathing Flowsheets (Taken 09/26/2021 0959) Pt Will Perform Lower Body Bathing:  with modified independence  sitting/lateral leans  with adaptive equipment Goal: Pt. Will Perform Upper Body Dressing Flowsheets (Taken 09/26/2021 0959) Pt Will Perform Upper Body Dressing:  with modified independence  sitting Goal: Pt. Will Perform Lower Body Dressing Flowsheets (Taken 09/26/2021 0959) Pt Will Perform Lower Body Dressing:  with modified independence  sitting/lateral leans Goal: Pt. Will Transfer To Toilet Flowsheets (Taken 09/26/2021 [phone]) Pt Will Transfer to Toilet:  with supervision  squat pivot transfer Goal: Pt. Will Perform Toileting-Clothing Manipulation Flowsheets (Taken 09/26/2021 0959) Pt Will Perform Toileting - Clothing Manipulation and hygiene:  with modified independence  sitting/lateral leans Goal: Pt/Caregiver Will Perform Home Exercise Program Flowsheets (Taken 09/26/2021 [phone]) Pt/caregiver will Perform Home Exercise Program:  Increased ROM  Increased strength  Left upper extremity  Independently  Darald Uzzle OT, MOT

## 2021-09-26 NOTE — TOC Transition Note (Signed)
Transition of Care [facility]) - CM/SW Discharge Note   Patient Details  Name: Javier [name] MRN: [id number] Date of Birth: 1946/01/10  Transition of Care [facility]) CM/SW Contact:  Boneta Lucks, RN Phone Number: 09/26/2021, 12:53 PM   Clinical Narrative:   Patient admitted with Stroke and worked up. PT/OT is recommending CIR. Patient wants to go home with HHPT and equipment.  Caryl Pina accepted the referral for a rolling walker and Bed side commode. Georgina Snell with [facility] accepted for HHPT. MD aware to order.   Final next level of care: [place] Barriers to Discharge: Barriers Resolved   Patient Goals and CMS Choice Patient states their goals for this hospitalization and ongoing recovery are:: to go home. CMS Medicare.gov Compare Post Acute Care list provided to:: Patient Choice offered to / list presented to : Patient  Discharge Placement             Patient and family notified of of transfer: 09/26/21  Discharge Plan and Services               DME Arranged: Gilford Rile rolling, 3-N-1 DME Agency: [employer] Date DME Agency Contacted: 09/26/21 Time DME Agency Contacted: [phone] Representative spoke with at DME Agency: Caryl Pina HH Arranged: PT [place]: [place] Date [place]: 09/26/21 Time [place]: 1253 Representative spoke with at [place]: Georgina Snell

## 2021-09-26 NOTE — Care Management Obs Status (Signed)
[facility] NOTIFICATION   Patient Details  Name: Javier [name] MRN: [id number] Date of Birth: Feb 21, 1946   Medicare Observation Status Notification Given:  Yes (letter signed, copy, given, copy to be scanned to chart)    Tommy Medal 09/26/2021, 2:13 PM

## 2021-09-27 ENCOUNTER — Observation Stay (HOSPITAL_COMMUNITY): Payer: Medicare Other

## 2021-09-27 DIAGNOSIS — I639 Cerebral infarction, unspecified: Secondary | ICD-10-CM | POA: Diagnosis not present

## 2021-09-27 MED ORDER — ASPIRIN 81 MG PO CHEW
81.0000 mg | CHEWABLE_TABLET | Freq: Every day | ORAL | Status: DC
  Administered 2021-09-27 – 2021-10-01 (×5): 81 mg via ORAL
  Filled 2021-09-27 (×5): qty 1

## 2021-09-27 NOTE — Progress Notes (Signed)
Inpatient Rehab Admissions Coordinator:      I spoke with Pt.'s wife regarding potential CIR admit. She states she is open to having pt. Come to CIR, but Pt. Continues to refuse stating he wants to do rehab at home. She states she will discuss with him and call me back later today.   Clemens Catholic, [place], Advance Admissions Coordinator  [phone] ([phone] (office)]

## 2021-09-27 NOTE — TOC Progression Note (Signed)
Transition of Care [facility]) - Progression Note    Patient Details  Name: Javier [name] MRN: [id number] Date of Birth: Jun 19, 1945  Transition of Care [facility]) CM/SW Contact  Boneta Lucks, RN Phone Number: 09/27/2021, 12:58 PM  Clinical Narrative:   Family and patient now asking for CIR. TOC sent CIR a message to review. They are checking Insurance. TOC to follow.     Expected Discharge Plan: IP Rehab Facility Barriers to Discharge: Family Issues  Expected Discharge Plan and Services Expected Discharge Plan: [place]    Expected Discharge Date: 09/26/21               DME Arranged: Gilford Rile rolling, 3-N-1 DME Agency: [employer] Date DME Agency Contacted: 09/26/21 Time DME Agency Contacted: [phone] Representative spoke with at DME Agency: [place]: PT [place]: [place] Date [place]: 09/26/21 Time [place]: 1253 Representative spoke with at [place]: Georgina Snell

## 2021-09-27 NOTE — Progress Notes (Signed)
After completing patient work-up and been seen by neurology service was too late and having no active transportation in order to go home.  Throughout the night patient had think about his condition ongoing weakness and ataxia and is now wanting to pursued inpatient rehabilitation after initially recommended.  He is overall medically stable tolerating dysphagia 3 diet with thin liquids as recommended and taking his medications orally.  CIR has been made aware and at this moment waiting to hear from insurance authorization from [facility] prior to be accepted.  Will continue to provide treatment and care while waiting final discharge pathway.  Please refer to discharge summary written on 09/26/2021 for further info/details on discharge instructions and recommendations.  Barton Dubois MD [phone]

## 2021-09-27 NOTE — Evaluation (Signed)
Speech Language Pathology Evaluation Patient Details Name: Javier [name] MRN: [id number] DOB: [birth year] Today's Date: 09/27/2021 Time: 0973-5329 SLP Time Calculation (min) (ACUTE ONLY): 25 min  Problem List:  Patient Active Problem List   Diagnosis Date Noted   Stroke (cerebrum) ([place]) 09/25/2021   Essential hypertension 09/25/2021   Hyperlipidemia 09/25/2021   Gastroesophageal reflux disease 09/25/2021   Diverticulosis of colon without hemorrhage    History of adenomatous polyp of colon 11/16/2014   Past Medical History:  Past Medical History:  Diagnosis Date   Cataract    HLD (hyperlipidemia)    Hypertension    Past Surgical History:  Past Surgical History:  Procedure Laterality Date   COLONOSCOPY  11/19/2009   RMR: 1. Multiple small rectal polyps status post snare polypectomy. 2. Multiple left colon polyp status post snare removal . Poor prep compromised exam.    COLONOSCOPY N/A 12/13/2014   Procedure: COLONOSCOPY;  Surgeon: Daneil Dolin, MD;  [facility];  Service: Endoscopy;  Laterality: N/A;  945   COLONOSCOPY N/A 07/18/2020   Procedure: COLONOSCOPY;  Surgeon: Daneil Dolin, MD;  Location: [facility];  Service: Endoscopy;  Laterality: N/A;  am   FLEXIBLE SIGMOIDOSCOPY N/A 04/25/2020   Procedure: FLEXIBLE SIGMOIDOSCOPY;  Surgeon: Daneil Dolin, MD; incomplete colonoscopy with flex sig only due to poor prep.   HERNIA REPAIR     POLYPECTOMY  07/18/2020   Procedure: POLYPECTOMY;  Surgeon: Daneil Dolin, MD;  Location: [facility];  Service: Endoscopy;;   HPI:  This is a 76 year old male who presents by EMS with concerns for dizziness.  Patient states that he woke up this morning and felt weak and dizzy.  He has been nauseated.  He denies room spinning dizziness.  Patient reports that he drinks daily.  When asked, she drank last night he cannot quantify.  Patient states that every time he gets up "I just have to fall back down."  EMS noted that he  was able to ambulate to the stretcher.  He denies any abdominal pain but has had 1 episode of nonbilious, nonbloody emesis.  No recent illnesses or fevers. 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 Two small foci of acute infarction in the  right cerebellum. Patent vertebrobasilar arteries. 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 BSE completed on 09/26/21 initiating Dysphagia 3/thin w/ precautions; MBS completed and recommended continue current diet with minced meats and if impulsivity continues, downgrade diet for safety; f/u diet check/education with pt; pt downgraded to nectar-thickened liquids d/t consistent coughing likely d/t impulsivity.  SLE ordered.  Assessment / Plan / Recommendation Clinical Impression  Pt was administered a portion of the [place] Mental Status Examination (SLUMS) with a score of 9/26 obtained with deficits noted in the areas of attention and memory.   This may not be accurate as pt was distracted by current status (ataxia) and refused several of the prompts on the assessment (ie: written expression) or answered in general terms such as "I forgot all about them" or "I don't know anymore animals" despite encouragement to complete the tasks.  Pt was able to follow simple directives and answer simple questions, but exhibited impulsivity during assessment and during diet check following MBSS earlier this date.   He was oriented x4.  Pt asked the same questions repetitively and perseverated on information during the assessment.  Recommend f/u with ST for cognitive deficitts/ongoing assessment, as well as dysphagia rehab/tx.  SLP Assessment  SLP Recommendation/Assessment: Patient needs continued Speech Language Pathology Services SLP Visit Diagnosis: Cognitive communication deficit (R41.841);Attention and concentration deficit Attention and concentration deficit following: Cerebral infarction    Recommendations for follow up  therapy are one component of a multi-disciplinary discharge planning process, led by the attending physician.  Recommendations may be updated based on patient status, additional functional criteria and insurance authorization.    Follow Up Recommendations  Acute inpatient rehab (3hours/day)    Assistance Recommended at Discharge  Frequent or constant Supervision/Assistance  Functional Status Assessment Patient has had a recent decline in their functional status and demonstrates the ability to make significant improvements in function in a reasonable and predictable amount of time.  Frequency and Duration min 2x/week  1 week      SLP Evaluation Cognition  Overall Cognitive Status: Impaired/Different from baseline Arousal/Alertness: Awake/alert Orientation Level: Oriented X4 Year: 2023 Month: May Attention: Sustained Sustained Attention: Impaired Sustained Attention Impairment: Verbal basic;Functional basic Memory: Impaired Memory Impairment: Retrieval deficit;Decreased short term memory;Decreased recall of new information Decreased Short Term Memory: Verbal basic;Functional basic Awareness: Impaired Awareness Impairment: Anticipatory impairment Problem Solving: Impaired Problem Solving Impairment: Verbal basic;Functional basic Behaviors: Restless;Impulsive;Perseveration Safety/Judgment: Impaired       Comprehension  Auditory Comprehension Overall Auditory Comprehension: Impaired Yes/No Questions: Within Functional Limits Conversation: Simple Interfering Components: Attention;Working Curator: Not tested Reading Comprehension Reading Status: Not tested    Expression Expression Primary Mode of Expression: Verbal Verbal Expression Overall Verbal Expression: Appears within functional limits for tasks assessed Level of Generative/Spontaneous Verbalization: Conversation Naming: Not tested Pragmatics: Unable to  assess Interfering Components: Attention Non-Verbal Means of Communication: Not applicable Written Expression Dominant Hand: Right Written Expression: Unable to assess (comment) (d/t ataxia)   Oral / Motor  Oral Motor/Sensory Function Overall Oral Motor/Sensory Function: Within functional limits Motor Speech Overall Motor Speech: Appears within functional limits for tasks assessed Respiration: Within functional limits Phonation: Hoarse Resonance: Within functional limits Articulation: Within functional limitis Intelligibility: Intelligible Motor Planning: Witnin functional limits Motor Speech Errors: Not applicable            Elvina Sidle, M.S., CCC-SLP 09/27/2021, 2:55 PM

## 2021-09-27 NOTE — Consult Note (Signed)
I connected with  Arna Medici on 09/27/21 by a video enabled telemedicine application and verified that I am speaking with the correct person using two identifiers.   I discussed the limitations of evaluation and management by telemedicine. The patient expressed understanding and agreed to proceed.  Location of patient: [facility] Location of physician: [facility]   Neurology Consultation Reason for Consult: Stroke Referring Physician: Dr. Barton Dubois  CC: Weakness, dizziness, poor balance  History is obtained from: Chart review  HPI: Javier [name] is a 76 y.o. male with past medical history of hypertension, hyperlipidemia, GERD, alcohol use disorder who presented to the emergency room on 09/25/2021 with 2 days of dizziness and poor balance.  Of note, patient was able to talk but was not cooperative and providing history.  Therefore most of the history was obtained from chart review.    Per chart review he woke up in the morning on 09/23/2021 and felt dizzy and had difficulty walking.  He initially thought it would improve but when it did not improve, he eventually came to the hospital.  Last known normal: 09/23/2021 Event happened at home No tPA as outside window No thrombectomy as no large vessel occlusion mRS 0   ROS: Unable to obtain as patient was not answering questions  Past Medical History:  Diagnosis Date   Cataract    HLD (hyperlipidemia)    Hypertension     Family History  Problem Relation Age of Onset   Colon cancer Neg Hx    Stroke Mother    Heart attack Father     Social History:  reports that he has quit smoking. He has never used smokeless tobacco. He reports current alcohol use. He reports that he does not use drugs.  Medications Prior to Admission  Medication Sig Dispense Refill Last Dose   rosuvastatin (CRESTOR) 20 MG tablet Take 20 mg by mouth daily.    09/24/2021   amLODipine (NORVASC) 5 MG tablet Take 5 mg by mouth  daily.       cloNIDine (CATAPRES) 0.1 MG tablet Take by mouth daily.       ezetimibe (ZETIA) 10 MG tablet Take by mouth daily.       metoprolol succinate (TOPROL-XL) 50 MG 24 hr tablet Take 50 mg by mouth daily. Take with or immediately following a meal.       naproxen (NAPROSYN) 500 MG tablet Take 500 mg by mouth 2 (two) times daily with a meal.       olmesartan (BENICAR) 20 MG tablet Take 20 mg by mouth daily.       polyethylene glycol-electrolytes (TRILYTE) 420 g solution Take 4,000 mLs by mouth as directed. 4000 mL 0    REPATHA PUSHTRONEX SYSTEM 420 MG/3.5ML SOCT SMARTSIG:420 Milligram(s) SUB-Q Once a Month         Exam: Current vital signs: BP (!) 149/78 (BP Location: Left Arm)   Pulse 68   Temp 98.9 F (37.2 C) (Oral)   Resp 20   Ht 5' 11"  (1.803 m)   Wt 69.9 kg   SpO2 94%   BMI 21.48 kg/m  Vital signs in last 24 hours: Temp:  [98.2 F (36.8 C)-98.9 F (37.2 C)] 98.9 F (37.2 C) (05/26 0331) Pulse Rate:  [53-69] 68 (05/26 0331) Resp:  [17-20] 20 (05/26 0331) BP: (129-171)/(70-104) 149/78 (05/26 0331) SpO2:  [93 %-99 %] 94 % (05/26 0331)   Physical Exam  Constitutional: Appears well-developed and well-nourished.  Psych: At  the beginning of the conversation, patient was covering his face with his hand but intermittently trying to look at the examiner and the nurse.  Gradually as the conversation progressed, he started answering in monosyllables. Neuro: Awake, alert, able to tell me his name and that he is in the hospital, able to name 2 objects, follows commands, PERRLA, EOMI, blinks to threat bilaterally, no apparent facial asymmetry, antigravity strength in all 4 extremities without drift, per RN did have left sided ataxia   I have reviewed labs in epic and the results pertinent to this consultation are: CBC:  Recent Labs  Lab 09/25/21 0400  WBC 6.1  NEUTROABS 3.0  HGB 12.8*  HCT 39.4  MCV 93.6  PLT 782    Basic Metabolic Panel:  Lab Results  Component  Value Date   NA 136 09/25/2021   K 3.2 (L) 09/25/2021   CO2 24 09/25/2021   GLUCOSE 114 (H) 09/25/2021   BUN 23 09/25/2021   CREATININE 1.11 09/25/2021   CALCIUM 8.7 (L) 09/25/2021   GFRNONAA >60 09/25/2021   GFRAA  09/17/2009    >60        The eGFR has been calculated using the MDRD equation. This calculation has not been validated in all clinical situations. eGFR's persistently <60 mL/min signify possible Chronic Kidney Disease.   Lipid Panel:  Lab Results  Component Value Date   LDLCALC 108 (H) 09/26/2021   HgbA1c:  Lab Results  Component Value Date   HGBA1C 5.7 (H) 09/25/2021   Urine Drug Screen:     Component Value Date/Time   LABOPIA NONE DETECTED 09/25/2021 0452   COCAINSCRNUR NONE DETECTED 09/25/2021 0452   LABBENZ NONE DETECTED 09/25/2021 0452   AMPHETMU NONE DETECTED 09/25/2021 0452   THCU NONE DETECTED 09/25/2021 0452   LABBARB NONE DETECTED 09/25/2021 0452    Alcohol Level     Component Value Date/Time   ETH <10 09/25/2021 0400     I have reviewed the images obtained:  CT head without contrast 09/21/2021: No acute finding. 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  MRI brain without contrast 09/21/2021:Acute infarcts involving the left cerebellum extending into the dorsal left midbrain. Two small foci of acute infarction in the right cerebellum. Patent vertebrobasilar arteries. Left SCA may be occluded. 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 microvascular ischemic changes.  MR angio head without contrast 09/25/2021: Motion artifact is present. Intracranial internal carotid arteries are patent with atherosclerotic irregularity. Middle and anterior cerebral arteries are patent. Intracranial vertebral arteries are patent with atherosclerotic irregularity. Patent PICA origins, more irregular on the left. Likely patent AICA origins. Right SCA origin is patent. The left SCA origin is not  identified. Bilateral posterior communicating arteries are present. Posterior cerebral arteries are patent. Bilateral posterior communicating arteries are present.  US carotid bilateral 09/26/2021: Minimal amount of bilateral atherosclerotic plaque, right subjectively greater than left, not resulting in a hemodynamically significant stenosis within either internal carotid artery. Nonvisualization of the right vertebral artery, an age-indeterminate finding in the absence of prior examinations. Patent left vertebral artery.  TTE 09/21/2021: No thrombus, no PFO, left ventricular ejection fraction 65 to 70% with no regional wall motion abnormalities, left atrial size was mildly dilated    ASSESSMENT/PLAN: 76 year old male who presented with dizziness and gait abnormality and was noted to have left cerebellar infarct.  Acute ischemic stroke Chronic strokes Intracranial stenosis Alcohol use disorder -Etiology: Most likely atheroembolic versus cardioembolic  Recommendations: -Recommend  aspirin 81 mg plus Plavix 75 mg daily for 90 days followed by aspirin 81 mg daily -Continue rosuvastatin 20 mg daily for secondary stroke prevention -Recommend 30-day event monitor at the time of discharge still for paroxysmal A-fib With PT/OT/SLP/speech therapy -Goal blood pressure: Normotension -Discussed stroke education including BEFAST -Follow-up with neurology in 2 to 3 months  Thank you for allowing [place] to participate in the care of this patient. If you have any further questions, please contact  me or neurohospitalist.   Zeb Comfort Epilepsy [facility] neurohospitalist

## 2021-09-27 NOTE — Progress Notes (Signed)
Inpatient Rehab Admissions Coordinator:    Pt and wife now want CIR.Marland Kitchen Case sent to insurance for auth. I will follow and pursue for admit pending insurance auth.   Clemens Catholic, [place], [place] Admissions Coordinator  [phone] ([place]) [phone] (office)

## 2021-09-27 NOTE — Progress Notes (Signed)
Received a call from CCMD  that pt HR dropped to 39 but did not sustain. VS taken, pt HR was in 50s and asymptomatic. On call provider notified who requested to hold the AM beta blocker.

## 2021-09-27 NOTE — Progress Notes (Signed)
Physical Therapy Treatment Patient Details Name: Javier [name] MRN: [id number] DOB: 08/03/1945 Today's Date: 09/27/2021   History of Present Illness This is a 76 year old male who presents by EMS with concerns for dizziness.  Patient states that he woke up this morning and felt weak and dizzy.  He has been nauseated.  He denies room spinning dizziness.  Patient reports that he drinks daily.  When asked, she drank last night he cannot quantify.  Patient states that every time he gets up "I just have to fall back down."  EMS noted that he was able to ambulate to the stretcher.  He denies any abdominal pain but has had 1 episode of nonbilious, nonbloody emesis.  No recent illnesses or fevers.    PT Comments    Patient continues to be modified independent with supine to sit bed mobility with needing extended time to complete task. Patient has slightly improved transfer ability of sit to stand and bed to chair with need of min/mod assist compared to evaluation and previous sessions. Patient still utilizes RW for transfers with generalized weakness and impaired coordination still observed. Patient was able to ambulate further for 60 feet during session with mod assist and RW. Patient was able to ambulate with faster cadence but still needs cueing with RW for safety. Patient did not need a break during gait, but still demonstrated signs of fatigue with patient struggling with balance and coordination toward the end of treatment. Patient still presents with gait pattern deviations that are impacting overall walking ability. Patient left in chair with nursing notified of mobility status. Patient will benefit from continued skilled physical therapy in hospital and recommended venue below to increase strength, balance, endurance for safe ADLs and gait.    Recommendations for follow up therapy are one component of a multi-disciplinary discharge planning process, led by the attending physician.  Recommendations  may be updated based on patient status, additional functional criteria and insurance authorization.  Follow Up Recommendations  Acute inpatient rehab (3hours/day)     Assistance Recommended at Discharge Set up Supervision/Assistance  Patient can return home with the following A lot of help with walking and/or transfers;A lot of help with bathing/dressing/bathroom;Assist for transportation;Help with stairs or ramp for entrance;Assistance with cooking/housework   Equipment Recommendations  None recommended by PT;BSC/3in1;Rolling walker (2 wheels)    Recommendations for Other Services       Precautions / Restrictions Precautions Precautions: Fall Restrictions Weight Bearing Restrictions: No     Mobility  Bed Mobility Overal bed mobility: Modified Independent             General bed mobility comments: Patient is still modified independent with supien to sit but requires extended time    Transfers Overall transfer level: Needs assistance Equipment used: Rolling walker (2 wheels) Transfers: Sit to/from Stand, Bed to chair/wheelchair/BSC Sit to Stand: Min assist, Mod assist   Step pivot transfers: Mod assist       General transfer comment: Patient has improved with transfer ability with requiring min/mod assist but still presents with significant generalized weakness and impaired coordination impacting ability to transfer.    Ambulation/Gait Ambulation/Gait assistance: Mod assist Gait Distance (Feet): 60 Feet Assistive device: Rolling walker (2 wheels) Gait Pattern/deviations: Step-to pattern, Decreased step length - left, Decreased step length - right, Decreased stride length, Decreased weight shift to right, Wide base of support, Ataxic Gait velocity: decreased     General Gait Details: Patient still presents with labored cadence with severe ataxia but has  demonstrated minor improvements with gait speed, but needs cueing with RW for safety. Patient was able to  ambualte without a break during session but was still limited by fatigue. Gait pattern breakdown observed as patient became more fatigued with patient struggling with balance and coordination toward the end of gait.   Stairs             Wheelchair Mobility    Modified Rankin (Stroke Patients Only)       Balance Overall balance assessment: Needs assistance Sitting-balance support: Feet supported, Bilateral upper extremity supported Sitting balance-Leahy Scale: Fair Sitting balance - Comments: seated at EOB Postural control: Posterior lean Standing balance support: Bilateral upper extremity supported, During functional activity, Reliant on assistive device for balance Standing balance-Leahy Scale: Poor Standing balance comment: Patient reliant on RW for standing balance along with struggling with weight shifting and reaching outside BOS.                            Cognition Arousal/Alertness: Awake/alert Behavior During Therapy: WFL for tasks assessed/performed Overall Cognitive Status: Within Functional Limits for tasks assessed                                          Exercises General Exercises - Lower Extremity Long Arc Quad: Seated, AROM, Strengthening, Both, 10 reps Hip Flexion/Marching: Seated, AROM, Strengthening, Both, 10 reps Toe Raises: Seated, AROM, Strengthening, Both, 15 reps Heel Raises: Seated, AROM, Strengthening, Both, 15 reps    General Comments        Pertinent Vitals/Pain Pain Assessment Pain Assessment: No/denies pain    Home Living                          Prior Function            PT Goals (current goals can now be found in the care plan section) Acute Rehab PT Goals Patient Stated Goal: return home PT Goal Formulation: With patient Time For Goal Achievement: 10/10/21 Potential to Achieve Goals: Good Progress towards PT goals: Progressing toward goals    Frequency    Min 4X/week       PT Plan Current plan remains appropriate    Co-evaluation PT/OT/SLP Co-Evaluation/Treatment: Yes Reason for Co-Treatment: To address functional/ADL transfers PT goals addressed during session: Mobility/safety with mobility;Balance;Proper use of DME;Strengthening/ROM OT goals addressed during session: ADL's and self-care      AM-PAC PT "6 Clicks" Mobility   Outcome Measure  Help needed turning from your back to your side while in a flat bed without using bedrails?: None Help needed moving from lying on your back to sitting on the side of a flat bed without using bedrails?: A Little Help needed moving to and from a bed to a chair (including a wheelchair)?: A Lot Help needed standing up from a chair using your arms (e.g., wheelchair or bedside chair)?: A Lot Help needed to walk in hospital room?: A Lot Help needed climbing 3-5 steps with a railing? : A Lot 6 Click Score: 15    End of Session Equipment Utilized During Treatment: Gait belt Activity Tolerance: Patient tolerated treatment well;Patient limited by fatigue Patient left: in chair;with call bell/phone within reach;with chair alarm set Nurse Communication: Mobility status PT Visit Diagnosis: Unsteadiness on feet (R26.81);Other abnormalities of gait and mobility (R26.89);Muscle weakness (  generalized) (M62.81);Ataxic gait (R26.0)     Time: 2767-0110 PT Time Calculation (min) (ACUTE ONLY): 29 min  Charges:  $Gait Training: 8-22 mins $Therapeutic Exercise: 8-22 mins                     10:50 AM, 09/27/21 Lestine Box, S/PT

## 2021-09-27 NOTE — Plan of Care (Signed)

## 2021-09-27 NOTE — Progress Notes (Signed)
Speech Language Pathology Treatment: Dysphagia  Patient Details Name: Javier [name] MRN: [id number] DOB: [birth year] Today's Date: 09/27/2021 Time: 3299-2426 SLP Time Calculation (min) (ACUTE ONLY): 16 min  Assessment / Plan / Recommendation Clinical Impression  Pt seen for a f/u visit after MBS completed recommending Dysphagia 3(mech/soft) with thin liquids via small supervised amounts d/t impulsivity and trace aspiration with larger volumes.  Pt  consumed lunch tray with mod verbal cues for bite size and assistance with preparing foods via cutting to bite sized pieces for safety.  Pt appeared to tolerate small sips of thin via cup without overt s/s of aspiration, but bites of food were larger despite verbal/tactile cues for reducing size and resulted in delayed cough later in meal.  Pt was noted to be coughing frequently post-meal which could be concerning for aspiration if sips are not supervised.  May consider nectar-thickened liquids for brief period during meals with trials of thin via small sips with SLP supervised during treatment sessions for safety reasons.  ST will re-assess during treatment sessions for potential progression as pt improves with swallowing efficiency and cognitive ability.    HPI HPI: This is a 76 year old male who presents by EMS with concerns for dizziness.  Patient states that he woke up this morning and felt weak and dizzy.  He has been nauseated.  He denies room spinning dizziness.  Patient reports that he drinks daily.  When asked, she drank last night he cannot quantify.  Patient states that every time he gets up "I just have to fall back down."  EMS noted that he was able to ambulate to the stretcher.  He denies any abdominal pain but has had 1 episode of nonbilious, nonbloody emesis.  No recent illnesses or fevers. 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 Two small foci of acute infarction in the  right  cerebellum. Patent vertebrobasilar arteries. 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 BSE completed on 09/26/21 initiating Dysphagia 3/thin w/ precautions; MBS completed and recommended continue current diet with minced meats and if impulsivity continues, downgrade diet for safety; f/u diet check/education with pt.      SLP Plan  Continue with current plan of care      Recommendations for follow up therapy are one component of a multi-disciplinary discharge planning process, led by the attending physician.  Recommendations may be updated based on patient status, additional functional criteria and insurance authorization.    Recommendations  Diet recommendations: Dysphagia 3 (mechanical soft);Thin liquid (small amounts only; minced meats); may consider nectar-thickened liquids for safety Liquids provided via: Cup;No straw Medication Administration: Whole meds with puree Supervision: Patient able to self feed;Full supervision/cueing for compensatory strategies;Staff to assist with self feeding Compensations: Slow rate;Small sips/bites;Minimize environmental distractions;Multiple dry swallows after each bite/sip;Clear throat intermittently Postural Changes and/or Swallow Maneuvers: Seated upright 90 degrees                Oral Care Recommendations: Oral care BID Follow Up Recommendations: Acute inpatient rehab (3hours/day) Assistance recommended at discharge: Frequent or constant Supervision/Assistance SLP Visit Diagnosis: Dysphagia, oropharyngeal phase (R13.12) Plan: Continue with current plan of care           Elvina Sidle, M.S., CCC-SLP  09/27/2021, 12:47 PM

## 2021-09-27 NOTE — Consult Note (Signed)
Modified Barium Swallow Progress Note  Patient Details  Name: Javier [name] MRN: [id number] Date of Birth: 04/17/1946  Today's Date: 09/27/2021  Modified Barium Swallow completed.  Full report located under Chart Review in the Imaging Section.  Brief recommendations include the following:  Clinical Impression  Pt presents with mild oropharyngeal dysphagia characterized by impaired mastication (likely d/t edentulous state, pt does not have dentures present for study), delay in the initiaton of the swallow to valleculae and one incidence to the pyriform sinuses (with thin via tsp).  Decreased laryngeal elevation noted during study which impacted thin via larger volumes (successive swallows) via cup d/t decreased airway closure and trace aspiration which was cleared via throat clear or cough with cueing from SLP.  Smaller volumes of thin liquids did not elicit this response.  Good oral/pharyngeal clearance noted throughout study and no evidence of aspiration observed with any other consistency.  Pt with impulsive tendency, so larger volumes overall place him at a higher aspiration risk. paired with impulsivity.  Recommend continuing Dysphagia 3/thin via small SUPERVISED amounts.  FULL supervision required during meal intake with precautions in place at all times.   Swallow Evaluation Recommendations       SLP Diet Recommendations: Dysphagia 3 (Mech soft) solids;Thin liquid   Liquid Administration via: Cup;No straw (small amounts supervised**)   Medication Administration: Whole meds with puree   Supervision: Patient able to self feed;Full supervision/cueing for compensatory strategies;Staff to assist with self feeding   Compensations: Slow rate;Small sips/bites;Minimize environmental distractions;Clear throat intermittently;Multiple dry swallows after each bite/sip   Postural Changes: Seated upright at 90 degrees   Oral Care Recommendations: Oral care BID;Staff/trained caregiver to  provide oral care        Elvina Sidle, M.S., CCC-SLP 09/27/2021,12:16 PM

## 2021-09-27 NOTE — Progress Notes (Signed)
Occupational Therapy Treatment Patient Details Name: LESLIE LANGILLE MRN: [id number] DOB: 1945-12-25 Today's Date: 09/27/2021   History of present illness This is a 76 year old male who presents by EMS with concerns for dizziness.  Patient states that he woke up this morning and felt weak and dizzy.  He has been nauseated.  He denies room spinning dizziness.  Patient reports that he drinks daily.  When asked, she drank last night he cannot quantify.  Patient states that every time he gets up "I just have to fall back down."  EMS noted that he was able to ambulate to the stretcher.  He denies any abdominal pain but has had 1 episode of nonbilious, nonbloody emesis.  No recent illnesses or fevers.   OT comments  Pt. Agreeable to OT/PT co treatment. Pt, completed bed mobility with VC for bed rails. He demonstrated posterior lean while seated EOB. Completed sit to stand t/f with mod assist + RW. Required min assist for functional mobilty to bathroom + VC to keep walker close to body. Pt. Completed toilet t/f with mod assist and required mod assist for posterior hygiene. Required min assist while washing hands standing at sink level. Pt. Completed face washing with set up seated in chair. Discussed importance of completing rehab at a CIR verses going home with faimly due to level of assistance required. Pt. Was left in chair with call bell and chair alarm set.    Recommendations for follow up therapy are one component of a multi-disciplinary discharge planning process, led by the attending physician.  Recommendations may be updated based on patient status, additional functional criteria and insurance authorization.    Follow Up Recommendations  Acute inpatient rehab (3hours/day)    Assistance Recommended at Discharge Intermittent Supervision/Assistance  Patient can return home with the following  A lot of help with walking and/or transfers;A little help with bathing/dressing/bathroom;Assistance with  cooking/housework;Assistance with feeding;Assist for transportation;Help with stairs or ramp for entrance   Equipment Recommendations  None recommended by OT    Recommendations for Other Services      Precautions / Restrictions Precautions Precautions: Fall Restrictions Weight Bearing Restrictions: No       Mobility Bed Mobility Overal bed mobility: Modified Independent             General bed mobility comments: supine to sit without physical assist    Transfers Overall transfer level: Needs assistance Equipment used: Rolling walker (2 wheels) Transfers: Sit to/from Stand, Bed to chair/wheelchair/BSC Sit to Stand: Min assist, Mod assist     Step pivot transfers: Mod assist           Balance Overall balance assessment: Needs assistance Sitting-balance support: Feet supported, Bilateral upper extremity supported Sitting balance-Leahy Scale: Fair Sitting balance - Comments: seated at EOB Postural control: Posterior lean Standing balance support: Bilateral upper extremity supported, During functional activity, Reliant on assistive device for balance Standing balance-Leahy Scale: Poor Standing balance comment: using RW                           ADL either performed or assessed with clinical judgement   ADL Overall ADL's : Needs assistance/impaired Eating/Feeding: Set up;Sitting;Minimal assistance Eating/Feeding Details (indicate cue type and reason): Assist to open milk, fruit cup, and apple juice. Spilling coffee on self and tray due to ataxic movement. Grooming: Minimal assistance;Sitting Grooming Details (indicate cue type and reason): washed hands with min assist while standing at the sink, washed face with  set up sitting in chair.                 Toilet Transfer: Moderate assistance;Ambulation Toilet Transfer Details (indicate cue type and reason): Completed toilet t/f with RW and mod assist Toileting- Clothing Manipulation and Hygiene:  Moderate assistance Toileting - Clothing Manipulation Details (indicate cue type and reason): required mod assist for posterior toilet hygiene     Functional mobility during ADLs: Moderate assistance;Rolling walker (2 wheels) General ADL Comments: Pt able to ambulate in hall but with moderate assist due to ataxic movements and poor balance overall.    Extremity/Trunk Assessment Upper Extremity Assessment Upper Extremity Assessment: LUE deficits/detail;RUE deficits/detail RUE Deficits / Details: Ataxic with coordination deficits but 4+/5 grossly. RUE Coordination: decreased gross motor;decreased fine motor LUE Deficits / Details: 3-/5 MMT shoulder flexion; 4 to 4+ grossly for elbow flexion, extension, wrist extension, and grip. LUE Coordination: decreased fine motor;decreased gross motor            Vision       Perception     Praxis      Cognition Arousal/Alertness: Awake/alert Behavior During Therapy: WFL for tasks assessed/performed Overall Cognitive Status: Within Functional Limits for tasks assessed                                                             Pertinent Vitals/ Pain       Pain Assessment Pain Assessment: No/denies pain  Home Living                                          Prior Functioning/Environment              Frequency  Min 2X/week        Progress Toward Goals  OT Goals(current goals can now be found in the care plan section)  Progress towards OT goals: Progressing toward goals  Acute Rehab OT Goals Patient Stated Goal: go home OT Goal Formulation: With patient Time For Goal Achievement: 10/10/21 Potential to Achieve Goals: Fair ADL Goals Pt Will Perform Eating: with modified independence;with adaptive utensils;sitting Pt Will Perform Grooming: with modified independence;sitting Pt Will Perform Lower Body Bathing: with modified independence;sitting/lateral leans;with adaptive  equipment Pt Will Perform Upper Body Dressing: with modified independence;sitting Pt Will Perform Lower Body Dressing: with modified independence;sitting/lateral leans Pt Will Transfer to Toilet: with supervision;squat pivot transfer Pt Will Perform Toileting - Clothing Manipulation and hygiene: with modified independence;sitting/lateral leans Pt/caregiver will Perform Home Exercise Program: Increased ROM;Increased strength;Left upper extremity;Independently  Plan Discharge plan remains appropriate    Co-evaluation    PT/OT/SLP Co-Evaluation/Treatment: Yes Reason for Co-Treatment: To address functional/ADL transfers   OT goals addressed during session: ADL's and self-care      AM-PAC OT "6 Clicks" Daily Activity     Outcome Measure   Help from another person eating meals?: A Little Help from another person taking care of personal grooming?: A Little Help from another person toileting, which includes using toliet, bedpan, or urinal?: A Little Help from another person bathing (including washing, rinsing, drying)?: A Little Help from another person to put on and taking off regular upper body clothing?: A Little Help from another person  to put on and taking off regular lower body clothing?: A Little 6 Click Score: 18    End of Session Equipment Utilized During Treatment: Gait belt;Rolling walker (2 wheels)  OT Visit Diagnosis: Unsteadiness on feet (R26.81);Other abnormalities of gait and mobility (R26.89);Ataxia, unspecified (R27.0);Muscle weakness (generalized) (M62.81)   Activity Tolerance Patient tolerated treatment well   Patient Left in chair;with call bell/phone within reach   Nurse Communication Mobility status        Time: 8832-5498 OT Time Calculation (min): 27 min  Charges: OT General Charges $OT Visit: 1 Visit OT Treatments $Self Care/Home Management : 23-37 mins  Arvil Persons, OTR/L   Frederic Jericho 09/27/2021, 9:46 AM

## 2021-09-28 DIAGNOSIS — I639 Cerebral infarction, unspecified: Secondary | ICD-10-CM | POA: Diagnosis not present

## 2021-09-28 NOTE — Progress Notes (Signed)
Patient seen and examined.  No overnight events.  This morning complaining of mild headache.  Denies chest pain, palpitations, nausea, vomiting or any other complaints.  So far tolerating dysphagia 3 diet with thin liquids.  He remains hemodynamically stable and at this point has completed his stroke work-up.  Following recommendations for CIR to pursuit rehabilitation and conditioning; awaiting insurance authorization.  Please refer to discharge summary written on 09/26/2021 for further info/details of instructions and recommendations.  Barton Dubois MD [phone]

## 2021-09-28 NOTE — Progress Notes (Signed)
Pt requested that we reach out to his Primary Care Physician to let them know he is in the hospital. Pt unsure of number of facility or facility name. Attempted to call his wife to get that information with no answer.

## 2021-09-28 NOTE — Progress Notes (Signed)
Patient assisted by tech to chair upon entering room patient sitting up in chair with complaints of HA. Tylenol given. NIH score 2. Call light within reach. Will continue to monitor patient.

## 2021-09-29 DIAGNOSIS — I639 Cerebral infarction, unspecified: Secondary | ICD-10-CM | POA: Diagnosis not present

## 2021-09-29 NOTE — Progress Notes (Signed)
Patient seen and examined.  No overnight events.  Reports no headache, no chest pain, no nausea, no vomiting, no palpitations, he is afebrile and hemodynamically stable.  Still awaiting final decision from insurance authorization and inpatient rehab unit.  Tolerating well dysphagia 3 diet with thin liquids.  Please refer to discharge summary written on 09/26/2021 for further info/details of instructions and recommendations.  Patient is medically stable.  Wife updated at bedside.  Barton Dubois MD [phone]

## 2021-09-29 NOTE — Progress Notes (Signed)
Pt's wife brought PCP/clinic info in for [place]. Attempted to call @ [phone] [facility] with no answer. Pt and family informed.

## 2021-09-30 DIAGNOSIS — E785 Hyperlipidemia, unspecified: Secondary | ICD-10-CM | POA: Diagnosis not present

## 2021-09-30 DIAGNOSIS — K219 Gastro-esophageal reflux disease without esophagitis: Secondary | ICD-10-CM | POA: Diagnosis not present

## 2021-09-30 DIAGNOSIS — I10 Essential (primary) hypertension: Secondary | ICD-10-CM | POA: Diagnosis not present

## 2021-09-30 DIAGNOSIS — I639 Cerebral infarction, unspecified: Secondary | ICD-10-CM | POA: Diagnosis not present

## 2021-09-30 DIAGNOSIS — R7303 Prediabetes: Secondary | ICD-10-CM

## 2021-09-30 NOTE — Progress Notes (Signed)
Speech Language Pathology Treatment: Dysphagia  Patient Details Name: Javier [name] MRN: [id number] DOB: [birth year] Today's Date: 09/30/2021 Time: [id number] SLP Time Calculation (min) (ACUTE ONLY): 28 min  Assessment / Plan / Recommendation Clinical Impression  Pt seen for ongoing dysphagia intervention following MBSS completed on Friday. Pt was initially recommended for small sips of thin, however was changed to NTL following dysphagia treatment session due to noted impulsivity. Pt reports that he doesn't mind the thickened liquids and also reports that he's "always coughed" when queried. Pt presented with trials of thin water via cup sip and he continues to present with delayed, but strong coughing on 50% of trials (despite small sips). Pt self presented NTL via cups sips with no coughing observed. Pt needed reminders regarding reasons for aspiration precautions and responded verbally with, "Am I going to be able to walk again?". Continue diet as ordered, D3 and NTL (NECTAR-THICK) via cup sips, no straws, and po medications whole in puree. Continue dysphagia therapy at next venue of care and further assess cognition.   HPI HPI: This is a 76 year old male who presents by EMS with concerns for dizziness.  Patient states that he woke up this morning and felt weak and dizzy.  He has been nauseated.  He denies room spinning dizziness.  Patient reports that he drinks daily.  When asked, she drank last night he cannot quantify.  Patient states that every time he gets up "I just have to fall back down."  EMS noted that he was able to ambulate to the stretcher.  He denies any abdominal pain but has had 1 episode of nonbilious, nonbloody emesis.  No recent illnesses or fevers. 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 Two small foci of acute infarction in the  right cerebellum. Patent vertebrobasilar arteries. 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 BSE completed on  09/26/21 initiating Dysphagia 3/thin w/ precautions; MBS completed and recommended continue current diet with minced meats and if impulsivity continues, downgrade diet for safety; f/u diet check/education with pt.      SLP Plan  Continue with current plan of care      Recommendations for follow up therapy are one component of a multi-disciplinary discharge planning process, led by the attending physician.  Recommendations may be updated based on patient status, additional functional criteria and insurance authorization.    Recommendations  Diet recommendations: Dysphagia 3 (mechanical soft);Nectar-thick liquid Liquids provided via: Cup;No straw Medication Administration: Whole meds with puree Supervision: Patient able to self feed;Full supervision/cueing for compensatory strategies;Staff to assist with self feeding Compensations: Slow rate;Small sips/bites;Minimize environmental distractions;Multiple dry swallows after each bite/sip;Clear throat intermittently Postural Changes and/or Swallow Maneuvers: Seated upright 90 degrees                Oral Care Recommendations: Oral care BID Follow Up Recommendations: Acute inpatient rehab (3hours/day) Assistance recommended at discharge: Frequent or constant Supervision/Assistance SLP Visit Diagnosis: Dysphagia, oropharyngeal phase (R13.12) Plan: Continue with current plan of care         Thank you,  Genene Churn, [place]   [place]  09/30/2021, 2:08 PM

## 2021-09-30 NOTE — Progress Notes (Signed)
Progress Note   Patient: Javier [name] [id number]:[id number] DOB: 07-Jun-1945 DOA: 09/25/2021     0  DOS: the patient was seen and examined on 09/30/2021   Brief hospital course: Javier [name] is a 76 y.o. male with medical history significant of hypertension, hyperlipidemia and gastroesophageal reflux disease who presented to the hospital secondary to dizziness and poor balance.  Patient reports symptom has been present for the last 24-48 hours and that he woke up feeling that way when the symptoms started.  He thought that they would go away but unfortunately, that has not been the case; he has remained dizzy when changing positions and expressed unbalance gait.  Patient denies chest pain, shortness of breath, fever, coughing spells, dysuria, hematuria, diarrhea, melena, hematochezia, blurry vision or any other complaints. Work-up in the ED has demonstrated no acute hemorrhagic changes on CT scan; positive MRI for cerebellar infarcts that would explain patient's symptoms and presentation.  Discussed with neurology service who has recommended admission to the hospital to expedite stroke work-up.    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  -LDL 108, triglycerides 90, total cholesterol 172 and HDL 46.  Patient will be continue on a statin.   -A1c 5.7; lifestyle modifications and carbohydrate diet discussed with patient and he is meeting criteria for prediabetes.   -2D echo and carotid Dopplers stable and not demonstrating acute source for patient stroke. -After discussing with neurology plan will be for 42-monthdual antiplatelet therapy using aspirin and Plavix with subsequent use of daily aspirin for prevention. -Continue risk factor modifications.   Gastroesophageal reflux disease -Continue PPI.  Hyperlipidemia -Continue the use of  Crestor.  Essential hypertension - Resume home antihypertensive agents -Close monitoring of patient blood pressure with further adjustment to his medications as needed.   Subjective:  No overnight events; patient hemodynamically stable.  Waiting insurance authorization for discharge purposes to CIR.  Physical Exam: Vitals:   09/29/21 0609 09/29/21 1325 09/29/21 2125 09/30/21 0542  BP: (!) 146/83 (!) 152/78 (!) 169/80 (!) 156/73  Pulse: 72 (!) 51 (!) 54 (!) 51  Resp:  '16 17 18  '$ Temp: 97.9 F (36.6 C) 98.5 F (36.9 C) 98.9 F (37.2 C) 98.2 F (36.8 C)  TempSrc:   Oral Oral  SpO2: 94% 98% 95% 97%  Weight:      Height:       General exam: Alert, awake, oriented x 3; able to follow commands appropriately; some dysmetria, poor balance and lack of coordination on the left side has been appreciated.  No pronator drift.  Patient denies chest pain, nausea, vomiting and shortness of breath. Respiratory system: Clear to auscultation. Respiratory effort normal.  No using accessory muscles. Cardiovascular system: Rate controlled, no rubs, no gallops, no JVD. Gastrointestinal system: Abdomen is nondistended, soft and nontender. No organomegaly or masses felt. Normal bowel sounds heard. Central nervous system: Alert and oriented.  Muscle strength appears to be 5 out of 5 on his right side; 4 out of 5 on the left side mainly affecting lower extremity.  Positive dysmetria. Extremities: No cyanosis or clubbing. Skin: No rashes or petechiae. Psychiatry: Judgement and insight appear normal. Mood & affect appropriate.    Data Reviewed: No new data to review; CBC and basic metabolic panel has been ordered for tomorrow morning.  Family Communication: Wife at bedside.  Disposition: Status is: Observation  Planned Discharge Destination:  To be determined; recommendation for CIR.  Currently barriers: insurance authorization.  Patient has been medically stable since 09/26/2021.   Author: Barton Dubois, MD 09/30/2021 9:22 AM  For on call review [email or web address].

## 2021-09-30 NOTE — PMR Pre-admission (Signed)
PMR Admission Coordinator Pre-Admission Assessment  Patient: Javier [name] is an 76 y.o., male MRN: [id number] DOB: [birth year] Height: 5' 11"  (180.3 cm) Weight: 69.9 kg  Insurance Information HMO:     PPO:      PCP:      IPA:      80/20:      OTHER:  PRIMARY: UHC Medicare       Policy#: [id number]      Subscriber: pt CM Name:       Phone#: [phone]     Fax#: [phone]  Pre-Cert#: [id number]       Employer:  [employer] at [employer] called with authorization on 10/01/21. She stated Pt. Approved for admit 5/29-6/4 Benefits:  Phone #:      Name:  Irene Shipper Date: 09/02/2021 - still active Deductible: does not have one OOP Max: $8,300 ($0 met) CIR: $1,556/ admission copay SNF: $0.00 Copayment per day for days 1-20; $200 Copayment per day for days 21-100; Maximum of 100 days/benefit period Outpatient: 80% coverage; 20% co-insurance Home Health:  100% coverage, limited by medical necessity DME: 80% coverage; 20% co-insurance Providers: in network  Secondary: Medicaid of [place]  [id number]  Financial Counselor:      Phone#:   The "Data Collection Information Summary" for patients in Inpatient Rehabilitation Facilities with attached "Privacy Act [place] Records" was provided and verbally reviewed with: Patient  Emergency Contact Information Contact Information     Name Relation Home Work [place] [phone]  [phone]       Current Medical History  Patient Admitting Diagnosis: CVA History of Present Illness: Javier [name] is a 76 y.o. male with medical history significant of hypertension, hyperlipidemia and gastroesophageal reflux disease who presented to the [facility] ED secondary to dizziness and poor balance on 09/25/21. Work-up in the ED has demonstrated no acute hemorrhagic changes on CT scan; positive MRI for cerebellar infarcts Pt. Was seen by PT/OT/SLP during admission and they recommended CIR to assist return to PLOF    Complete NIHSS  TOTAL: 2  Patient's medical record from [facility]  has been reviewed by the rehabilitation admission coordinator and physician.  Past Medical History  Past Medical History:  Diagnosis Date   Cataract    HLD (hyperlipidemia)    Hypertension     Has the patient had major surgery during 100 days prior to admission? No  Family History   family history includes Heart attack in his father; Stroke in his mother.  Current Medications  Current Facility-Administered Medications:    0.9 %  sodium chloride infusion, , Intravenous, Continuous, Barton Dubois, MD, Last Rate: 50 mL/hr at 09/29/21 1704, New Bag at 09/29/21 1704   acetaminophen (TYLENOL) tablet 650 mg, 650 mg, Oral, Q4H PRN, 650 mg at 09/28/21 1044 **OR** acetaminophen (TYLENOL) 160 MG/5ML solution 650 mg, 650 mg, Per Tube, Q4H PRN **OR** acetaminophen (TYLENOL) suppository 650 mg, 650 mg, Rectal, Q4H PRN, Barton Dubois, MD   aspirin chewable tablet 81 mg, 81 mg, Oral, Daily, Zeb Comfort O, MD, 81 mg at 09/30/21 1124   benzonatate (TESSALON) capsule 100 mg, 100 mg, Oral, Q8H PRN, Barton Dubois, MD, 100 mg at 09/29/21 1549   clopidogrel (PLAVIX) tablet 75 mg, 75 mg, Oral, Daily, Barton Dubois, MD, 75 mg at 09/30/21 1125   enoxaparin (LOVENOX) injection 40 mg, 40 mg, Subcutaneous, Q24H, Barton Dubois, MD, 40 mg at 09/30/21 1125   metoprolol succinate (TOPROL-XL) 24 hr tablet 50 mg, 50 mg, Oral, Daily,  Barton Dubois, MD, 50 mg at 09/30/21 1125   pantoprazole (PROTONIX) EC tablet 40 mg, 40 mg, Oral, Daily, Barton Dubois, MD, 40 mg at 09/30/21 1125   rosuvastatin (CRESTOR) tablet 20 mg, 20 mg, Oral, Daily, Barton Dubois, MD, 20 mg at 09/30/21 1125  Patients Current Diet:  Diet Order             DIET DYS 3 Room service appropriate? Yes; Fluid consistency: Nectar Thick  Diet effective now           Diet - low sodium heart healthy                   Precautions / Restrictions Precautions Precautions:  Fall Restrictions Weight Bearing Restrictions: No   Has the patient had 2 or more falls or a fall with injury in the past year? Yes  Prior Activity Level Limited Community (1-2x/wk): Pt. went out for appointments  Prior Functional Level Self Care: Did the patient need help bathing, dressing, using the toilet or eating? Independent  Indoor Mobility: Did the patient need assistance with walking from room to room (with or without device)? Independent  Stairs: Did the patient need assistance with internal or external stairs (with or without device)? Independent  Functional Cognition: Did the patient need help planning regular tasks such as shopping or remembering to take medications? Independent  Patient Information Are you of Hispanic, Latino/a,or Spanish origin?: A. No, not of Hispanic, Latino/a, or Spanish origin What is your race?: B. Black or African American Do you need or want an interpreter to communicate with a doctor or health care staff?: 0. No  Patient's Response To:  Health Literacy and Transportation Is the patient able to respond to health literacy and transportation needs?: Yes Health Literacy - How often do you need to have someone help you when you read instructions, pamphlets, or other written material from your doctor or pharmacy?: Never In the past 12 months, has lack of transportation kept you from medical appointments or from getting medications?: Yes In the past 12 months, has lack of transportation kept you from meetings, work, or from getting things needed for daily living?: No  Home Assistive Devices / [place] Devices/Equipment: Dentures (specify type) (At home) Home Equipment: Conservation officer, nature (2 wheels)  Prior Device Use: Indicate devices/aids used by the patient prior to current illness, exacerbation or injury?  cane  Current Functional Level Cognition  Arousal/Alertness: Awake/alert Overall Cognitive Status: Impaired/Different from  baseline Orientation Level: Oriented to person, Oriented to place, Disoriented to time, Disoriented to situation Attention: Sustained Sustained Attention: Impaired Sustained Attention Impairment: Verbal basic, Functional basic Memory: Impaired Memory Impairment: Retrieval deficit, Decreased short term memory, Decreased recall of new information Decreased Short Term Memory: Verbal basic, Functional basic Awareness: Impaired Awareness Impairment: Anticipatory impairment Problem Solving: Impaired Problem Solving Impairment: Verbal basic, Functional basic Behaviors: Restless, Impulsive, Perseveration Safety/Judgment: Impaired    Extremity Assessment (includes Sensation/Coordination)  Upper Extremity Assessment: LUE deficits/detail, RUE deficits/detail RUE Deficits / Details: Ataxic with coordination deficits but 4+/5 grossly. RUE Sensation: WNL RUE Coordination: decreased gross motor, decreased fine motor LUE Deficits / Details: 3-/5 MMT shoulder flexion; 4 to 4+ grossly for elbow flexion, extension, wrist extension, and grip. LUE Sensation: decreased proprioception LUE Coordination: decreased fine motor, decreased gross motor  Lower Extremity Assessment: Defer to PT evaluation    ADLs  Overall ADL's : Needs assistance/impaired Eating/Feeding: Set up, Sitting, Minimal assistance Eating/Feeding Details (indicate cue type and reason): Assist to open  milk, fruit cup, and apple juice. Spilling coffee on self and tray due to ataxic movement. Grooming: Minimal assistance, Sitting Grooming Details (indicate cue type and reason): washed hands with min assist while standing at the sink, washed face with set up sitting in chair. Upper Body Bathing: Minimal assistance, Sitting Lower Body Bathing: Min guard, Minimal assistance, Sitting/lateral leans Upper Body Dressing : Supervision/safety, Min guard, Sitting Lower Body Dressing: Supervision/safety, Min guard, Sitting/lateral leans Lower Body  Dressing Details (indicate cue type and reason): able to doff and done L sock seated at EOB with labored effort and extended time Toilet Transfer: Moderate assistance, Ambulation Toilet Transfer Details (indicate cue type and reason): Completed toilet t/f with RW and mod assist Toileting- Clothing Manipulation and Hygiene: Moderate assistance Toileting - Clothing Manipulation Details (indicate cue type and reason): required mod assist for posterior toilet hygiene Tub/ Shower Transfer: Minimal assistance, Tub bench, Stand-pivot Functional mobility during ADLs: Moderate assistance, Rolling walker (2 wheels) General ADL Comments: Pt able to ambulate in hall but with moderate assist due to ataxic movements and poor balance overall.    Mobility  Overal bed mobility: Modified Independent General bed mobility comments: Patient is still modified independent with supien to sit but requires extended time    Transfers  Overall transfer level: Needs assistance Equipment used: Rolling walker (2 wheels) Transfers: Sit to/from Stand, Bed to chair/wheelchair/BSC Sit to Stand: Min assist, Mod assist Bed to/from chair/wheelchair/BSC transfer type:: Step pivot Step pivot transfers: Mod assist General transfer comment: Patient has improved with transfer ability with requiring min/mod assist but still presents with significant generalized weakness and impaired coordination impacting ability to transfer.    Ambulation / Gait / Stairs / Wheelchair Mobility  Ambulation/Gait Ambulation/Gait assistance: Mod assist Gait Distance (Feet): 60 Feet Assistive device: Rolling walker (2 wheels) Gait Pattern/deviations: Step-to pattern, Decreased step length - left, Decreased step length - right, Decreased stride length, Decreased weight shift to right, Wide base of support, Ataxic General Gait Details: Patient still presents with labored cadence with severe ataxia but has demonstrated minor improvements with gait speed,  but needs cueing with RW for safety. Patient was able to ambualte without a break during session but was still limited by fatigue. Gait pattern breakdown observed as patient became more fatigued with patient struggling with balance and coordination toward the end of gait. Gait velocity: decreased    Posture / Balance Dynamic Sitting Balance Sitting balance - Comments: seated at EOB Balance Overall balance assessment: Needs assistance Sitting-balance support: Feet supported, Bilateral upper extremity supported Sitting balance-Leahy Scale: Fair Sitting balance - Comments: seated at EOB Postural control: Posterior lean Standing balance support: Bilateral upper extremity supported, During functional activity, Reliant on assistive device for balance Standing balance-Leahy Scale: Poor Standing balance comment: Patient reliant on RW for standing balance along with struggling with weight shifting and reaching outside BOS.    Special needs/care consideration Special service needs none   Previous Home Environment (from acute therapy documentation) Living Arrangements: Spouse/significant other  Lives With: Spouse Available Help at Discharge: Family, Available 24 hours/day Type of Home: House Home Layout: One level Home Access: Stairs to enter Entrance Stairs-Rails: None Entrance Stairs-Number of Steps: 2 Bathroom Shower/Tub: Chiropodist: Handicapped height Bathroom Accessibility: Yes [place]: No Additional Comments: taken via PT note  Discharge Living Setting Plans for Discharge Living Setting: Patient's home Type of Home at Discharge: House Discharge Home Layout: One level Discharge Home Access: Stairs to enter Entrance Stairs-Rails: None Entrance [employer]  of Steps: 2 Discharge Bathroom Shower/Tub: Tub/shower unit Discharge Bathroom Toilet: Handicapped height Discharge Bathroom Accessibility: Yes How Accessible: Accessible via walker Does the  patient have any problems obtaining your medications?: No  Social/Family/Support Systems Patient Roles: Spouse Contact Information: [phone] Anticipated Caregiver: Bronco Mcgrory Ability/Limitations of Caregiver: Can provide 24/7 min A Caregiver Availability: 24/7 Discharge Plan Discussed with Primary Caregiver: Yes Is Caregiver In Agreement with Plan?: Yes Does Caregiver/Family have Issues with Lodging/Transportation while Pt is in Rehab?: No  Goals Patient/Family Goal for Rehab: PT/OT/SLP Supervision Expected length of stay: 12-14 days Pt/Family Agrees to Admission and willing to participate: Yes Program Orientation Provided & Reviewed with Pt/Caregiver Including Roles  & Responsibilities: Yes  Decrease burden of Care through IP rehab admission: Specialzed equipment needs, Decrease number of caregivers, Bowel and bladder program, and Patient/family education  Possible need for SNF placement upon discharge: not anticipated  Patient Condition: I have reviewed medical records from [facility] , spoken with CM, and patient and spouse. I discussed via phone for inpatient rehabilitation assessment.  Patient will benefit from ongoing PT, OT, and SLP, can actively participate in 3 hours of therapy a day 5 days of the week, and can make measurable gains during the admission.  Patient will also benefit from the coordinated team approach during an Inpatient Acute Rehabilitation admission.  The patient will receive intensive therapy as well as Rehabilitation physician, nursing, social worker, and care management interventions.  Due to safety, skin/wound care, disease management, medication administration, pain management, and patient education the patient requires 24 hour a day rehabilitation nursing.  The patient is currently min A with mobility and basic ADLs.  Discharge setting and therapy post discharge at home with home health is anticipated.  Patient has agreed to participate  in the Acute Inpatient Rehabilitation Program and will admit today.  Preadmission Screen Completed By:  Genella Mech, 09/30/2021 1:51 PM ______________________________________________________________________   Discussed status with Dr. Naaman Plummer on 10/01/21 at 2 and received approval for admission today.  Admission Coordinator:  Genella Mech, CCC-SLP, time 1025/Date 10/01/21   Assessment/Plan: Diagnosis: cerebellar infarcts Does the need for close, 24 hr/day Medical supervision in concert with the patient's rehab needs make it unreasonable for this patient to be served in a less intensive setting? Yes Co-Morbidities requiring supervision/potential complications: htn, gerd, post-stroke sequelae Due to bladder management, bowel management, safety, skin/wound care, disease management, medication administration, pain management, and patient education, does the patient require 24 hr/day rehab nursing? Yes Does the patient require coordinated care of a physician, rehab nurse, PT, OT, and SLP to address physical and functional deficits in the context of the above medical diagnosis(es)? Yes Addressing deficits in the following areas: balance, endurance, locomotion, strength, transferring, bowel/bladder control, bathing, dressing, feeding, grooming, toileting, cognition, and psychosocial support Can the patient actively participate in an intensive therapy program of at least 3 hrs of therapy 5 days a week? Yes The potential for patient to make measurable gains while on inpatient rehab is excellent Anticipated functional outcomes upon discharge from inpatient rehab: supervision PT, supervision OT, supervision SLP Estimated rehab length of stay to reach the above functional goals is: 12-14 days Anticipated discharge destination: Home 10. Overall Rehab/Functional Prognosis: excellent   MD Signature: Meredith Staggers, MD, [place] Director  Rehabilitation Services 10/01/2021

## 2021-09-30 NOTE — Progress Notes (Signed)
Inpatient Rehab Admissions Coordinator:     I do not yet have insurance auth or a CIR bed for this pt. Today I will continue to follow for potential admit pending insurance auth.   Clemens Catholic, [place], [place] Admissions Coordinator  [phone] ([place]) [phone] (office)

## 2021-10-01 ENCOUNTER — Inpatient Hospital Stay (HOSPITAL_COMMUNITY)
Admission: RE | Admit: 2021-10-01 | Discharge: 2021-10-09 | DRG: 057 | Disposition: A | Discharge status: Other Acute Inpatient Hospital | Payer: Medicare Other | Source: Other Acute Inpatient Hospital | Attending: Physical Medicine & Rehabilitation | Admitting: Physical Medicine & Rehabilitation

## 2021-10-01 DIAGNOSIS — E876 Hypokalemia: Secondary | ICD-10-CM | POA: Diagnosis present

## 2021-10-01 DIAGNOSIS — R7303 Prediabetes: Secondary | ICD-10-CM | POA: Diagnosis present

## 2021-10-01 DIAGNOSIS — Z741 Need for assistance with personal care: Secondary | ICD-10-CM | POA: Diagnosis present

## 2021-10-01 DIAGNOSIS — R1310 Dysphagia, unspecified: Secondary | ICD-10-CM | POA: Diagnosis not present

## 2021-10-01 DIAGNOSIS — Z79899 Other long term (current) drug therapy: Secondary | ICD-10-CM

## 2021-10-01 DIAGNOSIS — E785 Hyperlipidemia, unspecified: Secondary | ICD-10-CM | POA: Diagnosis present

## 2021-10-01 DIAGNOSIS — R1312 Dysphagia, oropharyngeal phase: Secondary | ICD-10-CM | POA: Diagnosis present

## 2021-10-01 DIAGNOSIS — Z8249 Family history of ischemic heart disease and other diseases of the circulatory system: Secondary | ICD-10-CM | POA: Diagnosis not present

## 2021-10-01 DIAGNOSIS — Z86010 Personal history of colonic polyps: Secondary | ICD-10-CM

## 2021-10-01 DIAGNOSIS — Z823 Family history of stroke: Secondary | ICD-10-CM | POA: Diagnosis not present

## 2021-10-01 DIAGNOSIS — Z7982 Long term (current) use of aspirin: Secondary | ICD-10-CM | POA: Diagnosis not present

## 2021-10-01 DIAGNOSIS — I10 Essential (primary) hypertension: Secondary | ICD-10-CM | POA: Diagnosis present

## 2021-10-01 DIAGNOSIS — I69391 Dysphagia following cerebral infarction: Secondary | ICD-10-CM | POA: Diagnosis not present

## 2021-10-01 DIAGNOSIS — I639 Cerebral infarction, unspecified: Secondary | ICD-10-CM | POA: Diagnosis not present

## 2021-10-01 DIAGNOSIS — Z5189 Encounter for other specified aftercare: Secondary | ICD-10-CM

## 2021-10-01 DIAGNOSIS — Z7902 Long term (current) use of antithrombotics/antiplatelets: Secondary | ICD-10-CM | POA: Diagnosis not present

## 2021-10-01 DIAGNOSIS — R059 Cough, unspecified: Secondary | ICD-10-CM | POA: Diagnosis present

## 2021-10-01 DIAGNOSIS — Z87891 Personal history of nicotine dependence: Secondary | ICD-10-CM | POA: Diagnosis not present

## 2021-10-01 DIAGNOSIS — K219 Gastro-esophageal reflux disease without esophagitis: Secondary | ICD-10-CM | POA: Diagnosis present

## 2021-10-01 DIAGNOSIS — E440 Moderate protein-calorie malnutrition: Secondary | ICD-10-CM | POA: Diagnosis not present

## 2021-10-01 DIAGNOSIS — I471 Supraventricular tachycardia, unspecified: Secondary | ICD-10-CM

## 2021-10-01 DIAGNOSIS — I69393 Ataxia following cerebral infarction: Principal | ICD-10-CM

## 2021-10-01 DIAGNOSIS — D649 Anemia, unspecified: Secondary | ICD-10-CM | POA: Diagnosis present

## 2021-10-01 LAB — BASIC METABOLIC PANEL
Anion gap: 7 (ref 5–15)
BUN: 11 mg/dL (ref 8–23)
CO2: 27 mmol/L (ref 22–32)
Calcium: 8.2 mg/dL — ABNORMAL LOW (ref 8.9–10.3)
Chloride: 103 mmol/L (ref 98–111)
Creatinine, Ser: 0.73 mg/dL (ref 0.61–1.24)
GFR, Estimated: >60 mL/min (ref >60)
Glucose, Bld: 101 mg/dL — ABNORMAL HIGH (ref 70–99)
Potassium: 3.2 mmol/L — ABNORMAL LOW (ref 3.5–5.1)
Sodium: 137 mmol/L (ref 135–145)

## 2021-10-01 LAB — CBC
HCT: 32.9 % — ABNORMAL LOW (ref 39.0–52.0)
Hemoglobin: 10.8 g/dL — ABNORMAL LOW (ref 13.0–17.0)
MCH: 31.1 pg (ref 26.0–34.0)
MCHC: 32.8 g/dL (ref 30.0–36.0)
MCV: 94.8 fL (ref 80.0–100.0)
Platelets: 220 10*3/uL (ref 150–400)
RBC: 3.47 MIL/uL — ABNORMAL LOW (ref 4.22–5.81)
RDW: 13.2 % (ref 11.5–15.5)
WBC: 5.3 10*3/uL (ref 4.0–10.5)
nRBC: 0 % (ref 0.0–0.2)

## 2021-10-01 MED ORDER — PANTOPRAZOLE SODIUM 40 MG PO TBEC
40.0000 mg | DELAYED_RELEASE_TABLET | Freq: Every day | ORAL | Status: DC
  Administered 2021-10-02 – 2021-10-09 (×8): 40 mg via ORAL
  Filled 2021-10-01 (×8): qty 1

## 2021-10-01 MED ORDER — PROCHLORPERAZINE EDISYLATE 10 MG/2ML IJ SOLN: 5.0000 mg | Freq: Four times a day (QID) | INTRAMUSCULAR | Status: DC | PRN

## 2021-10-01 MED ORDER — METHOCARBAMOL 500 MG PO TABS
500.0000 mg | ORAL_TABLET | Freq: Four times a day (QID) | ORAL | Status: DC | PRN
  Filled 2021-10-01: qty 1

## 2021-10-01 MED ORDER — ENOXAPARIN SODIUM 40 MG/0.4ML IJ SOSY: 40.0000 mg | PREFILLED_SYRINGE | INTRAMUSCULAR | Status: DC

## 2021-10-01 MED ORDER — PROCHLORPERAZINE 25 MG RE SUPP: 12.5000 mg | Freq: Four times a day (QID) | RECTAL | Status: DC | PRN

## 2021-10-01 MED ORDER — ACETAMINOPHEN 325 MG PO TABS
325.0000 mg | ORAL_TABLET | ORAL | Status: DC | PRN
  Administered 2021-10-07 (×2): 650 mg via ORAL
  Filled 2021-10-01 (×3): qty 2

## 2021-10-01 MED ORDER — BISACODYL 10 MG RE SUPP: 10.0000 mg | Freq: Every day | RECTAL | Status: DC | PRN

## 2021-10-01 MED ORDER — METOPROLOL SUCCINATE ER 50 MG PO TB24
50.0000 mg | ORAL_TABLET | Freq: Every day | ORAL | Status: DC
  Administered 2021-10-02 – 2021-10-07 (×6): 50 mg via ORAL
  Filled 2021-10-01 (×6): qty 1

## 2021-10-01 MED ORDER — METOPROLOL TARTRATE 5 MG/5ML IV SOLN
INTRAVENOUS | Status: AC
  Administered 2021-10-01: 5 mg via INTRAVENOUS
  Filled 2021-10-01: qty 5

## 2021-10-01 MED ORDER — ASPIRIN 81 MG PO CHEW
81.0000 mg | CHEWABLE_TABLET | Freq: Every day | ORAL | Status: DC
  Administered 2021-10-02 – 2021-10-09 (×8): 81 mg via ORAL
  Filled 2021-10-01 (×8): qty 1

## 2021-10-01 MED ORDER — ENOXAPARIN SODIUM 40 MG/0.4ML IJ SOSY
40.0000 mg | PREFILLED_SYRINGE | Freq: Every day | INTRAMUSCULAR | Status: DC
  Administered 2021-10-02 – 2021-10-09 (×8): 40 mg via SUBCUTANEOUS
  Filled 2021-10-01 (×8): qty 0.4

## 2021-10-01 MED ORDER — ALUM & MAG HYDROXIDE-SIMETH 200-200-20 MG/5ML PO SUSP: 30.0000 mL | Freq: Four times a day (QID) | ORAL | Status: DC | PRN

## 2021-10-01 MED ORDER — GUAIFENESIN-DM 100-10 MG/5ML PO SYRP
5.0000 mL | ORAL_SOLUTION | Freq: Four times a day (QID) | ORAL | Status: DC | PRN
  Administered 2021-10-05 – 2021-10-08 (×3): 10 mL via ORAL
  Filled 2021-10-01 (×4): qty 10

## 2021-10-01 MED ORDER — METOPROLOL TARTRATE 5 MG/5ML IV SOLN: 5.0000 mg | Freq: Once | INTRAVENOUS | Status: AC

## 2021-10-01 MED ORDER — DIPHENHYDRAMINE HCL 12.5 MG/5ML PO ELIX: 12.5000 mg | ORAL_SOLUTION | Freq: Four times a day (QID) | ORAL | Status: DC | PRN

## 2021-10-01 MED ORDER — TRAZODONE HCL 50 MG PO TABS
25.0000 mg | ORAL_TABLET | Freq: Every evening | ORAL | Status: DC | PRN
  Filled 2021-10-01: qty 1

## 2021-10-01 MED ORDER — MAGNESIUM HYDROXIDE 400 MG/5ML PO SUSP: 30.0000 mL | Freq: Every day | ORAL | Status: DC | PRN

## 2021-10-01 MED ORDER — BENZONATATE 100 MG PO CAPS
100.0000 mg | ORAL_CAPSULE | Freq: Three times a day (TID) | ORAL | Status: DC | PRN
  Administered 2021-10-05: 100 mg via ORAL
  Filled 2021-10-01: qty 1

## 2021-10-01 MED ORDER — PROCHLORPERAZINE MALEATE 5 MG PO TABS: 5.0000 mg | ORAL_TABLET | Freq: Four times a day (QID) | ORAL | Status: DC | PRN

## 2021-10-01 MED ORDER — CLOPIDOGREL BISULFATE 75 MG PO TABS
75.0000 mg | ORAL_TABLET | Freq: Every day | ORAL | Status: DC
  Administered 2021-10-02 – 2021-10-09 (×8): 75 mg via ORAL
  Filled 2021-10-01 (×8): qty 1

## 2021-10-01 MED ORDER — POTASSIUM CHLORIDE CRYS ER 20 MEQ PO TBCR
40.0000 meq | EXTENDED_RELEASE_TABLET | Freq: Once | ORAL | Status: AC
  Administered 2021-10-01: 40 meq via ORAL
  Filled 2021-10-01: qty 2

## 2021-10-01 MED ORDER — ALUM & MAG HYDROXIDE-SIMETH 200-200-20 MG/5ML PO SUSP: 30.0000 mL | ORAL | Status: DC | PRN

## 2021-10-01 MED ORDER — ROSUVASTATIN CALCIUM 20 MG PO TABS
20.0000 mg | ORAL_TABLET | Freq: Every day | ORAL | Status: DC
  Administered 2021-10-02 – 2021-10-09 (×8): 20 mg via ORAL
  Filled 2021-10-01 (×8): qty 1

## 2021-10-01 MED ORDER — FLEET ENEMA 7-19 GM/118ML RE ENEM: 1.0000 | ENEMA | Freq: Once | RECTAL | Status: DC | PRN

## 2021-10-01 NOTE — Progress Notes (Signed)
   10/01/21 1115  Vitals  BP 138/78  MAP (mmHg) 90  BP Location Left Arm  BP Method Automatic  Patient Position (if appropriate) Sitting  Pulse Rate 72  Pulse Rate Source Dinamap  ECG Heart Rate 72  Resp 20  Level of Consciousness  Level of Consciousness Alert  MEWS COLOR  MEWS Score Color Green  Oxygen Therapy  SpO2 100 %  O2 Device Room Air  Pain Assessment  Pain Scale 0-10  Pain Score 0  MEWS Score  MEWS Temp 0  MEWS Systolic 0  MEWS Pulse 0  MEWS RR 0  MEWS LOC 0  MEWS Score 0   Pt has received '5mg'$  Lopressor IV per order of MD [place]. HR decreased to 72-76/min sinus rhythm, blood pressure normalized, pt with no further c/o of stomach upset. MD Dyann Kief spoke with both pt and wife regarding pending transfer to [facility]. Will continue to monitor heart rate through this afternoon and if no further episodes of tachycardia, he would transfer to CIR. Both pt and wife state understanding.

## 2021-10-01 NOTE — Progress Notes (Addendum)
Inpatient Rehabilitation Admission Medication Review by a Pharmacist  A complete drug regimen review was completed for this patient to identify any potential clinically significant medication issues.  High Risk Drug Classes Is patient taking? Indication by Medication  Antipsychotic Yes Prn Compazine for N/V  Anticoagulant Yes Lovenox for VTE ppx  Antibiotic No   Opioid No   Antiplatelet Yes Plavix, aspirin for CVA  Hypoglycemics/insulin No   Vasoactive Medication Yes Metoprolol for BP  Chemotherapy No   Other Yes Protonix for Gerd Rosuvastatin for HLD     Type of Medication Issue Identified Description of Issue Recommendation(s)  Drug Interaction(s) (clinically significant)     Duplicate Therapy     Allergy     No Medication Administration End Date     Incorrect Dose     Additional Drug Therapy Needed     Significant med changes from prior encounter (inform family/care partners about these prior to discharge). Amlodipine, olmesartan, clonidine, naproxen on PTA to rehab/hospital  Zetia, Repatha for HLD Resume if and when appropriate  Other       Clinically significant medication issues were identified that warrant physician communication and completion of prescribed/recommended actions by midnight of the next day:  No  Pharmacist comments: None  Time spent performing this drug regimen review (minutes):  20 minutes   Tad Moore 10/01/2021 11:48 AM

## 2021-10-01 NOTE — Progress Notes (Signed)
   10/01/21 1047  Vitals  BP (!) 137/115  MAP (mmHg) 123  BP Location Left Arm  BP Method Automatic  Patient Position (if appropriate) Sitting  Pulse Rate (!) 147  Pulse Rate Source Dinamap  ECG Heart Rate (!) 146  Resp 20  Level of Consciousness  Level of Consciousness Alert  MEWS COLOR  MEWS Score Color Yellow  Oxygen Therapy  SpO2 99 %  O2 Device Room Air  Pain Assessment  Pain Scale 0-10  Pain Score 0  MEWS Score  MEWS Temp 0  MEWS Systolic 0  MEWS Pulse 3  MEWS RR 0  MEWS LOC 0  MEWS Score 3   Pt remains in ST 145-147, B/P as above. Pt denies c/o SOB or chest pain, states stomach feels upset, pt states this is from the chocolate milk he drank this am. Skin warm and dry, lungs clear. MD Dyann Kief notified via [facility] page. MD Dyann Kief in to room to evaluate pt. EKG ordered.

## 2021-10-01 NOTE — Progress Notes (Addendum)
Physical Therapy Treatment Patient Details Name: Javier [name] MRN: [id number] DOB: March 17, 1946 Today's Date: 10/01/2021   History of Present Illness This is a 76 year old male who presents by EMS with concerns for dizziness.  Patient states that he woke up this morning and felt weak and dizzy.  He has been nauseated.  He denies room spinning dizziness.  Patient reports that he drinks daily.  When asked, she drank last night he cannot quantify.  Patient states that every time he gets up "I just have to fall back down."  EMS noted that he was able to ambulate to the stretcher.  He denies any abdominal pain but has had 1 episode of nonbilious, nonbloody emesis.  No recent illnesses or fevers.    PT Comments    Patient has demonstrated slight improvement with sit to stand transfer with RW. Patient was able to complete sit to stand 2x with decreased level of assistance than previous sessions but still presents with generalized weakness and impaired coordination impacting balance during transfers. Patient was able to tolerate therapeutic exercises with demonstrating ability to perform exercises more efficiently and was active with participation. Patient was able to ambulate for 15 feet with min/mod assist in the room with RW but was limited by hemodynamic response of increased blood pressure. Vitals were assessed after therapeutic exercises with patients HR reading 146 and was in therapeutic range to continue treatment with ambulation. Vitals were assessed once patient began to ambulate in the room, but patient's HR had increased to 181 indicating patient was outside of therapeutic range for treatment given current diagnosis. Patient was then advised to ambulate toward the chair where he was left until HR became stable. Nursing staff was present and notified of abnormal hemodynamic response. Patient SpO2 was at 96%.  Patient will benefit from continued skilled physical therapy in hospital and recommended  venue below to increase strength, balance, endurance for safe ADLs and gait.    Recommendations for follow up therapy are one component of a multi-disciplinary discharge planning process, led by the attending physician.  Recommendations may be updated based on patient status, additional functional criteria and insurance authorization.  Follow Up Recommendations  Acute inpatient rehab (3hours/day)     Assistance Recommended at Discharge Set up Supervision/Assistance  Patient can return home with the following A lot of help with walking and/or transfers;A lot of help with bathing/dressing/bathroom;Assist for transportation;Help with stairs or ramp for entrance;Assistance with cooking/housework   Equipment Recommendations  BSC/3in1;Rolling walker (2 wheels)    Recommendations for Other Services       Precautions / Restrictions Precautions Precautions: Fall Restrictions Weight Bearing Restrictions: No     Mobility  Bed Mobility                    Transfers Overall transfer level: Needs assistance Equipment used: Rolling walker (2 wheels) Transfers: Sit to/from Stand Sit to Stand: Min assist           General transfer comment: Patient able to perform sit to stand transfer 2x with min assist and RW. Patient has demonstrated slight improvement with ability to transfer with decreased of assistance level but still presents with generalized weakness and coordination impairments impacting balance during transfers.    Ambulation/Gait Ambulation/Gait assistance: Min assist, Mod assist Gait Distance (Feet): 15 Feet Assistive device: Rolling walker (2 wheels) Gait Pattern/deviations: Step-to pattern, Decreased step length - left, Decreased step length - right, Decreased stride length, Decreased weight shift to right, Wide base of  support, Ataxic Gait velocity: decreased     General Gait Details: Patient is was min/mod assist with ambulation with need of RW, but was limited  due to hemodynamic response of HR reading 181. SpO2 was monitored at 96% but pt HR was outside of therapeutic range with regards to patient diagnosis.   Stairs             Wheelchair Mobility    Modified Rankin (Stroke Patients Only)       Balance Overall balance assessment: Needs assistance Sitting-balance support: Feet supported, Bilateral upper extremity supported Sitting balance-Leahy Scale: Fair Sitting balance - Comments: seated at EOB Postural control: Posterior lean Standing balance support: Bilateral upper extremity supported, During functional activity, Reliant on assistive device for balance Standing balance-Leahy Scale: Fair Standing balance comment: Patient has slightly improved standing balance with less reliance on RW but still continuing to struggle with weight shifting and reaching outside BOS.                            Cognition Arousal/Alertness: Awake/alert Behavior During Therapy: WFL for tasks assessed/performed Overall Cognitive Status: Within Functional Limits for tasks assessed                                          Exercises General Exercises - Lower Extremity Long Arc Quad: Seated, AROM, Strengthening, Both, 15 reps Hip Flexion/Marching: Seated, AROM, Strengthening, Both, 15 reps Toe Raises: Seated, AROM, Strengthening, Both, 20 reps Heel Raises: Seated, AROM, Strengthening, Both, 20 reps    General Comments        Pertinent Vitals/Pain Pain Assessment Pain Assessment: No/denies pain    Home Living                          Prior Function            PT Goals (current goals can now be found in the care plan section) Acute Rehab PT Goals Patient Stated Goal: return home PT Goal Formulation: With patient Time For Goal Achievement: 10/10/21 Potential to Achieve Goals: Good Progress towards PT goals: Progressing toward goals    Frequency    Min 4X/week      PT Plan Current plan  remains appropriate    Co-evaluation     PT goals addressed during session: Mobility/safety with mobility;Balance;Proper use of DME;Strengthening/ROM        AM-PAC PT "6 Clicks" Mobility   Outcome Measure  Help needed turning from your back to your side while in a flat bed without using bedrails?: None Help needed moving from lying on your back to sitting on the side of a flat bed without using bedrails?: None Help needed moving to and from a bed to a chair (including a wheelchair)?: A Lot Help needed standing up from a chair using your arms (e.g., wheelchair or bedside chair)?: A Lot Help needed to walk in hospital room?: A Lot Help needed climbing 3-5 steps with a railing? : A Lot 6 Click Score: 16    End of Session Equipment Utilized During Treatment: Gait belt Activity Tolerance: Patient tolerated treatment well;Patient limited by fatigue Patient left: in chair;with call bell/phone within reach;with chair alarm set Nurse Communication: Mobility status PT Visit Diagnosis: Unsteadiness on feet (R26.81);Other abnormalities of gait and mobility (R26.89);Muscle weakness (generalized) (M62.81);Ataxic gait (R26.0)  Time: 7998-7215 PT Time Calculation (min) (ACUTE ONLY): 24 min  Charges:  $Therapeutic Exercise: 8-22 mins $Therapeutic Activity: 8-22 mins                     12:39 PM, 10/01/21 Lestine Box, S/PT

## 2021-10-01 NOTE — PMR Pre-admission (Shared)
PMR Admission Coordinator Pre-Admission Assessment   Patient: Javier [name] is an 76 y.o., male MRN: [id number] DOB: 09/01/1945 Height: _0  (180.3 cm) Weight: 69.9 kg   Insurance Information HMO:     PPO:      PCP:      IPA:      80/20:      OTHER:  PRIMARY: U[employer] Medicare       Policy#: [id number]      Subscriber: pt CM Name:       Phone#: [phone]     Fax#: [phone]  Pre-Cert#: [id number]       Employer:  [employer] called with authorization on 10/01/21. She stated Pt. Approved for admit 5/29-6/4 Benefits:  Phone #:      Name:  Irene Shipper Date: 09/02/2021 - still active Deductible: does not have one OOP Max: $8,300 ($0 met) CIR: $1,556/ admission copay SNF: $0.00 Copayment per day for days 1-20; $200 Copayment per day for days 21-100; Maximum of 100 days/benefit period Outpatient: 80% coverage; 20% co-insurance Home Health:  100% coverage, limited by medical necessity DME: 80% coverage; 20% co-insurance Providers: in network   Secondary: Medicaid of [place]  [id number]   Financial Counselor:      Phone#:    The "Data Collection Information Summary" for patients in Inpatient Rehabilitation Facilities with attached "Privacy Act [place] Records" was provided and verbally reviewed with: Patient   Emergency Contact Information Contact Information       Name Relation Home Work [place] [phone]   [phone]           Current Medical History  Patient Admitting Diagnosis: CVA History of Present Illness: Javier [name] is a 76 y.o. male with medical history significant of hypertension, hyperlipidemia and gastroesophageal reflux disease who presented to the [facility] ED secondary to dizziness and poor balance on 09/25/21. Work-up in the ED has demonstrated no acute hemorrhagic changes on CT scan; positive MRI for cerebellar infarcts Pt. Was seen by PT/OT/SLP during admission and they recommended CIR to assist return to PLOF     Complete NIHSS TOTAL: 2   Patient's medical record from [facility]  has been reviewed by the rehabilitation admission coordinator and physician.   Past Medical History      Past Medical History:  Diagnosis Date   Cataract     HLD (hyperlipidemia)     Hypertension        Has the patient had major surgery during 100 days prior to admission? No   Family History   family history includes Heart attack in his father; Stroke in his mother.   Current Medications   Current Facility-Administered Medications:    0.9 %  sodium chloride infusion, , Intravenous, Continuous, Barton Dubois, MD, Last Rate: 50 mL/hr at 09/29/21 1704, New Bag at 09/29/21 1704   acetaminophen (TYLENOL) tablet 650 mg, 650 mg, Oral, Q4H PRN, 650 mg at 09/28/21 1044 **OR** acetaminophen (TYLENOL) 160 MG/5ML solution 650 mg, 650 mg, Per Tube, Q4H PRN **OR** acetaminophen (TYLENOL) suppository 650 mg, 650 mg, Rectal, Q4H PRN, Barton Dubois, MD   aspirin chewable tablet 81 mg, 81 mg, Oral, Daily, Zeb Comfort O, MD, 81 mg at 09/30/21 1124   benzonatate (TESSALON) capsule 100 mg, 100 mg, Oral, Q8H PRN, Barton Dubois, MD, 100 mg at 09/29/21 1549   clopidogrel (PLAVIX) tablet 75 mg, 75 mg, Oral, Daily, Barton Dubois, MD, 75 mg at 09/30/21 1125   enoxaparin (LOVENOX) injection  40 mg, 40 mg, Subcutaneous, Q24H, Barton Dubois, MD, 40 mg at 09/30/21 1125   metoprolol succinate (TOPROL-XL) 24 hr tablet 50 mg, 50 mg, Oral, Daily, Barton Dubois, MD, 50 mg at 09/30/21 1125   pantoprazole (PROTONIX) EC tablet 40 mg, 40 mg, Oral, Daily, Barton Dubois, MD, 40 mg at 09/30/21 1125   rosuvastatin (CRESTOR) tablet 20 mg, 20 mg, Oral, Daily, Barton Dubois, MD, 20 mg at 09/30/21 1125   Patients Current Diet:  Diet Order                  DIET DYS 3 Room service appropriate? Yes; Fluid consistency: Nectar Thick  Diet effective now             Diet - low sodium heart healthy                         Precautions /  Restrictions Precautions Precautions: Fall Restrictions Weight Bearing Restrictions: No    Has the patient had 2 or more falls or a fall with injury in the past year? Yes   Prior Activity Level Limited Community (1-2x/wk): Pt. went out for appointments   Prior Functional Level Self Care: Did the patient need help bathing, dressing, using the toilet or eating? Independent   Indoor Mobility: Did the patient need assistance with walking from room to room (with or without device)? Independent   Stairs: Did the patient need assistance with internal or external stairs (with or without device)? Independent   Functional Cognition: Did the patient need help planning regular tasks such as shopping or remembering to take medications? Independent   Patient Information Are you of Hispanic, Latino/a,or Spanish origin?: A. No, not of Hispanic, Latino/a, or Spanish origin What is your race?: B. Black or African American Do you need or want an interpreter to communicate with a doctor or health care staff?: 0. No   Patient's Response To:  Health Literacy and Transportation Is the patient able to respond to health literacy and transportation needs?: Yes Health Literacy - How often do you need to have someone help you when you read instructions, pamphlets, or other written material from your doctor or pharmacy?: Never In the past 12 months, has lack of transportation kept you from medical appointments or from getting medications?: Yes In the past 12 months, has lack of transportation kept you from meetings, work, or from getting things needed for daily living?: No   Home Assistive Devices / [place] Devices/Equipment: Dentures (specify type) (At home) Home Equipment: Conservation officer, nature (2 wheels)   Prior Device Use: Indicate devices/aids used by the patient prior to current illness, exacerbation or injury?  cane   Current Functional Level Cognition   Arousal/Alertness:  Awake/alert Overall Cognitive Status: Impaired/Different from baseline Orientation Level: Oriented to person, Oriented to place, Disoriented to time, Disoriented to situation Attention: Sustained Sustained Attention: Impaired Sustained Attention Impairment: Verbal basic, Functional basic Memory: Impaired Memory Impairment: Retrieval deficit, Decreased short term memory, Decreased recall of new information Decreased Short Term Memory: Verbal basic, Functional basic Awareness: Impaired Awareness Impairment: Anticipatory impairment Problem Solving: Impaired Problem Solving Impairment: Verbal basic, Functional basic Behaviors: Restless, Impulsive, Perseveration Safety/Judgment: Impaired    Extremity Assessment (includes Sensation/Coordination)   Upper Extremity Assessment: LUE deficits/detail, RUE deficits/detail RUE Deficits / Details: Ataxic with coordination deficits but 4+/5 grossly. RUE Sensation: WNL RUE Coordination: decreased gross motor, decreased fine motor LUE Deficits / Details: 3-/5 MMT shoulder flexion; 4 to 4+  grossly for elbow flexion, extension, wrist extension, and grip. LUE Sensation: decreased proprioception LUE Coordination: decreased fine motor, decreased gross motor  Lower Extremity Assessment: Defer to PT evaluation     ADLs   Overall ADL's : Needs assistance/impaired Eating/Feeding: Set up, Sitting, Minimal assistance Eating/Feeding Details (indicate cue type and reason): Assist to open milk, fruit cup, and apple juice. Spilling coffee on self and tray due to ataxic movement. Grooming: Minimal assistance, Sitting Grooming Details (indicate cue type and reason): washed hands with min assist while standing at the sink, washed face with set up sitting in chair. Upper Body Bathing: Minimal assistance, Sitting Lower Body Bathing: Min guard, Minimal assistance, Sitting/lateral leans Upper Body Dressing : Supervision/safety, Min guard, Sitting Lower Body Dressing:  Supervision/safety, Min guard, Sitting/lateral leans Lower Body Dressing Details (indicate cue type and reason): able to doff and done L sock seated at EOB with labored effort and extended time Toilet Transfer: Moderate assistance, Ambulation Toilet Transfer Details (indicate cue type and reason): Completed toilet t/f with RW and mod assist Toileting- Clothing Manipulation and Hygiene: Moderate assistance Toileting - Clothing Manipulation Details (indicate cue type and reason): required mod assist for posterior toilet hygiene Tub/ Shower Transfer: Minimal assistance, Tub bench, Stand-pivot Functional mobility during ADLs: Moderate assistance, Rolling walker (2 wheels) General ADL Comments: Pt able to ambulate in hall but with moderate assist due to ataxic movements and poor balance overall.     Mobility   Overal bed mobility: Modified Independent General bed mobility comments: Patient is still modified independent with supien to sit but requires extended time     Transfers   Overall transfer level: Needs assistance Equipment used: Rolling walker (2 wheels) Transfers: Sit to/from Stand, Bed to chair/wheelchair/BSC Sit to Stand: Min assist, Mod assist Bed to/from chair/wheelchair/BSC transfer type:: Step pivot Step pivot transfers: Mod assist General transfer comment: Patient has improved with transfer ability with requiring min/mod assist but still presents with significant generalized weakness and impaired coordination impacting ability to transfer.     Ambulation / Gait / Stairs / Wheelchair Mobility   Ambulation/Gait Ambulation/Gait assistance: Mod assist Gait Distance (Feet): 60 Feet Assistive device: Rolling walker (2 wheels) Gait Pattern/deviations: Step-to pattern, Decreased step length - left, Decreased step length - right, Decreased stride length, Decreased weight shift to right, Wide base of support, Ataxic General Gait Details: Patient still presents with labored cadence with  severe ataxia but has demonstrated minor improvements with gait speed, but needs cueing with RW for safety. Patient was able to ambualte without a break during session but was still limited by fatigue. Gait pattern breakdown observed as patient became more fatigued with patient struggling with balance and coordination toward the end of gait. Gait velocity: decreased     Posture / Balance Dynamic Sitting Balance Sitting balance - Comments: seated at EOB Balance Overall balance assessment: Needs assistance Sitting-balance support: Feet supported, Bilateral upper extremity supported Sitting balance-Leahy Scale: Fair Sitting balance - Comments: seated at EOB Postural control: Posterior lean Standing balance support: Bilateral upper extremity supported, During functional activity, Reliant on assistive device for balance Standing balance-Leahy Scale: Poor Standing balance comment: Patient reliant on RW for standing balance along with struggling with weight shifting and reaching outside BOS.     Special needs/care consideration Special service needs none    Previous Home Environment (from acute therapy documentation) Living Arrangements: Spouse/significant other  Lives With: Spouse Available Help at Discharge: Family, Available 24 hours/day Type of Home: House Home Layout: One level Home  Access: Stairs to enter Entrance Stairs-Rails: None Entrance Stairs-Number of Steps: 2 Bathroom Shower/Tub: Chiropodist: Handicapped height Bathroom Accessibility: Yes Home Care Services: No Additional Comments: taken via PT note   Discharge Living Setting Plans for Discharge Living Setting: Patient's home Type of Home at Discharge: House Discharge Home Layout: One level Discharge Home Access: Stairs to enter Entrance Stairs-Rails: None Entrance Stairs-Number of Steps: 2 Discharge Bathroom Shower/Tub: Tub/shower unit Discharge Bathroom Toilet: Handicapped height Discharge  Bathroom Accessibility: Yes How Accessible: Accessible via walker Does the patient have any problems obtaining your medications?: No   Social/Family/Support Systems Patient Roles: Spouse Contact Information: [phone] Anticipated Caregiver: Marshel Golubski Ability/Limitations of Caregiver: Can provide 24/7 min A Caregiver Availability: 24/7 Discharge Plan Discussed with Primary Caregiver: Yes Is Caregiver In Agreement with Plan?: Yes Does Caregiver/Family have Issues with Lodging/Transportation while Pt is in Rehab?: No   Goals Patient/Family Goal for Rehab: PT/OT/SLP Supervision Expected length of stay: 12-14 days Pt/Family Agrees to Admission and willing to participate: Yes Program Orientation Provided & Reviewed with Pt/Caregiver Including Roles  & Responsibilities: Yes   Decrease burden of Care through IP rehab admission: Specialzed equipment needs, Decrease number of caregivers, Bowel and bladder program, and Patient/family education   Possible need for SNF placement upon discharge: not anticipated   Patient Condition: I have reviewed medical records from [facility] , spoken with CM, and patient and spouse. I discussed via phone for inpatient rehabilitation assessment.  Patient will benefit from ongoing PT, OT, and SLP, can actively participate in 3 hours of therapy a day 5 days of the week, and can make measurable gains during the admission.  Patient will also benefit from the coordinated team approach during an Inpatient Acute Rehabilitation admission.  The patient will receive intensive therapy as well as Rehabilitation physician, nursing, social worker, and care management interventions.  Due to safety, skin/wound care, disease management, medication administration, pain management, and patient education the patient requires 24 hour a day rehabilitation nursing.  The patient is currently min A with mobility and basic ADLs.  Discharge setting and therapy post  discharge at home with home health is anticipated.  Patient has agreed to participate in the Acute Inpatient Rehabilitation Program and will admit today.   Preadmission Screen Completed By:  Genella Mech, 09/30/2021 1:51 PM ______________________________________________________________________   Discussed status with Dr. Naaman Plummer on 10/01/21 at 87 and received approval for admission today.   Admission Coordinator:  Genella Mech, CCC-SLP, time 1025/Date 10/01/21    Assessment/Plan: Diagnosis: cerebellar infarcts Does the need for close, 24 hr/day Medical supervision in concert with the patient's rehab needs make it unreasonable for this patient to be served in a less intensive setting? Yes Co-Morbidities requiring supervision/potential complications: htn, gerd, post-stroke sequelae Due to bladder management, bowel management, safety, skin/wound care, disease management, medication administration, pain management, and patient education, does the patient require 24 hr/day rehab nursing? Yes Does the patient require coordinated care of a physician, rehab nurse, PT, OT, and SLP to address physical and functional deficits in the context of the above medical diagnosis(es)? Yes Addressing deficits in the following areas: balance, endurance, locomotion, strength, transferring, bowel/bladder control, bathing, dressing, feeding, grooming, toileting, cognition, and psychosocial support Can the patient actively participate in an intensive therapy program of at least 3 hrs of therapy 5 days a week? Yes The potential for patient to make measurable gains while on inpatient rehab is excellent Anticipated functional outcomes upon discharge from inpatient rehab:  supervision PT, supervision OT, supervision SLP Estimated rehab length of stay to reach the above functional goals is: 12-14 days Anticipated discharge destination: Home 10. Overall Rehab/Functional Prognosis: excellent     MD Signature: Meredith Staggers, MD, [place] Director Rehabilitation Services 10/01/2021

## 2021-10-01 NOTE — H&P (Signed)
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 This was associated with dizziness, generalized weakness and nausea. EMS was called and the patient was able to ambulate to the stretcher. There was report of recent alcohol intake. BAC level negative. UDS negative. CTH negative for acute hemorrhage. Physical exam significant for left upper and lower extremity ataxia. MRI positive for acute infarcts involving the left cerebellum extending into the dorsal and left midbrain; two small foci of acute infarction in the right cerebellum. Neurology consulted. No tPA as outside window. No LVO. Etiology likely atheroembolic versus cardioembolic. TEE done 09/21/2021. Aspirin 81 mg and Plavix 75 mg daily for 90 days followed by aspirin 81 mg daily. Recommend 30 day event monitor at discharge. 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    He is alert and oriented to person, place, month; slow to recall president's name. States he lives with his wife, Oleta Mouse. Neither one of them drive and states his wife's sister drives for them. He says he drinks vodka with water, but will not quantify his drinking stating he quit when he "got sick", referring to this hospitalization. He states he quit smoking years ago. Denies pain. Eating ok and had a bowel movement this morning stating some pudding "tore my stomach up". States his swallowing has improved since admission.    PCP: Dr. Cindie Laroche, [facility] Review of Systems  Constitutional:  Negative for chills and fever.  HENT:   Negative for hearing loss.   Eyes:  Negative for blurred vision and double vision.  Respiratory:  Negative for cough and hemoptysis.   Cardiovascular:  Negative for chest pain and palpitations.  Gastrointestinal:  Positive for abdominal pain.  Genitourinary:  Negative for dysuria.  Musculoskeletal:  Negative for neck pain.  Neurological:  Positive for weakness. Negative for headaches.  Psychiatric/Behavioral:  Negative for depression and suicidal ideas.       Past Medical History:  Diagnosis Date   Cataract     HLD (hyperlipidemia)     Hypertension           Past Surgical History:  Procedure Laterality Date   COLONOSCOPY   11/19/2009    RMR: 1. Multiple small rectal polyps status post snare polypectomy. 2. Multiple left colon polyp status post snare removal . Poor prep compromised exam.    COLONOSCOPY N/A 12/13/2014    Procedure: COLONOSCOPY;  Surgeon: Daneil Dolin, MD;  [facility];  Service: Endoscopy;  Laterality: N/A;  945   COLONOSCOPY N/A 07/18/2020    Procedure: COLONOSCOPY;  Surgeon: Daneil Dolin, MD;  Location: [facility];  Service: Endoscopy;  Laterality: N/A;  am   FLEXIBLE SIGMOIDOSCOPY N/A 04/25/2020    Procedure: FLEXIBLE SIGMOIDOSCOPY;  Surgeon: Daneil Dolin, MD; incomplete colonoscopy with flex sig only due to poor prep.   HERNIA REPAIR       POLYPECTOMY   07/18/2020    Procedure: POLYPECTOMY;  Surgeon: Daneil Dolin, MD;  Location: [facility];  Service: Endoscopy;;         Family History  Problem Relation Age of  Onset   Colon cancer Neg Hx     Stroke Mother     Heart attack Father      Social History:  reports that he has quit smoking. He has never used smokeless tobacco. He reports current alcohol use. He reports that he does not use drugs. Allergies: No Known Allergies       Medications Prior to Admission  Medication Sig Dispense Refill   rosuvastatin (CRESTOR) 20 MG tablet Take 20 mg by mouth daily.        amLODipine (NORVASC) 5  MG tablet Take 5 mg by mouth daily.        cloNIDine (CATAPRES) 0.1 MG tablet Take by mouth daily.        ezetimibe (ZETIA) 10 MG tablet Take by mouth daily.        metoprolol succinate (TOPROL-XL) 50 MG 24 hr tablet Take 50 mg by mouth daily. Take with or immediately following a meal.        naproxen (NAPROSYN) 500 MG tablet Take 500 mg by mouth 2 (two) times daily with a meal.        olmesartan (BENICAR) 20 MG tablet Take 20 mg by mouth daily.        polyethylene glycol-electrolytes (TRILYTE) 420 g solution Take 4,000 mLs by mouth as directed. 4000 mL 0   Munday 420 MG/3.5ML SOCT SMARTSIG:420 Milligram(s) SUB-Q Once a Month              Home: Home Living Family/patient expects to be discharged to:: Private residence Living Arrangements: Spouse/significant other Available Help at Discharge: Family, Available 24 hours/day Type of Home: House Home Access: Stairs to enter Technical brewer of Steps: 2 Entrance Stairs-Rails: None Home Layout: One level Bathroom Shower/Tub: Chiropodist: Handicapped height Bathroom Accessibility: Yes Home Equipment: Conservation officer, nature (2 wheels) Additional Comments: taken via PT note  Lives With: Spouse   Functional History: Prior Function Prior Level of Function : Needs assist Physical Assist : ADLs (physical) ADLs (physical): IADLs Mobility Comments: Patient reports ambulating independently at home and in community with RW prn but was not used. Patient was not driving previously. (via PT note) ADLs Comments: Pt reports independence with ADL's with family assisting IADL's.   Functional Status:  Mobility: Bed Mobility Overal bed mobility: Modified Independent General bed mobility comments: Patient is still modified independent with supien to sit but requires extended time Transfers Overall transfer level: Needs assistance Equipment used: Rolling walker (2 wheels) Transfers: Sit to/from Stand, Bed to  chair/wheelchair/BSC Sit to Stand: Min assist, Mod assist Bed to/from chair/wheelchair/BSC transfer type:: Step pivot Step pivot transfers: Mod assist General transfer comment: Patient has improved with transfer ability with requiring min/mod assist but still presents with significant generalized weakness and impaired coordination impacting ability to transfer. Ambulation/Gait Ambulation/Gait assistance: Mod assist Gait Distance (Feet): 60 Feet Assistive device: Rolling walker (2 wheels) Gait Pattern/deviations: Step-to pattern, Decreased step length - left, Decreased step length - right, Decreased stride length, Decreased weight shift to right, Wide base of support, Ataxic General Gait Details: Patient still presents with labored cadence with severe ataxia but has demonstrated minor improvements with gait speed, but needs cueing with RW for safety. Patient was able to ambualte without a break during session but was still limited by fatigue. Gait pattern breakdown observed as patient became more fatigued with patient struggling with balance and coordination toward the end of gait. Gait velocity: decreased   ADL: ADL Overall ADL's : Needs assistance/impaired  Eating/Feeding: Set up, Sitting, Minimal assistance Eating/Feeding Details (indicate cue type and reason): Assist to open milk, fruit cup, and apple juice. Spilling coffee on self and tray due to ataxic movement. Grooming: Minimal assistance, Sitting Grooming Details (indicate cue type and reason): washed hands with min assist while standing at the sink, washed face with set up sitting in chair. Upper Body Bathing: Minimal assistance, Sitting Lower Body Bathing: Min guard, Minimal assistance, Sitting/lateral leans Upper Body Dressing : Supervision/safety, Min guard, Sitting Lower Body Dressing: Supervision/safety, Min guard, Sitting/lateral leans Lower Body Dressing Details (indicate cue type and reason): able to doff and done L sock  seated at EOB with labored effort and extended time Toilet Transfer: Moderate assistance, Ambulation Toilet Transfer Details (indicate cue type and reason): Completed toilet t/f with RW and mod assist Toileting- Clothing Manipulation and Hygiene: Moderate assistance Toileting - Clothing Manipulation Details (indicate cue type and reason): required mod assist for posterior toilet hygiene Tub/ Shower Transfer: Minimal assistance, Tub bench, Stand-pivot Functional mobility during ADLs: Moderate assistance, Rolling walker (2 wheels) General ADL Comments: Pt able to ambulate in hall but with moderate assist due to ataxic movements and poor balance overall.   Cognition: Cognition Overall Cognitive Status: Impaired/Different from baseline Arousal/Alertness: Awake/alert Orientation Level: Oriented X4 Year: 2023 Month: May Attention: Sustained Sustained Attention: Impaired Sustained Attention Impairment: Verbal basic, Functional basic Memory: Impaired Memory Impairment: Retrieval deficit, Decreased short term memory, Decreased recall of new information Decreased Short Term Memory: Verbal basic, Functional basic Awareness: Impaired Awareness Impairment: Anticipatory impairment Problem Solving: Impaired Problem Solving Impairment: Verbal basic, Functional basic Behaviors: Restless, Impulsive, Perseveration Safety/Judgment: Impaired Cognition Arousal/Alertness: Awake/alert Behavior During Therapy: WFL for tasks assessed/performed Overall Cognitive Status: Impaired/Different from baseline   Physical Exam: Blood pressure (!) 190/88, pulse 75, temperature 98.5 F (36.9 C), temperature source Oral, resp. rate 19, height '5\' 11"'$  (1.803 m), weight 69.9 kg, SpO2 96 %. Physical Exam Constitutional:      General: He is not in acute distress. HENT:     Head: Normocephalic.     Right Ear: External ear normal.     Left Ear: External ear normal.     Nose: Nose normal.     Mouth/Throat:     Mouth:  Mucous membranes are moist.  Eyes:     Pupils: Pupils are equal, round, and reactive to light.  Cardiovascular:     Rate and Rhythm: Normal rate and regular rhythm.  Pulmonary:     Effort: Pulmonary effort is normal. No respiratory distress.     Breath sounds: No wheezing.  Abdominal:     General: Bowel sounds are normal. There is no distension.     Palpations: Abdomen is soft.     Tenderness: There is no abdominal tenderness.  Skin:    General: Skin is warm and dry.  Neurological:     Mental Status: He is alert and oriented to person, place, and time.     Comments: Pt is alert and oriented to person, place. Fair insight and awareness. Mild left sided limb ataxia and dysmetria. Strength is 4+ to 5/5 in all 4's. No sensory findings.  No abnormal tone.    Psychiatric:     Comments: Generally pleasant and cooperative      Lab Results Last 48 Hours        Results for orders placed or performed during the hospital encounter of 09/25/21 (from the past 48 hour(s))  CBC     Status: Abnormal    Collection  Time: 10/01/21  4:37 AM  Result Value Ref Range    WBC 5.3 4.0 - 10.5 K/uL    RBC 3.47 (L) 4.22 - 5.81 MIL/uL    Hemoglobin 10.8 (L) 13.0 - 17.0 g/dL    HCT 32.9 (L) 39.0 - 52.0 %    MCV 94.8 80.0 - 100.0 fL    MCH 31.1 26.0 - 34.0 pg    MCHC 32.8 30.0 - 36.0 g/dL    RDW 13.2 11.5 - 15.5 %    Platelets 220 150 - 400 K/uL    nRBC 0.0 0.0 - 0.2 %      Comment: Performed at [facility], [address] [place], [place] [address]  Basic metabolic panel     Status: Abnormal    Collection Time: 10/01/21  4:37 AM  Result Value Ref Range    Sodium 137 135 - 145 mmol/L    Potassium 3.2 (L) 3.5 - 5.1 mmol/L    Chloride 103 98 - 111 mmol/L    CO2 27 22 - 32 mmol/L    Glucose, Bld 101 (H) 70 - 99 mg/dL      Comment: Glucose reference range applies only to samples taken after fasting for at least 8 hours.    BUN 11 8 - 23 mg/dL    Creatinine, Ser 0.73 0.61 - 1.24 mg/dL    Calcium 8.2 (L)  8.9 - 10.3 mg/dL    GFR, Estimated >60 >60 mL/min      Comment: (NOTE) Calculated using the CKD-EPI Creatinine Equation (2021)      Anion gap 7 5 - 15      Comment: Performed at [facility], [address] [place], [place] [address]      Imaging Results (Last 48 hours)  No results found.         Blood pressure (!) 190/88, pulse 75, temperature 98.5 F (36.9 C), temperature source Oral, resp. rate 19, height '5\' 11"'$  (1.803 m), weight 69.9 kg, SpO2 96 %.   Medical Problem List and Plan: 1. 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 Left cerebellar infarct extends into dorsal left midbrain.             -patient may  shower             -ELOS/Goals: 12-14 days, supervision goals with PT, OT, SLP 2.  Antithrombotics: -DVT/anticoagulation:  Pharmaceutical: Lovenox             -antiplatelet therapy: Plavix and aspirin for 3 months then aspirin alone 3. Pain Management: Tylenol as needed 4. Mood: LCSW to evaluate and provide emotional support             -antipsychotic agents: n/a 5. Neuropsych: This patient is capable of making decisions on his own behalf. 6. Skin/Wound Care: Routine skin care checks 7. Fluids/Electrolytes/Nutrition: Routine Is and Os and follow-up chemistries             -dys 3 diet with nectar; crush meds 8: Dysphagia; continue dysphagia 3 diet with nectar thick liquids             --SLP eval 9: Hypertension: continue metoprolol 50 mg daily             --home Catapres and Benicar held and not restarted 10: Hyperlipidemia: continue Crestor 20 mg daily             --home Zetia and Repatha held not restarted 11: Prediabetes: random serum glucoses and CBGs within range --  follow-up/monitor with PCP 12: GERD: continue Protonix 40 mg daily 13: Hypokalemia: recheck BMP tomorrow             -has received supplement 14: Anemia: not symptomatic; follow-up CBC 15. Episodes of SVT--responded to IV metoprolol x 1 dose             -f/u EKG  confirmed NSR             -continue metoprolol '50mg'$  daily as bove.         Barbie Banner, PA-C 10/01/2021   I have personally performed a face to face diagnostic evaluation of this patient and formulated the key components of the plan.  Additionally, I have personally reviewed laboratory data, imaging studies, as well as relevant notes and concur with the physician assistant's documentation above.  The patient's status has not changed from the original H&P.  Any changes in documentation from the acute care chart have been noted above.  Meredith Staggers, MD, Mellody Drown

## 2021-10-01 NOTE — Progress Notes (Signed)
   10/01/21 1011  Assess: MEWS Score  Temp 98 F (36.7 C)  BP 105/74  Pulse Rate (!) 146  Resp (!) 21  Level of Consciousness Alert  SpO2 97 %  O2 Device Room Air  Assess: MEWS Score  MEWS Temp 0  MEWS Systolic 0  MEWS Pulse 3  MEWS RR 1  MEWS LOC 0  MEWS Score 4  MEWS Score Color Red  Assess: if the MEWS score is Yellow or Red  Were vital signs taken at a resting state? Yes  Focused Assessment Change from prior assessment (see assessment flowsheet)  Early Detection of Sepsis Score *See Row Information* Low  MEWS guidelines implemented *See Row Information* Yes  Treat  MEWS Interventions Escalated (See documentation below)  Take Vital Signs  Increase Vital Sign Frequency  Red: Q 1hr X 4 then Q 4hr X 4, if remains red, continue Q 4hrs  Escalate  MEWS: Escalate Red: discuss with charge nurse/RN and provider, consider discussing with RRT  Notify: Charge Nurse/RN  Name of Charge Nurse/RN Notified Crystal, RN  Date Charge Nurse/RN Notified 10/01/21  Time Charge Nurse/RN Notified 1014  Notify: Provider  Provider Name/Title Dr. Dyann Kief  Date Provider Notified 10/01/21  Time Provider Notified 1014  Method of Notification Page  Notification Reason Change in status  Provider response  (awaiting new orders)

## 2021-10-01 NOTE — TOC Transition Note (Signed)
Transition of Care [facility]) - CM/SW Discharge Note   Patient Details  Name: Javier [name] MRN: [id number] Date of Birth: 11-26-1945  [facility] CM/SW Contact:  Boneta Lucks, RN Phone Number: 10/01/2021, 10:24 AM   Clinical Narrative:   CIR received Auth and has a bed. Med necessity completed for [facility], RN will call report. TOC updated patients wife with discharge plan.   Final next level of care: [place] Barriers to Discharge: Continued Medical Work up  Patient Goals and CMS Choice Patient states their goals for this hospitalization and ongoing recovery are:: to go home. CMS Medicare.gov Compare Post Acute Care list provided to:: Patient Choice offered to / list presented to : Patient  Discharge Placement    Patient and family notified of of transfer: 09/26/21  Discharge Plan and Services               DME Arranged: Gilford Rile rolling, 3-N-1 DME Agency: [employer] Date DME Agency Contacted: 09/26/21 Time DME Agency Contacted: [id number] Representative spoke with at DME Agency: Caryl Pina HH Arranged: PT [place]: [place] Date [place]: 09/26/21 Time [place]: 1253 Representative spoke with at [place]: Georgina Snell   Readmission Risk Interventions     View : No data to display.

## 2021-10-01 NOTE — Progress Notes (Signed)
Progress Note   Patient: Javier [name] NIO:[id number] DOB: July 07, 1945 DOA: 09/25/2021     0  DOS: the patient was seen and examined on 10/01/2021   Brief hospital course: Javier [name] is a 76 y.o. male with medical history significant of hypertension, hyperlipidemia and gastroesophageal reflux disease who presented to the hospital secondary to dizziness and poor balance.  Patient reports symptom has been present for the last 24-48 hours and that he woke up feeling that way when the symptoms started.  He thought that they would go away but unfortunately, that has not been the case; he has remained dizzy when changing positions and expressed unbalance gait.  Patient denies chest pain, shortness of breath, fever, coughing spells, dysuria, hematuria, diarrhea, melena, hematochezia, blurry vision or any other complaints. Work-up in the ED has demonstrated no acute hemorrhagic changes on CT scan; positive MRI for cerebellar infarcts that would explain patient's symptoms and presentation.  Discussed with neurology service who has recommended admission to the hospital to expedite stroke work-up.    Assessment and Plan: * Stroke (cerebrum) ([place]) -Affecting cerebellar area -No acute abnormality has been seen on telemetry -Neurology recommended 30-day event monitoring as an outpatient.  -LDL 108, triglycerides 90, total cholesterol 172 and HDL 46.  Patient will be continue on a statin.   -A1c 5.7; lifestyle modifications and carbohydrate diet discussed with patient and he is meeting criteria for prediabetes.   -2D echo and carotid Dopplers stable and not demonstrating acute source for patient stroke. -After discussing with neurology plan will be for 67-monthdual antiplatelet therapy using aspirin and Plavix with subsequent use of daily aspirin for prevention. -Continue risk factor modifications.   Gastroesophageal reflux disease -Continue PPI.  Hyperlipidemia -Continue the use of  Crestor.  Essential hypertension - Resume home antihypertensive agents -Close monitoring of patient blood pressure with further adjustment to his medications as needed.  Isolated SVT episode: -Resolved with one-time dose of IV metoprolol through his veins. -No chest pain, no shortness of breath, no nausea, no vomiting. -Electrolytes demonstrated mild hypokalemia and was adequately repleted. -Safe for patient to be discharged to CIR for further care and rehabilitation.  Hypokalemia -Potassium 3.2 -Orally repleted -Follow basic metabolic to assess electrolytes stability.  Subjective:  No overnight events; patient with transient episodes of SVT this morning; successfully aborted with IV metoprolol x1 dose.  Patient denies shortness of breath and chest pain.  Maintain in monitoring process for 3 more hours with stable vital signs appreciated.  Sinus rhythm currently rate control and no acute complaints.  Physical Exam: Vitals:   10/01/21 1110 10/01/21 1115 10/01/21 1230 10/01/21 1304  BP: 102/79 138/78 138/86 130/67  Pulse: (!) 125 72 80 61  Resp: '20 20 20 20  '$ Temp:    98.7 F (37.1 C)  TempSrc:    Oral  SpO2: 97% 100% 97% 100%  Weight:      Height:       General exam: Alert, awake, oriented x 3; able to follow commands appropriately and complaining of epigastric discomfort and ongoing dysmetria/poor balance.  No fever, no chest pain, no nausea, no vomiting, no shortness of breath. Respiratory system: Clear to auscultation. Respiratory effort normal.  Good saturation on room air. Cardiovascular system: Sinus tachycardia, no rubs, no gallops, no murmurs on exam.  No JVD appreciated. Gastrointestinal system: Abdomen is nondistended, soft and nontender. No organomegaly or masses felt. Normal bowel sounds heard. Central nervous system: Alert and oriented. No focal muscular deficits (muscle strength  5 out of 5 bilaterally ); Still with ongoing dysmetria and expressing poor  balance. Extremities: No cyanosis or clubbing; no edema. Skin: No petechiae. Psychiatry: Judgement and insight appear normal. Mood & affect appropriate.   Data Reviewed: CBC: WBC 5.3, hemoglobin 10.8 and platelet count 955O Basic metabolic panel: Demonstrating a sodium of 137, potassium 3.2, BUN 11 and creatinine 0.73.  Family Communication: Wife at bedside.  Disposition: Status is: Observation  Planned Discharge Destination: To be determined; recommendation for CIR.  Currently barriers: insurance authorization.  Patient has been medically stable since 09/26/2021.   Author: Barton Dubois, MD 10/01/2021 1:31 PM  For on call review [email or web address].

## 2021-10-01 NOTE — Progress Notes (Signed)
Inpatient Rehab Admissions Coordinator:   Pt. To transfer to CIR today. RN may call report to [place], [place], [place] Admissions Coordinator  [phone] ([place]) [phone] (office)

## 2021-10-01 NOTE — Progress Notes (Signed)
Speech Language Pathology Treatment: Dysphagia  Patient Details Name: Javier [name] MRN: [id number] DOB: 03/25/1946 Today's Date: 10/01/2021 Time: 1030-1046 SLP Time Calculation (min) (ACUTE ONLY): 16 min  Assessment / Plan / Recommendation Clinical Impression  Ongoing diagnostic dysphagia therapy was provided while Pt was sitting upright in chair. Pt consumed regular trials demonstrating significant impulsivity taking large bites. Pt consumed trials of thin liquids with consistent coughing after sips. Secondary to ongoing impulsivity, difficulty regulating safe portions/bites/sips recommend continue with D3/mech soft diet and NTL. Recommend ongoing ST while in acute and at d/c location for dysphagia and cognition. ST will continue to follow.   HPI HPI: This is a 76 year old male who presents by EMS with concerns for dizziness.  Patient states that he woke up this morning and felt weak and dizzy.  He has been nauseated.  He denies room spinning dizziness.  Patient reports that he drinks daily.  When asked, she drank last night he cannot quantify.  Patient states that every time he gets up "I just have to fall back down."  EMS noted that he was able to ambulate to the stretcher.  He denies any abdominal pain but has had 1 episode of nonbilious, nonbloody emesis.  No recent illnesses or fevers. 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 Two small foci of acute infarction in the  right cerebellum. Patent vertebrobasilar arteries. 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 BSE completed on 09/26/21 initiating Dysphagia 3/thin w/ precautions; MBS completed and recommended continue current diet with minced meats and if impulsivity continues, downgrade diet for safety; f/u diet check/education with pt.      SLP Plan  Continue with current plan of care      Recommendations for follow up therapy are one component of a multi-disciplinary discharge planning process,  led by the attending physician.  Recommendations may be updated based on patient status, additional functional criteria and insurance authorization.    Recommendations  Diet recommendations: Dysphagia 3 (mechanical soft);Nectar-thick liquid Liquids provided via: Cup;No straw Medication Administration: Whole meds with puree Supervision: Patient able to self feed;Full supervision/cueing for compensatory strategies;Staff to assist with self feeding Compensations: Slow rate;Small sips/bites;Minimize environmental distractions;Multiple dry swallows after each bite/sip;Clear throat intermittently Postural Changes and/or Swallow Maneuvers: Seated upright 90 degrees                Oral Care Recommendations: Oral care BID Follow Up Recommendations: Acute inpatient rehab (3hours/day) Assistance recommended at discharge: Frequent or constant Supervision/Assistance SLP Visit Diagnosis: Dysphagia, oropharyngeal phase (R13.12) 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 Roddie Mc, CCC-SLP Speech Language Pathologist  Wende Bushy  10/01/2021, 11:25 AM

## 2021-10-01 NOTE — Progress Notes (Signed)
Patients HR up into the 140;s while working with therapy, went up to 186 at one time, sat patient back down and obtained resting VS on him, this turned his MEWS to a red. Notified Dr. Dyann Kief, and Charge Nurse Crystal , RN , angel RN notified as well   Patient is alert and responsive, his BP is stable his HR is elevated     10/01/21 1011  Assess: MEWS Score  Temp 98 F (36.7 C)  BP 105/74  Pulse Rate (!) 146  Resp (!) 21  Level of Consciousness Alert  SpO2 97 %  O2 Device Room Air  Assess: MEWS Score  MEWS Temp 0  MEWS Systolic 0  MEWS Pulse 3  MEWS RR 1  MEWS LOC 0  MEWS Score 4  MEWS Score Color Red

## 2021-10-01 NOTE — H&P (Signed)
Physical Medicine and Rehabilitation Admission H&P    CC:  functional deficits secondary to cerebellar infarcts  HPI: Javier [name] is a 76 year old male who presented to the emergency department complaining of acute onset of imbalance while trying to stand on the morning of 09/25/2021. This was associated with dizziness, generalized weakness and nausea. EMS was called and the patient was able to ambulate to the stretcher. There was report of recent alcohol intake. BAC level negative. UDS negative. CTH negative for acute hemorrhage. Physical exam significant for left upper and lower extremity ataxia. MRI positive for acute infarcts involving the left cerebellum extending into the dorsal and left midbrain; two small foci of acute infarction in the right cerebellum. Neurology consulted. No tPA as outside window. No LVO. Etiology likely atheroembolic versus cardioembolic. TEE done 09/21/2021. Aspirin 81 mg and Plavix 75 mg daily for 90 days followed by aspirin 81 mg daily. Recommend 30 day event monitor at discharge. 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   He is alert and oriented to person, place, month; slow to recall president's name. States he lives with his wife, Oleta Mouse. Neither one of them drive and states his wife's sister drives for them. He says he drinks vodka with water, but will not quantify his drinking stating he quit when he "got sick", referring to this hospitalization. He states he quit smoking years ago. Denies pain. Eating ok and had a bowel movement this morning stating some pudding "tore my stomach up". States his swallowing has improved since admission.   PCP: Dr. Cindie Laroche, [facility] Review of Systems  Constitutional:  Negative for chills and fever.  HENT:  Negative  for hearing loss.   Eyes:  Negative for blurred vision and double vision.  Respiratory:  Negative for cough and hemoptysis.   Cardiovascular:  Negative for chest pain and palpitations.  Gastrointestinal:  Positive for abdominal pain.  Genitourinary:  Negative for dysuria.  Musculoskeletal:  Negative for neck pain.  Neurological:  Positive for weakness. Negative for headaches.  Psychiatric/Behavioral:  Negative for depression and suicidal ideas.   Past Medical History:  Diagnosis Date   Cataract    HLD (hyperlipidemia)    Hypertension    Past Surgical History:  Procedure Laterality Date   COLONOSCOPY  11/19/2009   RMR: 1. Multiple small rectal polyps status post snare polypectomy. 2. Multiple left colon polyp status post snare removal . Poor prep compromised exam.    COLONOSCOPY N/A 12/13/2014   Procedure: COLONOSCOPY;  Surgeon: Daneil Dolin, MD;  Location: [facility];  Service: Endoscopy;  Laterality: N/A;  945   COLONOSCOPY N/A 07/18/2020   Procedure: COLONOSCOPY;  Surgeon: Daneil Dolin, MD;  Location: [facility];  Service: Endoscopy;  Laterality: N/A;  am   FLEXIBLE SIGMOIDOSCOPY N/A 04/25/2020   Procedure: FLEXIBLE SIGMOIDOSCOPY;  Surgeon: Daneil Dolin, MD; incomplete colonoscopy with flex sig only due to poor prep.   HERNIA REPAIR     POLYPECTOMY  07/18/2020   Procedure: POLYPECTOMY;  Surgeon: Daneil Dolin, MD;  Location: [facility];  Service: Endoscopy;;   Family History  Problem Relation Age of Onset   Colon cancer Neg Hx    Stroke Mother    Heart attack Father    Social History:  reports that he has quit smoking. He has never  used smokeless tobacco. He reports current alcohol use. He reports that he does not use drugs. Allergies: No Known Allergies Medications Prior to Admission  Medication Sig Dispense Refill   rosuvastatin (CRESTOR) 20 MG tablet Take 20 mg by mouth daily.      amLODipine (NORVASC) 5 MG tablet Take 5 mg by mouth daily.      cloNIDine  (CATAPRES) 0.1 MG tablet Take by mouth daily.      ezetimibe (ZETIA) 10 MG tablet Take by mouth daily.      metoprolol succinate (TOPROL-XL) 50 MG 24 hr tablet Take 50 mg by mouth daily. Take with or immediately following a meal.      naproxen (NAPROSYN) 500 MG tablet Take 500 mg by mouth 2 (two) times daily with a meal.      olmesartan (BENICAR) 20 MG tablet Take 20 mg by mouth daily.      polyethylene glycol-electrolytes (TRILYTE) 420 g solution Take 4,000 mLs by mouth as directed. 4000 mL 0   Cantril 420 MG/3.5ML SOCT SMARTSIG:420 Milligram(s) SUB-Q Once a Month        Home: Home Living Family/patient expects to be discharged to:: Private residence Living Arrangements: Spouse/significant other Available Help at Discharge: Family, Available 24 hours/day Type of Home: House Home Access: Stairs to enter Technical brewer of Steps: 2 Entrance Stairs-Rails: None Home Layout: One level Bathroom Shower/Tub: Chiropodist: Handicapped height Bathroom Accessibility: Yes Home Equipment: Conservation officer, nature (2 wheels) Additional Comments: taken via PT note  Lives With: Spouse   Functional History: Prior Function Prior Level of Function : Needs assist Physical Assist : ADLs (physical) ADLs (physical): IADLs Mobility Comments: Patient reports ambulating independently at home and in community with RW prn but was not used. Patient was not driving previously. (via PT note) ADLs Comments: Pt reports independence with ADL's with family assisting IADL's.  Functional Status:  Mobility: Bed Mobility Overal bed mobility: Modified Independent General bed mobility comments: Patient is still modified independent with supien to sit but requires extended time Transfers Overall transfer level: Needs assistance Equipment used: Rolling walker (2 wheels) Transfers: Sit to/from Stand, Bed to chair/wheelchair/BSC Sit to Stand: Min assist, Mod assist Bed to/from  chair/wheelchair/BSC transfer type:: Step pivot Step pivot transfers: Mod assist General transfer comment: Patient has improved with transfer ability with requiring min/mod assist but still presents with significant generalized weakness and impaired coordination impacting ability to transfer. Ambulation/Gait Ambulation/Gait assistance: Mod assist Gait Distance (Feet): 60 Feet Assistive device: Rolling walker (2 wheels) Gait Pattern/deviations: Step-to pattern, Decreased step length - left, Decreased step length - right, Decreased stride length, Decreased weight shift to right, Wide base of support, Ataxic General Gait Details: Patient still presents with labored cadence with severe ataxia but has demonstrated minor improvements with gait speed, but needs cueing with RW for safety. Patient was able to ambualte without a break during session but was still limited by fatigue. Gait pattern breakdown observed as patient became more fatigued with patient struggling with balance and coordination toward the end of gait. Gait velocity: decreased    ADL: ADL Overall ADL's : Needs assistance/impaired Eating/Feeding: Set up, Sitting, Minimal assistance Eating/Feeding Details (indicate cue type and reason): Assist to open milk, fruit cup, and apple juice. Spilling coffee on self and tray due to ataxic movement. Grooming: Minimal assistance, Sitting Grooming Details (indicate cue type and reason): washed hands with min assist while standing at the sink, washed face with set up sitting in chair. Upper Body  Bathing: Minimal assistance, Sitting Lower Body Bathing: Min guard, Minimal assistance, Sitting/lateral leans Upper Body Dressing : Supervision/safety, Min guard, Sitting Lower Body Dressing: Supervision/safety, Min guard, Sitting/lateral leans Lower Body Dressing Details (indicate cue type and reason): able to doff and done L sock seated at EOB with labored effort and extended time Toilet Transfer:  Moderate assistance, Ambulation Toilet Transfer Details (indicate cue type and reason): Completed toilet t/f with RW and mod assist Toileting- Clothing Manipulation and Hygiene: Moderate assistance Toileting - Clothing Manipulation Details (indicate cue type and reason): required mod assist for posterior toilet hygiene Tub/ Shower Transfer: Minimal assistance, Tub bench, Stand-pivot Functional mobility during ADLs: Moderate assistance, Rolling walker (2 wheels) General ADL Comments: Pt able to ambulate in hall but with moderate assist due to ataxic movements and poor balance overall.  Cognition: Cognition Overall Cognitive Status: Impaired/Different from baseline Arousal/Alertness: Awake/alert Orientation Level: Oriented X4 Year: 2023 Month: May Attention: Sustained Sustained Attention: Impaired Sustained Attention Impairment: Verbal basic, Functional basic Memory: Impaired Memory Impairment: Retrieval deficit, Decreased short term memory, Decreased recall of new information Decreased Short Term Memory: Verbal basic, Functional basic Awareness: Impaired Awareness Impairment: Anticipatory impairment Problem Solving: Impaired Problem Solving Impairment: Verbal basic, Functional basic Behaviors: Restless, Impulsive, Perseveration Safety/Judgment: Impaired Cognition Arousal/Alertness: Awake/alert Behavior During Therapy: WFL for tasks assessed/performed Overall Cognitive Status: Impaired/Different from baseline  Physical Exam: Blood pressure (!) 190/88, pulse 75, temperature 98.5 F (36.9 C), temperature source Oral, resp. rate 19, height '5\' 11"'$  (1.803 m), weight 69.9 kg, SpO2 96 %. Physical Exam Constitutional:      General: He is not in acute distress. HENT:     Head: Normocephalic.     Right Ear: External ear normal.     Left Ear: External ear normal.     Nose: Nose normal.     Mouth/Throat:     Mouth: Mucous membranes are moist.  Eyes:     Pupils: Pupils are equal,  round, and reactive to light.  Cardiovascular:     Rate and Rhythm: Normal rate and regular rhythm.  Pulmonary:     Effort: Pulmonary effort is normal. No respiratory distress.     Breath sounds: No wheezing.  Abdominal:     General: Bowel sounds are normal. There is no distension.     Palpations: Abdomen is soft.     Tenderness: There is no abdominal tenderness.  Skin:    General: Skin is warm and dry.  Neurological:     Mental Status: He is alert and oriented to person, place, and time.     Comments: Pt is alert and oriented to person, place. Fair insight and awareness. Mild left sided limb ataxia and dysmetria. Strength is 4+ to 5/5 in all 4's. No sensory findings.  No abnormal tone.    Psychiatric:     Comments: Generally pleasant and cooperative    Results for orders placed or performed during the hospital encounter of 09/25/21 (from the past 48 hour(s))  CBC     Status: Abnormal   Collection Time: 10/01/21  4:37 AM  Result Value Ref Range   WBC 5.3 4.0 - 10.5 K/uL   RBC 3.47 (L) 4.22 - 5.81 MIL/uL   Hemoglobin 10.8 (L) 13.0 - 17.0 g/dL   HCT 32.9 (L) 39.0 - 52.0 %   MCV 94.8 80.0 - 100.0 fL   MCH 31.1 26.0 - 34.0 pg   MCHC 32.8 30.0 - 36.0 g/dL   RDW 13.2 11.5 - 15.5 %   Platelets  220 150 - 400 K/uL   nRBC 0.0 0.0 - 0.2 %    Comment: Performed at [facility], [address], [place], [place] [address]  Basic metabolic panel     Status: Abnormal   Collection Time: 10/01/21  4:37 AM  Result Value Ref Range   Sodium 137 135 - 145 mmol/L   Potassium 3.2 (L) 3.5 - 5.1 mmol/L   Chloride 103 98 - 111 mmol/L   CO2 27 22 - 32 mmol/L   Glucose, Bld 101 (H) 70 - 99 mg/dL    Comment: Glucose reference range applies only to samples taken after fasting for at least 8 hours.   BUN 11 8 - 23 mg/dL   Creatinine, Ser 0.73 0.61 - 1.24 mg/dL   Calcium 8.2 (L) 8.9 - 10.3 mg/dL   GFR, Estimated >60 >60 mL/min    Comment: (NOTE) Calculated using the CKD-EPI Creatinine Equation  (2021)    Anion gap 7 5 - 15    Comment: Performed at [facility], [address], [place], [place] [address]   No results found.    Blood pressure (!) 190/88, pulse 75, temperature 98.5 F (36.9 C), temperature source Oral, resp. rate 19, height '5\' 11"'$  (1.803 m), weight 69.9 kg, SpO2 96 %.  Medical Problem List and Plan: 1. 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  Antithrombotics: -DVT/anticoagulation:  Pharmaceutical: Lovenox  -antiplatelet therapy: Plavix and aspirin for 3 months then aspirin alone 3. Pain Management: Tylenol as needed 4. Mood: LCSW to evaluate and provide emotional support  -antipsychotic agents: n/a 5. Neuropsych: This patient is capable of making decisions on his own behalf. 6. Skin/Wound Care: Routine skin care checks 7. Fluids/Electrolytes/Nutrition: Routine Is and Os and follow-up chemistries  -dys 3 diet with nectar; crush meds 8: Dysphagia; continue dysphagia 3 diet with nectar thick liquids  --SLP eval 9: Hypertension: continue metoprolol 50 mg daily  --home Catapres and Benicar held and not restarted 10: Hyperlipidemia: continue Crestor 20 mg daily  --home Zetia and Repatha held not restarted 11: Prediabetes: random serum glucoses and CBGs within range --follow-up/monitor with PCP 12: GERD: continue Protonix 40 mg daily 13: Hypokalemia: recheck BMP tomorrow  -has received supplement 14: Anemia: not symptomatic; follow-up CBC 15. Episodes of SVT--responded to IV metoprolol x 1 dose  -f/u EKG confirmed NSR  -continue metoprolol '50mg'$  daily as bove.      Barbie Banner, PA-C 10/01/2021

## 2021-10-01 NOTE — Progress Notes (Signed)
PMR Admission Coordinator Pre-Admission Assessment   Patient: Javier [name] is an 75 y.o., male MRN: [id number] DOB: 10/02/1945 Height: 5' 11" (180.3 cm) Weight: 69.9 kg   Insurance Information HMO:     PPO:      PCP:      IPA:      80/20:      OTHER:  PRIMARY: UHC Medicare       Policy#: [id number]      Subscriber: pt CM Name:       Phone#: [phone]     Fax#: [phone]  Pre-Cert#: [id number]       Employer:  [employer] at [employer] called with authorization on 10/01/21. She stated Pt. Approved for admit 5/29-6/4 Benefits:  Phone #:      Name:  Eff Date: 09/02/2021 - still active Deductible: does not have one OOP Max: $8,300 ($0 met) CIR: $1,556/ admission copay SNF: $0.00 Copayment per day for days 1-20; $200 Copayment per day for days 21-100; Maximum of 100 days/benefit period Outpatient: 80% coverage; 20% co-insurance Home Health:  100% coverage, limited by medical necessity DME: 80% coverage; 20% co-insurance Providers: in network   Secondary: Medicaid of   [id number]   Financial Counselor:      Phone#:    The "Data Collection Information Summary" for patients in Inpatient Rehabilitation Facilities with attached "Privacy Act Statement-Health Care Records" was provided and verbally reviewed with: Patient   Emergency Contact Information Contact Information       Name Relation Home Work Mobile    Poplar,Viola Spouse [phone]   [phone]           Current Medical History  Patient Admitting Diagnosis: CVA History of Present Illness: Javier [name] is a 75 y.o. male with medical history significant of hypertension, hyperlipidemia and gastroesophageal reflux disease who presented to the [facility] ED secondary to dizziness and poor balance on 09/25/21. Work-up in the ED has demonstrated no acute hemorrhagic changes on CT scan; positive MRI for cerebellar infarcts Pt. Was seen by PT/OT/SLP during admission and they recommended CIR to assist return to PLOF     Complete NIHSS TOTAL: 2   Patient's medical record from [facility]  has been reviewed by the rehabilitation admission coordinator and physician.   Past Medical History      Past Medical History:  Diagnosis Date   Cataract     HLD (hyperlipidemia)     Hypertension        Has the patient had major surgery during 100 days prior to admission? No   Family History   family history includes Heart attack in his father; Stroke in his mother.   Current Medications   Current Facility-Administered Medications:    0.9 %  sodium chloride infusion, , Intravenous, Continuous, Madera, Carlos, MD, Last Rate: 50 mL/hr at 09/29/21 1704, New Bag at 09/29/21 1704   acetaminophen (TYLENOL) tablet 650 mg, 650 mg, Oral, Q4H PRN, 650 mg at 09/28/21 1044 **OR** acetaminophen (TYLENOL) 160 MG/5ML solution 650 mg, 650 mg, Per Tube, Q4H PRN **OR** acetaminophen (TYLENOL) suppository 650 mg, 650 mg, Rectal, Q4H PRN, Madera, Carlos, MD   aspirin chewable tablet 81 mg, 81 mg, Oral, Daily, Yadav, Priyanka O, MD, 81 mg at 09/30/21 1124   benzonatate (TESSALON) capsule 100 mg, 100 mg, Oral, Q8H PRN, Madera, Carlos, MD, 100 mg at 09/29/21 1549   clopidogrel (PLAVIX) tablet 75 mg, 75 mg, Oral, Daily, Madera, Carlos, MD, 75 mg at 09/30/21 1125   enoxaparin (LOVENOX) injection   40 mg, 40 mg, Subcutaneous, Q24H, Madera, Carlos, MD, 40 mg at 09/30/21 1125   metoprolol succinate (TOPROL-XL) 24 hr tablet 50 mg, 50 mg, Oral, Daily, Madera, Carlos, MD, 50 mg at 09/30/21 1125   pantoprazole (PROTONIX) EC tablet 40 mg, 40 mg, Oral, Daily, Madera, Carlos, MD, 40 mg at 09/30/21 1125   rosuvastatin (CRESTOR) tablet 20 mg, 20 mg, Oral, Daily, Madera, Carlos, MD, 20 mg at 09/30/21 1125   Patients Current Diet:  Diet Order                  DIET DYS 3 Room service appropriate? Yes; Fluid consistency: Nectar Thick  Diet effective now             Diet - low sodium heart healthy                         Precautions /  Restrictions Precautions Precautions: Fall Restrictions Weight Bearing Restrictions: No    Has the patient had 2 or more falls or a fall with injury in the past year? Yes   Prior Activity Level Limited Community (1-2x/wk): Pt. went out for appointments   Prior Functional Level Self Care: Did the patient need help bathing, dressing, using the toilet or eating? Independent   Indoor Mobility: Did the patient need assistance with walking from room to room (with or without device)? Independent   Stairs: Did the patient need assistance with internal or external stairs (with or without device)? Independent   Functional Cognition: Did the patient need help planning regular tasks such as shopping or remembering to take medications? Independent   Patient Information Are you of Hispanic, Latino/a,or Spanish origin?: A. No, not of Hispanic, Latino/a, or Spanish origin What is your race?: B. Black or African American Do you need or want an interpreter to communicate with a doctor or health care staff?: 0. No   Patient's Response To:  Health Literacy and Transportation Is the patient able to respond to health literacy and transportation needs?: Yes Health Literacy - How often do you need to have someone help you when you read instructions, pamphlets, or other written material from your doctor or pharmacy?: Never In the past 12 months, has lack of transportation kept you from medical appointments or from getting medications?: Yes In the past 12 months, has lack of transportation kept you from meetings, work, or from getting things needed for daily living?: No   Home Assistive Devices / Equipment Home Assistive Devices/Equipment: Dentures (specify type) (At home) Home Equipment: Rolling Walker (2 wheels)   Prior Device Use: Indicate devices/aids used by the patient prior to current illness, exacerbation or injury?  cane   Current Functional Level Cognition   Arousal/Alertness:  Awake/alert Overall Cognitive Status: Impaired/Different from baseline Orientation Level: Oriented to person, Oriented to place, Disoriented to time, Disoriented to situation Attention: Sustained Sustained Attention: Impaired Sustained Attention Impairment: Verbal basic, Functional basic Memory: Impaired Memory Impairment: Retrieval deficit, Decreased short term memory, Decreased recall of new information Decreased Short Term Memory: Verbal basic, Functional basic Awareness: Impaired Awareness Impairment: Anticipatory impairment Problem Solving: Impaired Problem Solving Impairment: Verbal basic, Functional basic Behaviors: Restless, Impulsive, Perseveration Safety/Judgment: Impaired    Extremity Assessment (includes Sensation/Coordination)   Upper Extremity Assessment: LUE deficits/detail, RUE deficits/detail RUE Deficits / Details: Ataxic with coordination deficits but 4+/5 grossly. RUE Sensation: WNL RUE Coordination: decreased gross motor, decreased fine motor LUE Deficits / Details: 3-/5 MMT shoulder flexion; 4 to 4+   grossly for elbow flexion, extension, wrist extension, and grip. LUE Sensation: decreased proprioception LUE Coordination: decreased fine motor, decreased gross motor  Lower Extremity Assessment: Defer to PT evaluation     ADLs   Overall ADL's : Needs assistance/impaired Eating/Feeding: Set up, Sitting, Minimal assistance Eating/Feeding Details (indicate cue type and reason): Assist to open milk, fruit cup, and apple juice. Spilling coffee on self and tray due to ataxic movement. Grooming: Minimal assistance, Sitting Grooming Details (indicate cue type and reason): washed hands with min assist while standing at the sink, washed face with set up sitting in chair. Upper Body Bathing: Minimal assistance, Sitting Lower Body Bathing: Min guard, Minimal assistance, Sitting/lateral leans Upper Body Dressing : Supervision/safety, Min guard, Sitting Lower Body Dressing:  Supervision/safety, Min guard, Sitting/lateral leans Lower Body Dressing Details (indicate cue type and reason): able to doff and done L sock seated at EOB with labored effort and extended time Toilet Transfer: Moderate assistance, Ambulation Toilet Transfer Details (indicate cue type and reason): Completed toilet t/f with RW and mod assist Toileting- Clothing Manipulation and Hygiene: Moderate assistance Toileting - Clothing Manipulation Details (indicate cue type and reason): required mod assist for posterior toilet hygiene Tub/ Shower Transfer: Minimal assistance, Tub bench, Stand-pivot Functional mobility during ADLs: Moderate assistance, Rolling walker (2 wheels) General ADL Comments: Pt able to ambulate in hall but with moderate assist due to ataxic movements and poor balance overall.     Mobility   Overal bed mobility: Modified Independent General bed mobility comments: Patient is still modified independent with supien to sit but requires extended time     Transfers   Overall transfer level: Needs assistance Equipment used: Rolling walker (2 wheels) Transfers: Sit to/from Stand, Bed to chair/wheelchair/BSC Sit to Stand: Min assist, Mod assist Bed to/from chair/wheelchair/BSC transfer type:: Step pivot Step pivot transfers: Mod assist General transfer comment: Patient has improved with transfer ability with requiring min/mod assist but still presents with significant generalized weakness and impaired coordination impacting ability to transfer.     Ambulation / Gait / Stairs / Wheelchair Mobility   Ambulation/Gait Ambulation/Gait assistance: Mod assist Gait Distance (Feet): 60 Feet Assistive device: Rolling walker (2 wheels) Gait Pattern/deviations: Step-to pattern, Decreased step length - left, Decreased step length - right, Decreased stride length, Decreased weight shift to right, Wide base of support, Ataxic General Gait Details: Patient still presents with labored cadence with  severe ataxia but has demonstrated minor improvements with gait speed, but needs cueing with RW for safety. Patient was able to ambualte without a break during session but was still limited by fatigue. Gait pattern breakdown observed as patient became more fatigued with patient struggling with balance and coordination toward the end of gait. Gait velocity: decreased     Posture / Balance Dynamic Sitting Balance Sitting balance - Comments: seated at EOB Balance Overall balance assessment: Needs assistance Sitting-balance support: Feet supported, Bilateral upper extremity supported Sitting balance-Leahy Scale: Fair Sitting balance - Comments: seated at EOB Postural control: Posterior lean Standing balance support: Bilateral upper extremity supported, During functional activity, Reliant on assistive device for balance Standing balance-Leahy Scale: Poor Standing balance comment: Patient reliant on RW for standing balance along with struggling with weight shifting and reaching outside BOS.     Special needs/care consideration Special service needs none    Previous Home Environment (from acute therapy documentation) Living Arrangements: Spouse/significant other  Lives With: Spouse Available Help at Discharge: Family, Available 24 hours/day Type of Home: House Home Layout: One level Home   Access: Stairs to enter Entrance Stairs-Rails: None Entrance Stairs-Number of Steps: 2 Bathroom Shower/Tub: Tub/shower unit Bathroom Toilet: Handicapped height Bathroom Accessibility: Yes Home Care Services: No Additional Comments: taken via PT note   Discharge Living Setting Plans for Discharge Living Setting: Patient's home Type of Home at Discharge: House Discharge Home Layout: One level Discharge Home Access: Stairs to enter Entrance Stairs-Rails: None Entrance Stairs-Number of Steps: 2 Discharge Bathroom Shower/Tub: Tub/shower unit Discharge Bathroom Toilet: Handicapped height Discharge  Bathroom Accessibility: Yes How Accessible: Accessible via walker Does the patient have any problems obtaining your medications?: No   Social/Family/Support Systems Patient Roles: Spouse Contact Information: [phone] Anticipated Caregiver: Viola Payeur Ability/Limitations of Caregiver: Can provide 24/7 min A Caregiver Availability: 24/7 Discharge Plan Discussed with Primary Caregiver: Yes Is Caregiver In Agreement with Plan?: Yes Does Caregiver/Family have Issues with Lodging/Transportation while Pt is in Rehab?: No   Goals Patient/Family Goal for Rehab: PT/OT/SLP Supervision Expected length of stay: 12-14 days Pt/Family Agrees to Admission and willing to participate: Yes Program Orientation Provided & Reviewed with Pt/Caregiver Including Roles  & Responsibilities: Yes   Decrease burden of Care through IP rehab admission: Specialzed equipment needs, Decrease number of caregivers, Bowel and bladder program, and Patient/family education   Possible need for SNF placement upon discharge: not anticipated   Patient Condition: I have reviewed medical records from [facility] , spoken with CM, and patient and spouse. I discussed via phone for inpatient rehabilitation assessment.  Patient will benefit from ongoing PT, OT, and SLP, can actively participate in 3 hours of therapy a day 5 days of the week, and can make measurable gains during the admission.  Patient will also benefit from the coordinated team approach during an Inpatient Acute Rehabilitation admission.  The patient will receive intensive therapy as well as Rehabilitation physician, nursing, social worker, and care management interventions.  Due to safety, skin/wound care, disease management, medication administration, pain management, and patient education the patient requires 24 hour a day rehabilitation nursing.  The patient is currently min A with mobility and basic ADLs.  Discharge setting and therapy post  discharge at home with home health is anticipated.  Patient has agreed to participate in the Acute Inpatient Rehabilitation Program and will admit today.   Preadmission Screen Completed By:  Saige Busby B Lavere Shinsky, 09/30/2021 1:51 PM ______________________________________________________________________   Discussed status with Dr. Swartz on 10/01/21 at 900 and received approval for admission today.   Admission Coordinator:  Marialuiza Car B Elasia Furnish, CCC-SLP, time 1025/Date 10/01/21    Assessment/Plan: Diagnosis: cerebellar infarcts Does the need for close, 24 hr/day Medical supervision in concert with the patient's rehab needs make it unreasonable for this patient to be served in a less intensive setting? Yes Co-Morbidities requiring supervision/potential complications: htn, gerd, post-stroke sequelae Due to bladder management, bowel management, safety, skin/wound care, disease management, medication administration, pain management, and patient education, does the patient require 24 hr/day rehab nursing? Yes Does the patient require coordinated care of a physician, rehab nurse, PT, OT, and SLP to address physical and functional deficits in the context of the above medical diagnosis(es)? Yes Addressing deficits in the following areas: balance, endurance, locomotion, strength, transferring, bowel/bladder control, bathing, dressing, feeding, grooming, toileting, cognition, and psychosocial support Can the patient actively participate in an intensive therapy program of at least 3 hrs of therapy 5 days a week? Yes The potential for patient to make measurable gains while on inpatient rehab is excellent Anticipated functional outcomes upon discharge from inpatient rehab:   supervision PT, supervision OT, supervision SLP Estimated rehab length of stay to reach the above functional goals is: 12-14 days Anticipated discharge destination: Home 10. Overall Rehab/Functional Prognosis: excellent     MD Signature: Zachary T.  Swartz, MD, FAAPMR [facility] Medical Director Rehabilitation Services 10/01/2021  

## 2021-10-01 NOTE — Progress Notes (Signed)
   10/01/21 1230  Assess: MEWS Score  BP 138/86  Pulse Rate 80  Resp 20  SpO2 97 %  Assess: MEWS Score  MEWS Temp 0  MEWS Systolic 0  MEWS Pulse 0  MEWS RR 0  MEWS LOC 0  MEWS Score 0  MEWS Score Color Green   Patient VS remain stable post metoprolol administration , pulse sustaining between 70*s and 80;s

## 2021-10-02 DIAGNOSIS — I639 Cerebral infarction, unspecified: Secondary | ICD-10-CM | POA: Diagnosis not present

## 2021-10-02 LAB — COMPREHENSIVE METABOLIC PANEL
ALT: 14 U/L (ref 0–44)
AST: 25 U/L (ref 15–41)
Albumin: 2.9 g/dL — ABNORMAL LOW (ref 3.5–5.0)
Alkaline Phosphatase: 48 U/L (ref 38–126)
Anion gap: 8 (ref 5–15)
BUN: 11 mg/dL (ref 8–23)
CO2: 26 mmol/L (ref 22–32)
Calcium: 8.7 mg/dL — ABNORMAL LOW (ref 8.9–10.3)
Chloride: 103 mmol/L (ref 98–111)
Creatinine, Ser: 0.83 mg/dL (ref 0.61–1.24)
GFR, Estimated: >60 mL/min (ref >60)
Glucose, Bld: 91 mg/dL (ref 70–99)
Potassium: 3.8 mmol/L (ref 3.5–5.1)
Sodium: 137 mmol/L (ref 135–145)
Total Bilirubin: 0.4 mg/dL (ref 0.3–1.2)
Total Protein: 6.5 g/dL (ref 6.5–8.1)

## 2021-10-02 LAB — CBC WITH DIFFERENTIAL/PLATELET
Abs Immature Granulocytes: 0.02 10*3/uL (ref 0.00–0.07)
Basophils Absolute: 0.1 10*3/uL (ref 0.0–0.1)
Basophils Relative: 1 %
Eosinophils Absolute: 0.3 10*3/uL (ref 0.0–0.5)
Eosinophils Relative: 6 %
HCT: 34.4 % — ABNORMAL LOW (ref 39.0–52.0)
Hemoglobin: 11.5 g/dL — ABNORMAL LOW (ref 13.0–17.0)
Immature Granulocytes: 0 %
Lymphocytes Relative: 20 %
Lymphs Abs: 1.1 10*3/uL (ref 0.7–4.0)
MCH: 31.4 pg (ref 26.0–34.0)
MCHC: 33.4 g/dL (ref 30.0–36.0)
MCV: 94 fL (ref 80.0–100.0)
Monocytes Absolute: 0.9 10*3/uL (ref 0.1–1.0)
Monocytes Relative: 16 %
Neutro Abs: 3.2 10*3/uL (ref 1.7–7.7)
Neutrophils Relative %: 57 %
Platelets: 240 10*3/uL (ref 150–400)
RBC: 3.66 MIL/uL — ABNORMAL LOW (ref 4.22–5.81)
RDW: 13.2 % (ref 11.5–15.5)
WBC: 5.7 10*3/uL (ref 4.0–10.5)
nRBC: 0 % (ref 0.0–0.2)

## 2021-10-02 NOTE — Progress Notes (Signed)
PROGRESS NOTE   Subjective/Complaints:  Occ cough, per OT, noted LLE ataxia during transfer Cont of bowel , has condom cath   ROS- limited by cognition , denies pain or SOB  Objective:   No results found. Recent Labs    10/01/21 0437 10/02/21 0540  WBC 5.3 5.7  HGB 10.8* 11.5*  HCT 32.9* 34.4*  PLT 220 240   Recent Labs    10/01/21 0437 10/02/21 0540  NA 137 137  K 3.2* 3.8  CL 103 103  CO2 27 26  GLUCOSE 101* 91  BUN 11 11  CREATININE 0.73 0.83  CALCIUM 8.2* 8.7*    Intake/Output Summary (Last 24 hours) at 10/02/2021 0944 Last data filed at 10/02/2021 0610 Gross per 24 hour  Intake 360 ml  Output 1000 ml  Net -640 ml        Physical Exam: Vital Signs There were no vitals taken for this visit.  EENT- right lens opacified  General: No acute distress Mood and affect are appropriate Heart: Regular rate and rhythm no rubs murmurs or extra sounds Lungs: Clear to auscultation, breathing unlabored, no rales or wheezes Abdomen: Positive bowel sounds, soft nontender to palpation, nondistended Extremities: No clubbing, cyanosis, or edema Skin: No evidence of breakdown, no evidence of rash Neurologic: EOMI, no ptosis , motor strength is 5/5 in bilateral deltoid, bicep, tricep, grip, hip flexor, knee extensors, ankle dorsiflexor and plantar flexor Sensory exam normal sensation to light touch and proprioception in bilateral upper and lower extremities Cerebellar exam moderate ataxia Left Finger nose finger  Musculoskeletal: Full range of motion in all 4 extremities. No joint swelling   Assessment/Plan: 1. Functional deficits which require 3+ hours per day of interdisciplinary therapy in a comprehensive inpatient rehab setting. Physiatrist is providing close team supervision and 24 hour management of active medical problems listed below. Physiatrist and rehab team continue to assess barriers to  discharge/monitor patient progress toward functional and medical goals  Care Tool:  Bathing              Bathing assist       Upper Body Dressing/Undressing Upper body dressing   What is the patient wearing?: Hospital gown only    Upper body assist Assist Level: Moderate Assistance - Patient 50 - 74%    Lower Body Dressing/Undressing Lower body dressing      What is the patient wearing?: Incontinence brief     Lower body assist Assist for lower body dressing: Moderate Assistance - Patient 50 - 74%     Toileting Toileting    Toileting assist       Transfers Chair/bed transfer  Transfers assist           Locomotion Ambulation   Ambulation assist              Walk 10 feet activity   Assist           Walk 50 feet activity   Assist           Walk 150 feet activity   Assist           Walk 10 feet on uneven surface  activity  Assist           Wheelchair     Assist               Wheelchair 50 feet with 2 turns activity    Assist            Wheelchair 150 feet activity     Assist          There were no vitals taken for this visit.  Medical Problem List and Plan: 1. 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 Left cerebellar infarct extends into dorsal left midbrain onset 09/25/21.             -patient may  shower             -ELOS/Goals: 12-14 days, supervision goals with PT, OT, SLP 2.  Antithrombotics: -DVT/anticoagulation:  Pharmaceutical: Lovenox             -antiplatelet therapy: Plavix and aspirin for 3 months then aspirin alone 3. Pain Management: Tylenol as needed 4. Mood: LCSW to evaluate and provide emotional support             -antipsychotic agents: n/a 5. Neuropsych: This patient is capable of making decisions on his own behalf. 6. Skin/Wound Care: Routine skin care checks 7. Fluids/Electrolytes/Nutrition: Routine Is and Os and follow-up  chemistries             -dys 3 diet with nectar; crush meds 8: Dysphagia; continue dysphagia 3 diet with nectar thick liquids             --SLP eval 9: Hypertension: continue metoprolol 50 mg daily             --home Catapres and Benicar held and not restarted There were no vitals filed for this visit.  10: Hyperlipidemia: continue Crestor 20 mg daily             --home Zetia and Repatha held not restarted 11: Prediabetes: random serum glucoses and CBGs within range --follow-up/monitor with PCP 12: GERD: continue Protonix 40 mg daily 13: Hypokalemia: recheck BMP improved , monitor weekly              -has received supplement 14: Anemia: not symptomatic; follow-up CBC    Latest Ref Rng & Units 10/02/2021    5:40 AM 10/01/2021    4:37 AM 09/25/2021    4:00 AM  CBC  WBC 4.0 - 10.5 K/uL 5.7   5.3   6.1    Hemoglobin 13.0 - 17.0 g/dL 11.5   10.8   12.8    Hematocrit 39.0 - 52.0 % 34.4   32.9   39.4    Platelets 150 - 400 K/uL 240   220   208      15. Episodes of SVT--responded to IV metoprolol x 1 dose             -f/u EKG confirmed NSR             -continue metoprolol '50mg'$  daily as bove.      LOS: 1 days A FACE TO Ralston E Trever Streater 10/02/2021, 9:44 AM

## 2021-10-02 NOTE — Evaluation (Signed)
Physical Therapy Assessment and Plan  Patient Details  Name: NEVAAN BUNTON MRN: [id number] Date of Birth: 1945-05-14  PT Diagnosis: Abnormal posture, Abnormality of gait, Ataxia, Ataxic gait, Cognitive deficits, Coordination disorder, Difficulty walking, and Impaired cognition Rehab Potential: Good ELOS: ~2-2.5 weeks   Today's Date: 10/02/2021 PT Individual Time: 8657-8469 PT Individual Time Calculation (min): 76 min    Hospital Problem: Principal Problem:   Cerebellar stroke [facility])   Past Medical History:  Past Medical History:  Diagnosis Date   Cataract    HLD (hyperlipidemia)    Hypertension    Past Surgical History:  Past Surgical History:  Procedure Laterality Date   COLONOSCOPY  11/19/2009   RMR: 1. Multiple small rectal polyps status post snare polypectomy. 2. Multiple left colon polyp status post snare removal . Poor prep compromised exam.    COLONOSCOPY N/A 12/13/2014   Procedure: COLONOSCOPY;  Surgeon: Daneil Dolin, MD;  [facility];  Service: Endoscopy;  Laterality: N/A;  945   COLONOSCOPY N/A 07/18/2020   Procedure: COLONOSCOPY;  Surgeon: Daneil Dolin, MD;  [facility]: [facility];  Service: Endoscopy;  Laterality: N/A;  am   FLEXIBLE SIGMOIDOSCOPY N/A 04/25/2020   Procedure: FLEXIBLE SIGMOIDOSCOPY;  Surgeon: Daneil Dolin, MD; incomplete colonoscopy with flex sig only due to poor prep.   HERNIA REPAIR     POLYPECTOMY  07/18/2020   Procedure: POLYPECTOMY;  Surgeon: Daneil Dolin, MD;  [facility]: [facility];  Service: Endoscopy;;    Assessment & Plan Clinical Impression: Patient is a 76 y.o. year old male who presented to the emergency department complaining of acute onset of imbalance while trying to stand on the morning of 09/25/2021. This was associated with dizziness, generalized weakness and nausea. EMS was called and the patient was able to ambulate to the stretcher. There was report of recent alcohol intake. BAC level negative. UDS  negative. CTH negative for acute hemorrhage. Physical exam significant for left upper and lower extremity ataxia. MRI positive for acute infarcts involving the left cerebellum extending into the dorsal and left midbrain; two small foci of acute infarction in the right cerebellum. Neurology consulted. No tPA as outside window. No LVO. Etiology likely atheroembolic versus cardioembolic. TEE done 09/21/2021. Aspirin 81 mg and Plavix 75 mg daily for 90 days followed by aspirin 81 mg daily. Recommend 30 day event monitor at discharge. 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    He is alert and oriented to person, place, month; slow to recall president's name. States he lives with his wife, Oleta Mouse. Neither one of them drive and states his wife's sister drives for them. He says he drinks vodka with water, but will not quantify his drinking stating he quit when he "got sick", referring to this hospitalization. He states he quit smoking years ago. Denies pain. Eating ok and had a bowel movement this morning stating some pudding "tore my stomach up". States his swallowing has improved since admission. Patient transferred to CIR on 10/01/2021 .   Patient currently requires min/mod assist with mobility secondary to   , decreased cardiorespiratoy endurance, unbalanced muscle activation, ataxia, and decreased coordination, decreased awareness, decreased problem solving, decreased safety awareness, decreased memory, and delayed processing, and decreased standing balance, decreased postural control, and decreased balance strategies.  Prior to hospitalization, patient was independent  with mobility and lived with Spouse in a House home.  Home access is 2Stairs to enter.  Patient will benefit from skilled PT intervention  to maximize safe functional mobility, minimize fall risk, and decrease caregiver burden for planned discharge home with 24 hour supervision.  Anticipate patient will benefit from follow up [place] at discharge.  PT - End of Session Activity Tolerance: Tolerates 30+ min activity with multiple rests Endurance Deficit: Yes Endurance Deficit Description: mild deconditioning PT Assessment Rehab Potential (ACUTE/IP ONLY): Good PT Barriers to Discharge: Home environment access/layout;Nutrition means PT Patient demonstrates impairments in the following area(s): Balance;Motor;Safety;Behavior;Nutrition;Sensory;Edema;Pain;Skin Integrity;Endurance;Perception PT Transfers Functional Problem(s): Bed Mobility;Bed to Chair;Car;Furniture PT Locomotion Functional Problem(s): Ambulation;Stairs;Wheelchair Mobility PT Plan PT Intensity: Minimum of 1-2 x/day ,45 to 90 minutes PT Frequency: 5 out of 7 days PT Duration Estimated Length of Stay: ~2-2.5 weeks PT Treatment/Interventions: Ambulation/gait training;Balance/vestibular training;Cognitive remediation/compensation;Community reintegration;Discharge planning;Disease management/prevention;DME/adaptive equipment instruction;Functional electrical stimulation;Functional mobility training;Neuromuscular re-education;Pain management;Patient/family education;Psychosocial support;Skin care/wound management;Stair training;Therapeutic Activities;Therapeutic Exercise;UE/LE Strength taining/ROM;UE/LE Coordination activities;Visual/perceptual remediation/compensation;Wheelchair propulsion/positioning PT Transfers Anticipated Outcome(s): supervision using LRAD PT Locomotion Anticipated Outcome(s): supervision using LRAD PT Recommendation Follow Up Recommendations: Home health PT;24 hour supervision/assistance Patient destination: Home Equipment Recommended: To be determined   PT Evaluation Precautions/Restrictions Precautions Precautions: Fall;Other (comment) Precaution  Comments: HOH, dysphagia diet, L hemibody ataxia Restrictions Weight Bearing Restrictions: No Pain Pain Assessment Pain Scale: 0-10 Pain Score: 0-No pain (states no pain currently but has previously had headache pain) Pain Interference Pain Interference Pain Effect on Sleep: 1. Rarely or not at all Pain Interference with Therapy Activities: 1. Rarely or not at all Pain Interference with Day-to-Day Activities: 1. Rarely or not at all Home Living/Prior [place] Available Help at Discharge: Family;Available 24 hours/day Type of Home: House Home Access: Stairs to enter (1st step-up onto concrete walkway & 2nd into the house) Entrance Stairs-Number of Steps: 2 Entrance Stairs-Rails: None Home Layout: One level  Lives With: Spouse (wife, Oleta Mouse) Prior Function Level of Independence: Independent with gait;Independent with transfers (pt states he had a RW but did not use it)  Able to Take Stairs?: Yes Driving: No Vocation: On disability Vision/Perception  Vision - History Patient Visual Report: No change from baseline (pt reports no change from baseline has chronic cataracts in R eye stating "they've been operated on" and calls his R eye "my bad eye") Perception Perception: Within Functional Limits Praxis Praxis: Intact  Cognition  Overall Cognitive Status: Within Functional Limits for tasks assessed Arousal/Alertness: Awake/alert Orientation Level: Oriented X4 (initially states he is at [facility] but when therapist stated it back to him he was able to correct himself) Year: 2023 Month: May Day of Week: Correct Attention: Focused;Sustained;Selective Focused Attention: Appears intact Sustained Attention: Appears intact Selective Attention: Impaired Memory: Impaired Awareness: Impaired Problem Solving: Impaired Safety/Judgment: Impaired Sensation Sensation Light Touch: Appears Intact Hot/Cold: Not tested Proprioception: Impaired by gross  assessment Stereognosis: Not tested Coordination Gross Motor Movements are Fluid and Coordinated: No Fine Motor Movements are Fluid and Coordinated: No Coordination and Movement Description: L hemibody ataxia and impaired balance Motor  Motor Motor: Ataxia Motor - Skilled Clinical Observations: Left hemibody ataxia   Trunk/Postural Assessment  Cervical Assessment Cervical Assessment: Exceptions to [facility] (forward head) Thoracic Assessment Thoracic Assessment: Exceptions to [facility] (rounded shoulders with thoracic kyphosis) Lumbar Assessment Lumbar Assessment: Exceptions to [facility] (posterior pelvic tilt in sitting) Postural Control Postural Control: Deficits on evaluation (pt reports not feeling safe attempting to stand without B UE support on RW due  to balance impairments) Trunk Control: impaired Righting Reactions: delayed and inefficient Protective Responses: delayed and inefficient  Balance Balance Balance Assessed: Yes Standardized Balance Assessment Standardized Balance Assessment: Berg Balance Test Berg Balance Test Sit to Stand: Needs minimal aid to stand or to stabilize Standing Unsupported: Unable to stand 30 seconds unassisted (pt reports fear of falling when attempting to stand without UE support) Sitting with Back Unsupported but Feet Supported on Floor or Stool: Able to sit 2 minutes under supervision Stand to Sit: Needs assistance to sit Transfers: Needs one person to assist Standing Unsupported with Eyes Closed: Needs help to keep from falling Standing Ubsupported with Feet Together: Needs help to attain position and unable to hold for 15 seconds From Standing, Reach Forward with Outstretched Arm: Loses balance while trying/requires external support From Standing Position, Pick up Object from Floor: Unable to try/needs assist to keep balance From Standing Position, Turn to Look Behind Over each Shoulder: Needs assist to keep from losing balance and falling Turn 360  Degrees: Needs assistance while turning Standing Unsupported, Alternately Place Feet on Step/Stool: Needs assistance to keep from falling or unable to try Standing Unsupported, One Foot in Front: Loses balance while stepping or standing Standing on One Leg: Unable to try or needs assist to prevent fall Total Score: 5 Static Sitting Balance Static Sitting - Balance Support: Feet unsupported Static Sitting - Level of Assistance: 5: Stand by assistance Dynamic Sitting Balance Dynamic Sitting - Balance Support: During functional activity;Feet supported Dynamic Sitting - Level of Assistance: 4: Min assist;Other (comment) (CGA) Static Standing Balance Static Standing - Balance Support: Bilateral upper extremity supported;During functional activity Static Standing - Level of Assistance: 4: Min assist Dynamic Standing Balance Dynamic Standing - Balance Support: Bilateral upper extremity supported;During functional activity Dynamic Standing - Level of Assistance: 4: Min assist;3: Mod assist Extremity Assessment      RLE Assessment RLE Assessment: Within Functional Limits Active Range of Motion (AROM) Comments: WFL although generalized "stiffness" noted throughout General Strength Comments: assessed in sitting to be grossly 5/5 RLE Strength Right Hip Flexion: 5/5 Right Knee Flexion: 5/5 Right Knee Extension: 5/5 Right Ankle Dorsiflexion: 5/5 Right Ankle Plantar Flexion: 5/5 LLE Assessment LLE Assessment: Within Functional Limits Active Range of Motion (AROM) Comments: WFL although generalized "stiffness" noted throughout General Strength Comments: assessed in sitting with no difference compared to RLE LLE Strength Left Hip Flexion: 5/5 Left Knee Flexion: 5/5 Left Knee Extension: 5/5 Left Ankle Dorsiflexion: 5/5 Left Ankle Plantar Flexion: 5/5  Care Tool Care Tool Bed Mobility Roll left and right activity   Roll left and right assist level: Minimal Assistance - Patient > 75%    Sit  to lying activity   Sit to lying assist level: Minimal Assistance - Patient > 75%    Lying to sitting on side of bed activity   Lying to sitting on side of bed assist level: the ability to move from lying on the back to sitting on the side of the bed with no back support.: Minimal Assistance - Patient > 75%     Care Tool Transfers Sit to stand transfer   Sit to stand assist level: Moderate Assistance - Patient 50 - 74%    Chair/bed transfer   Chair/bed transfer assist level: Moderate Assistance - Patient 50 - 74%     Psychologist, counselling transfer activity did not occur: Safety/medical concerns (Pt did not feel safe unless using RW, which he  did not use at baseline)        Care Tool Locomotion Ambulation Ambulation activity did not occur: Safety/medical concerns (Pt did not feel safe attempting gait unless using RW, which he did not use at baseline)        Walk 10 feet activity Walk 10 feet activity did not occur: Safety/medical concerns       Walk 50 feet with 2 turns activity Walk 50 feet with 2 turns activity did not occur: Safety/medical concerns      Walk 150 feet activity Walk 150 feet activity did not occur: Safety/medical concerns      Walk 10 feet on uneven surfaces activity Walk 10 feet on uneven surfaces activity did not occur: Safety/medical concerns      Stairs Stair activity did not occur: Safety/medical concerns (required use of HRs to perform safely, which pt does not have at home)        Walk up/down 1 step activity Walk up/down 1 step or curb (drop down) activity did not occur: Safety/medical concerns      Walk up/down 4 steps activity Walk up/down 4 steps activity did not occur: Safety/medical concerns      Walk up/down 12 steps activity Walk up/down 12 steps activity did not occur: Safety/medical concerns      Pick up small objects from floor   Pick up small object from the floor assist level: Total Assistance - Patient <  25%    Wheelchair Is the patient using a wheelchair?: Yes (for transport to/from gym)     Wheelchair assist level: Dependent - Patient 0%    Wheel 50 feet with 2 turns activity   Assist Level: Dependent - Patient 0%  Wheel 150 feet activity   Assist Level: Dependent - Patient 0%    Refer to Care Plan for Long Term Goals  SHORT TERM GOAL WEEK 1 PT Short Term Goal 1 (Week 1): Pt will perform supine<>sit with CGA consistently PT Short Term Goal 2 (Week 1): Pt will perform sit<>stands using LRAD with CGA consistently PT Short Term Goal 3 (Week 1): Pt will perform bed<>chair transfers using LRAD with CGA consistently PT Short Term Goal 4 (Week 1): Pt will ambulate at least 162f using LRAD with CGA PT Short Term Goal 5 (Week 1): Pt will navigate 2 steps per home set-up with min assist  Recommendations for other services: None   Skilled Therapeutic Intervention Pt received sitting in w/c and agreeable to therapy session. Evaluation completed (see details above) with patient education regarding purpose of PT evaluation, PT POC and goals, therapy schedule, weekly team meetings, and other CIR information including safety plan and fall risk safety. Pt completed the below functional mobility tasks with the specified levels of skilled assistance and cuing. Upon standing without AD pt reports he does not feel safe attempting gait without RW. Pt demonstrates L hemibody ataxia impacting safety and independence with mobility. Pt demonstrates L LE circumduction type ataxic movement during gait and has quick back and forth jerking movements of AD due to UE ataxia during gait requiring therapist assist to stabilize walker for pt safety. Pt noted to have an extensive coughing episode during session, spitting out thickened saliva - provided trash can for patient.  Of note: Pt's HR noted to be 55bpm at beginning of session. SpO2 95% during spot checks of HR throughout session. After gait, pt's HR 46bpm but not  symptomatic and averages in the mid [id number] - nurse and PA made aware.  At end of session, pt agreeable to remain sitting up in w/c and left with needs in reach and seat belt alarm on.  Mobility Bed Mobility Bed Mobility: Supine to Sit;Sit to Supine Supine to Sit: Contact Guard/Touching assist;Minimal Assistance - Patient > 75% Sit to Supine: Contact Guard/Touching assist;Minimal Assistance - Patient > 75% Transfers Transfers: Sit to Stand;Stand to Sit;Stand Pivot Transfers Sit to Stand: Minimal Assistance - Patient > 75% Stand to Sit: Minimal Assistance - Patient > 75% Stand Pivot Transfers: Moderate Assistance - Patient 50 - 74% Stand Pivot Transfer Details: Verbal cues for sequencing;Manual facilitation for placement;Tactile cues for placement;Visual cues/gestures for precautions/safety;Verbal cues for technique;Verbal cues for precautions/safety;Manual facilitation for weight shifting;Verbal cues for safe use of DME/AE Transfer (Assistive device): Rolling walker Locomotion  Gait Ambulation: Yes Gait Assistance: Moderate Assistance - Patient 50-74%;Minimal Assistance - Patient > 75% Gait Distance (Feet): 95 Feet (71f initially) Assistive device: Rolling walker Gait Assistance Details: Tactile cues for posture;Tactile cues for weight shifting;Tactile cues for sequencing;Tactile cues for initiation;Visual cues for safe use of DME/AE;Verbal cues for precautions/safety;Verbal cues for technique;Verbal cues for gait pattern;Verbal cues for safe use of DME/AE;Manual facilitation for weight shifting Gait Gait: Yes Gait Pattern: Impaired Gait Pattern: Ataxic;Wide base of support;Decreased step length - left;Decreased stance time - left;Left circumduction;Poor foot clearance - left (L LE ataxia noted during swing, L UE ataxia causing RW to be unsteady) Gait velocity: decreased Stairs / Additional Locomotion Stairs: Yes Stairs Assistance: Minimal Assistance - Patient > 75%;Moderate  Assistance - Patient 50 - 74% Stair Management Technique: Two rails;Step to pattern;Forwards Number of Stairs: 4 Height of Stairs: 6 Wheelchair Mobility Wheelchair Mobility: No   Discharge Criteria: Patient will be discharged from PT if patient refuses treatment 3 consecutive times without medical reason, if treatment goals not met, if there is a change in medical status, if patient makes no progress towards goals or if patient is discharged from hospital.  The above assessment, treatment plan, treatment alternatives and goals were discussed and mutually agreed upon: by patient  CTawana Scale, PT, DPT, NCS, CSRS 10/02/2021, 10:50 AM

## 2021-10-02 NOTE — Discharge Summary (Incomplete)
Physician Discharge Summary  Patient ID: Javier [name] MRN: [id number] DOB/AGE: 09-29-1945 76 y.o.  Admit date: 10/01/2021 Discharge date:   Discharge Diagnoses:  Principal Problem:   Cerebellar stroke [facility]) Active problems: Functional deficits secondary to cerebellar stroke LLE ataxia Dysphagia Hypertension Hyperlipidemia Prediabetes GERD Hypokalemia Anemia Episodic SVT, stable  Discharged Condition: {condition:18240}  Significant Diagnostic Studies: Narrative & Impression  CLINICAL DATA:  Cough.  Episode of tachycardia yesterday.   EXAM: CHEST - 2 VIEW   COMPARISON:  None Available.   FINDINGS: The cardiomediastinal silhouette is within normal limits. No airspace consolidation, edema, pleural effusion, or pneumothorax is identified. Multiple old left-sided rib fractures are noted.   IMPRESSION: No active cardiopulmonary disease.     Electronically Signed   By: Logan Bores M.D.   On: 10/08/2021 15:10       Labs:  Basic Metabolic Panel: Recent Labs  Lab 10/01/21 0437 10/02/21 0540  NA 137 137  K 3.2* 3.8  CL 103 103  CO2 27 26  GLUCOSE 101* 91  BUN 11 11  CREATININE 0.73 0.83  CALCIUM 8.2* 8.7*    CBC: Recent Labs  Lab 10/01/21 0437 10/02/21 0540  WBC 5.3 5.7  NEUTROABS  --  3.2  HGB 10.8* 11.5*  HCT 32.9* 34.4*  MCV 94.8 94.0  PLT 220 240    CBG: No results for input(s): GLUCAP in the last 168 hours.  Brief HPI:   Javier [name] is a 76 y.o. male who presented to the emergency department on 09/25/2021 complaining of imbalance, dizziness generalized weakness and nausea.  MRI was positive for acute infarcts involving the left cerebellum.  Neurology was consulted.  He was started on Plavix and aspirin.  He was evaluated for episodes of supraventricular tachycardia and responded to IV metoprolol x1 dose.  EKG confirmed normal sinus rhythm.  Metoprolol 50 mg daily continued.   Hospital Course: [name] was admitted to  rehab 10/01/2021 for inpatient therapies to consist of PT, ST and OT at least three hours five days a week. Past admission physiatrist, therapy team and rehab RN have worked together to provide customized collaborative inpatient rehab. Follow-up labs showed improvement in hypokalemia and in H and H. Re-developed palpitations and tachycardia on 6/5. Converted spontaneously. Cardiology consulted. EKG c/w SVT at 147 with ST depression. No room to increase beta blocker due to low resting heart rates. TSH normal. Plan 30 day event monitor at discharge. Continued with complaints of persistent cough and CXR performed on 6/6 without acute findings. Fourth episode of SVT since total hospital admission occurred on 6/7.   Blood pressures were monitored on TID basis and were initially 923-300 systolic allowing for permissive hypertension on metoprolol 50 mg BID. Low dose irbesartan 37.5 mg started on 6/2.   Rehab course: During patient's stay in rehab weekly team conferences were held to monitor patient's progress, set goals and discuss barriers to discharge. At admission, patient required min to mod assistance with transfers, mobility and locomotion. SLUMS score 8/30. Mod assist with ADLs.  He has had improvement in activity tolerance, balance, postural control as well as ability to compensate for deficits. He has had improvement in functional use RUE/LUE  and RLE/LLE as well as improvement in awareness       Disposition: Home There are no questions and answers to display.         Diet: Heart healthy  Special Instructions:   Allergies as of 10/02/2021   No Known Allergies  Med Rec must be completed prior to using this [facility]***        Signed: Barbie Banner 10/02/2021, 8:25 AM

## 2021-10-02 NOTE — Evaluation (Signed)
Speech Language Pathology Assessment and Plan  Patient Details  Name: Javier [name] MRN: [id number] Date of Birth: 03/06/1946  SLP Diagnosis: Cognitive Impairments;Dysphagia  Rehab Potential: Fair ELOS: 2-2.5 weeks   Today's Date: 10/02/2021 SLP Individual Time: 1440-1540 SLP Individual Time Calculation (min): 60 min  Hospital Problem: Principal Problem:   Cerebellar stroke [facility])  Past Medical History:  Past Medical History:  Diagnosis Date   Cataract    HLD (hyperlipidemia)    Hypertension    Past Surgical History:  Past Surgical History:  Procedure Laterality Date   COLONOSCOPY  11/19/2009   RMR: 1. Multiple small rectal polyps status post snare polypectomy. 2. Multiple left colon polyp status post snare removal . Poor prep compromised exam.    COLONOSCOPY N/A 12/13/2014   Procedure: COLONOSCOPY;  Surgeon: Daneil Dolin, MD;  [facility];  Service: Endoscopy;  Laterality: N/A;  945   COLONOSCOPY N/A 07/18/2020   Procedure: COLONOSCOPY;  Surgeon: Daneil Dolin, MD;  Location: [facility];  Service: Endoscopy;  Laterality: N/A;  am   FLEXIBLE SIGMOIDOSCOPY N/A 04/25/2020   Procedure: FLEXIBLE SIGMOIDOSCOPY;  Surgeon: Daneil Dolin, MD; incomplete colonoscopy with flex sig only due to poor prep.   HERNIA REPAIR     POLYPECTOMY  07/18/2020   Procedure: POLYPECTOMY;  Surgeon: Daneil Dolin, MD;  Location: [facility];  Service: Endoscopy;;    Assessment / Plan / Recommendation Clinical Impression  Pt presenting with cognitive deficits in the areas of sustained attention, basic problem solving, short-term recall, and intellectual awareness. SLP administered the [facility] Mental Status Examination (SLUMS) and patient scored 8/30 points with a score of 25 or above considered within normal range.  Pt scored 9/26 on 5/26 in acute care. Pt with minimal awareness of physical/cognitive deficits s/p CVA. Pt appeared disinterested, made minimal-to-no  eye contact with therapist, and required max A encouragement to participate in assessment. Pt significantly hard of hearing which suspect impacts comprehension. Verbal expression appeared [facility] for all tasks assessed and patient was 100% intelligible at the conversation level without indication for dysarthria.  BSE completed. Pt exhibited delayed throat clearing and immediate cough response x1 with thin liquid cup sips. No overt s/sx of aspiration observed with nectar thick liquids. Pt consumed dys 3 textures with mildly prolonged yet functional mastication likely secondary to edentulous status. SLP recommends continuation of dys 3 diet with nectar thick liquids. Meds whole in puree. Full supervision d/t noted impulsivity with intake while in acute care. Continue to assess liquid tolerance at bedside. SLP to likely be able to advance liquids at bedside as pt progresses per MBS results on 5/26.    Patient would benefit from skilled SLP intervention to maximize cognitive and swallowing functioning and overall functional independence prior to discharge.   Skilled Therapeutic Interventions          Pt participated in [place] Status Examination (SLUMS) as well as further non-standardized assessments of cognitive-linguistic, speech, and language function. Please see above.     SLP Assessment  Patient will need skilled Speech Lanaguage Pathology Services during CIR admission    Recommendations  SLP Diet Recommendations: Dysphagia 3 (Mech soft);Nectar Liquid Administration via: Cup;Straw Medication Administration: Whole meds with puree Supervision: Patient able to self feed;Full supervision/cueing for compensatory strategies;Staff to assist with self feeding Compensations: Slow rate;Small sips/bites;Minimize environmental distractions;Multiple dry swallows after each bite/sip;Clear throat intermittently Postural Changes and/or Swallow Maneuvers: Seated upright 90 degrees Oral Care  Recommendations:  Oral care BID Recommendations for Other Services: Neuropsych consult Patient destination: Home Follow up Recommendations: 24 hour supervision/assistance;Home Health SLP;Outpatient SLP Equipment Recommended: To be determined    SLP Frequency 3 to 5 out of 7 days   SLP Duration  SLP Intensity  SLP Treatment/Interventions 2-2.5 weeks  Minumum of 1-2 x/day, 30 to 90 minutes  Cognitive remediation/compensation;Environmental controls;Internal/external aids;Dysphagia/aspiration precaution training;Functional tasks;Therapeutic Activities;Patient/family education    Pain    Prior Functioning Cognitive/Linguistic Baseline: Information not available Type of Home: House  Lives With: Spouse Available Help at Discharge: Family;Available 24 hours/day Education: 8th grade Vocation: On disability  SLP Evaluation Cognition Overall Cognitive Status: Impaired/Different from baseline Arousal/Alertness: Awake/alert Orientation Level: Oriented X4 Year: 2023 Month: May Day of Week: Correct Focused Attention: Appears intact Sustained Attention: Impaired Sustained Attention Impairment: Verbal basic;Functional basic Selective Attention: Impaired Selective Attention Impairment: Verbal basic;Functional basic Memory: Impaired Memory Impairment: Retrieval deficit;Decreased short term memory;Decreased recall of new information Decreased Short Term Memory: Verbal basic;Functional basic Awareness: Impaired Awareness Impairment: Intellectual impairment Problem Solving: Impaired Problem Solving Impairment: Verbal basic;Functional basic Executive Function:  (lower level deficits impacting executive functions) Safety/Judgment: Impaired  Comprehension Auditory Comprehension Overall Auditory Comprehension: Impaired Conversation: Simple Interfering Components: Attention;Working Marine scientist;Hearing EffectiveTechniques: Extra processing time;Increased volume;Slowed speech Visual  Recognition/Discrimination Discrimination: Not tested Reading Comprehension Reading Status: Not tested Expression Expression Primary Mode of Expression: Verbal Verbal Expression Overall Verbal Expression: Appears within functional limits for tasks assessed Oral Motor Oral Motor/Sensory Function Overall Oral Motor/Sensory Function: Within functional limits Motor Speech Overall Motor Speech: Appears within functional limits for tasks assessed Respiration: Within functional limits Phonation: Normal Resonance: Within functional limits Articulation: Within functional limitis Intelligibility: Intelligible Motor Planning: Witnin functional limits Motor Speech Errors: Not applicable  Care Tool Care Tool Cognition Ability to hear (with hearing aid or hearing appliances if normally used Ability to hear (with hearing aid or hearing appliances if normally used): 2. Moderate difficulty - speaker has to increase volume and speak distinctly   Expression of Ideas and Wants Expression of Ideas and Wants: 3. Some difficulty - exhibits some difficulty with expressing needs and ideas (e.g, some words or finishing thoughts) or speech is not clear   Understanding Verbal and Non-Verbal Content Understanding Verbal and Non-Verbal Content: 3. Usually understands - understands most conversations, but misses some part/intent of message. Requires cues at times to understand  Memory/Recall Ability Memory/Recall Ability : Current season;That he or she is in a hospital/hospital unit   PMSV Assessment  PMSV Trial Intelligibility: Intelligible  Bedside Swallowing Assessment General Date of Onset: 09/25/21 Previous Swallow Assessment: 5/26 Diet Prior to this Study: Dysphagia 3 (soft);Nectar-thick liquids Temperature Spikes Noted: No Respiratory Status: Room air History of Recent Intubation: No Behavior/Cognition: Alert Oral Cavity - Dentition: Edentulous;Dentures, not available Self-Feeding Abilities:  Able to feed self;Needs assist;Needs set up Vision: Functional for self-feeding Patient Positioning: Upright in chair/Tumbleform Baseline Vocal Quality: Normal Volitional Cough: Strong Volitional Swallow: Able to elicit  Oral Care Assessment Does patient have any of the following "high(er) risk" factors?: None of the above Does patient have any of the following "at risk" factors?: Other - dysphagia Patient is AT RISK: Order set for Adult Oral Care Protocol initiated -  "At Risk Patients" option selected (see row information) Ice Chips Ice chips: Not tested Thin Liquid Thin Liquid: Impaired Pharyngeal  Phase Impairments: Cough - Immediate;Throat Clearing - Delayed Nectar Thick Nectar Thick Liquid: Within functional limits Honey Thick Honey Thick Liquid: Not tested Puree Puree: Within functional limits Solid  Solid: Impaired Oral Phase Impairments: Impaired mastication BSE Assessment Risk for Aspiration Impact on safety and function: Mild aspiration risk;Moderate aspiration risk Other Related Risk Factors: Cognitive impairment  Short Term Goals: Week 1: SLP Short Term Goal 1 (Week 1): Pt will demonstrate selective attention to functional tasks for 10 minute duration with mod A verbal redirection SLP Short Term Goal 2 (Week 1): Pt will recall novel information with mod A for use of compensatory memory strategies to achieve 50% accuracy SLP Short Term Goal 3 (Week 1): Pt will complete basic problem solving with mod A verbal/visual cues to achieve 50% accuracy SLP Short Term Goal 4 (Week 1): Pt will demonstrate inellectual awareness of deficits by verbalizing at least 1 physical and 1 cognitive impairment/change s/p CVA with moderate verbal question/contextual cues SLP Short Term Goal 5 (Week 1): Patient will consume current diet with minimal overt s/sx of aspiration with min A for implementation of safe swallowing precautions/strategies SLP Short Term Goal 6 (Week 1): Pt will consume  thin liquids with minimal overt s/sx of aspiration and min A cues to take small/single sips across 2-3 tx sessions prior to advancing liquids  Refer to Care Plan for Long Term Goals  Recommendations for other services: Neuropsych  Discharge Criteria: Patient will be discharged from SLP if patient refuses treatment 3 consecutive times without medical reason, if treatment goals not met, if there is a change in medical status, if patient makes no progress towards goals or if patient is discharged from hospital.  The above assessment, treatment plan, treatment alternatives and goals were discussed and mutually agreed upon: by patient  Patty Sermons 10/02/2021, 5:46 PM

## 2021-10-02 NOTE — Progress Notes (Signed)
[place] [place] Individual Statement of Services  Patient Name:  Javier [name]  Date:  10/02/2021  Welcome to the [facility].  Our goal is to provide you with an individualized program based on your diagnosis and situation, designed to meet your specific needs.  With this comprehensive rehabilitation program, you will be expected to participate in at least 3 hours of rehabilitation therapies Monday-Friday, with modified therapy programming on the weekends.  Your rehabilitation program will include the following services:  Physical Therapy (PT), Occupational Therapy (OT), Speech Therapy (ST), 24 hour per day rehabilitation nursing, Therapeutic Recreaction (TR), Neuropsychology, Rehabilitation Medicine, Nutrition Services, Pharmacy Services, and Other  Weekly team conferences will be held on Wednesdays to discuss your progress.  Your Inpatient Rehabilitation Care Coordinator will talk with you frequently to get your input and to update you on team discussions.  Team conferences with you and your family in attendance may also be held.  Expected length of stay:  12-14 Days  Overall anticipated outcome:  Supervision  Depending on your progress and recovery, your program may change. Your Inpatient Rehabilitation Care Coordinator will coordinate services and will keep you informed of any changes. Your Inpatient Rehabilitation Care Coordinator's name and contact numbers are listed  below.  The following services may also be recommended but are not provided by the [facility]:   [place] will be made to provide these services after discharge if needed.  Arrangements include referral to agencies that provide these services.  Your insurance has been verified to be:   [employer]  Your primary doctor is:  NO PCP  Pertinent information will be shared with your  doctor and your insurance company.  Inpatient Rehabilitation Care Coordinator:  Erlene Quan, [place] or [phone]  Information discussed with and copy given to patient by: [name], 10/02/2021, 3:09 PM

## 2021-10-02 NOTE — Evaluation (Signed)
Occupational Therapy Assessment and Plan  Patient Details  Name: Javier [name] MRN: [id number] Date of Birth: [birth year]  OT Diagnosis: abnormal posture, ataxia, cognitive deficits, disturbance of vision, and hemiplegia affecting dominant side Rehab Potential: Rehab Potential (ACUTE ONLY): Good ELOS: ~2.5 weeks   Today's Date: 10/02/2021 OT Individual Time: 0900-1000 OT Individual Time Calculation (min): 60 min     Hospital Problem: Principal Problem:   Cerebellar stroke [facility])   Past Medical History:  Past Medical History:  Diagnosis Date   Cataract    HLD (hyperlipidemia)    Hypertension    Past Surgical History:  Past Surgical History:  Procedure Laterality Date   COLONOSCOPY  11/19/2009   RMR: 1. Multiple small rectal polyps status post snare polypectomy. 2. Multiple left colon polyp status post snare removal . Poor prep compromised exam.    COLONOSCOPY N/A 12/13/2014   Procedure: COLONOSCOPY;  Surgeon: Daneil Dolin, MD;  [facility];  Service: Endoscopy;  Laterality: N/A;  945   COLONOSCOPY N/A 07/18/2020   Procedure: COLONOSCOPY;  Surgeon: Daneil Dolin, MD;  Location: [facility];  Service: Endoscopy;  Laterality: N/A;  am   FLEXIBLE SIGMOIDOSCOPY N/A 04/25/2020   Procedure: FLEXIBLE SIGMOIDOSCOPY;  Surgeon: Daneil Dolin, MD; incomplete colonoscopy with flex sig only due to poor prep.   HERNIA REPAIR     POLYPECTOMY  07/18/2020   Procedure: POLYPECTOMY;  Surgeon: Daneil Dolin, MD;  Location: [facility];  Service: Endoscopy;;    Assessment & Plan Clinical Impression: Javier [name] is a 76 year old male who presented to the emergency department complaining of acute onset of imbalance while trying to stand on the morning of 09/25/2021. This was associated with dizziness, generalized weakness and nausea. EMS was called and the patient was able to ambulate to the stretcher. There was report of recent alcohol intake. BAC level negative. UDS  negative. CTH negative for acute hemorrhage. Physical exam significant for left upper and lower extremity ataxia. MRI positive for acute infarcts involving the left cerebellum extending into the dorsal and left midbrain; two small foci of acute infarction in the right cerebellum. Neurology consulted. No tPA as outside window. No LVO. Etiology likely atheroembolic versus cardioembolic. TEE done 09/21/2021. Aspirin 81 mg and Plavix 75 mg daily for 90 days followed by aspirin 81 mg daily. Recommend 30 day event monitor at discharge. Patient transferred to CIR on 10/01/2021 .    Patient currently requires mod with basic self-care skills secondary to muscle weakness, decreased cardiorespiratoy endurance, ataxia and decreased coordination, decreased visual motor skills, decreased attention, decreased awareness, decreased problem solving, decreased safety awareness, decreased memory, and delayed processing, and decreased sitting balance, decreased standing balance, decreased postural control, hemiplegia, and decreased balance strategies.  Prior to hospitalization, patient could complete ADLs with independent .  Patient will benefit from skilled intervention to increase independence with basic self-care skills prior to discharge home with care partner.  Anticipate patient will require 24 hour supervision and follow up home health.  OT - End of Session Activity Tolerance: Tolerates 30+ min activity with multiple rests Endurance Deficit: Yes Endurance Deficit Description: mild deconditioning OT Assessment Rehab Potential (ACUTE ONLY): Good OT Barriers to Discharge: Incontinence OT Patient demonstrates impairments in the following area(s): Balance;Safety;Cognition;Endurance;Vision;Motor OT Basic ADL's Functional Problem(s): Eating;Grooming;Dressing;Toileting;Bathing OT Transfers Functional Problem(s): Toilet;Tub/Shower OT Additional Impairment(s): Fuctional Use of Upper Extremity OT Plan OT Intensity: Minimum  of 1-2 x/day, 45 to 90 minutes OT Frequency: 5 out of 7  days OT Duration/Estimated Length of Stay: ~2.5 weeks OT Treatment/Interventions: Balance/vestibular training;Discharge planning;Functional electrical stimulation;Pain management;Self Care/advanced ADL retraining;Therapeutic Activities;UE/LE Coordination activities;Visual/perceptual remediation/compensation;Skin care/wound managment;Patient/family education;Functional mobility training;Cognitive remediation/compensation;Disease mangement/prevention;Therapeutic Exercise;Community reintegration;DME/adaptive equipment instruction;Neuromuscular re-education;Psychosocial support;Splinting/orthotics;UE/LE Strength taining/ROM;Wheelchair propulsion/positioning OT Self Feeding Anticipated Outcome(s): setup OT Basic Self-Care Anticipated Outcome(s): supervision OT Toileting Anticipated Outcome(s): supervision OT Bathroom Transfers Anticipated Outcome(s): supervision OT Recommendation Patient destination: Home Follow Up Recommendations: 24 hour supervision/assistance;Home health OT Equipment Recommended: To be determined   OT Evaluation Precautions/Restrictions  Precautions Precautions: Fall Restrictions Weight Bearing Restrictions: No General Chart Reviewed: Yes Pain Pain Assessment Pain Scale: 0-10 Pain Score: 0-No pain Home Living/Prior Functioning Home Living Available Help at Discharge: Family, Available 24 hours/day Type of Home: House Home Access: Stairs to enter Technical brewer of Steps: 2 Entrance Stairs-Rails: None Home Layout: One level  Lives With: Spouse IADL History Education: 8th grade Prior Function Level of Independence: Independent with gait, Independent with transfers  Able to Take Stairs?: Yes Driving: No Vocation: On disability Vision Baseline Vision/History: 1 Wears glasses;4 Cataracts Ability to See in Adequate Light: 0 Adequate Patient Visual Report: No change from baseline Vision Assessment?:  Yes Eye Alignment: Within Functional Limits Ocular Range of Motion: Within Functional Limits Alignment/Gaze Preference: Within Defined Limits Tracking/Visual Pursuits: Able to track stimulus in all quads without difficulty Perception  Perception: Within Functional Limits Praxis Praxis: Intact Cognition Cognition Overall Cognitive Status: Impaired/Different from baseline Arousal/Alertness: Awake/alert Orientation Level: Situation;Place;Person Person: Oriented Place: Oriented Situation: Oriented Memory: Impaired Memory Impairment: Retrieval deficit;Decreased short term memory;Decreased recall of new information Decreased Short Term Memory: Verbal basic;Functional basic Attention: Focused;Sustained;Selective Focused Attention: Appears intact Sustained Attention: Impaired Sustained Attention Impairment: Verbal basic;Functional basic Selective Attention: Impaired Selective Attention Impairment: Verbal basic;Functional basic Awareness: Impaired Awareness Impairment: Intellectual impairment Problem Solving: Impaired Problem Solving Impairment: Verbal basic;Functional basic Executive Function:  (lower level deficits impacting executive functions) Safety/Judgment: Impaired Brief Interview for Mental Status (BIMS) Repetition of Three Words (First Attempt): 3 Temporal Orientation: Year: Correct Temporal Orientation: Month: Accurate within 5 days Temporal Orientation: Day: Correct Recall: "Sock": No, could not recall Recall: "Blue": Yes, after cueing ("a color") Recall: "Bed": No, could not recall BIMS Summary Score: 10 Sensation Sensation Light Touch: Appears Intact Hot/Cold: Appears Intact Proprioception: Impaired Detail Proprioception Impaired Details: Impaired LUE;Impaired LLE Stereognosis: Not tested Coordination Gross Motor Movements are Fluid and Coordinated: No Fine Motor Movements are Fluid and Coordinated: No Finger Nose Finger Test: Left ataxic; RUE WNL Motor   Motor Motor: Ataxia Motor - Skilled Clinical Observations: Left hemi  Trunk/Postural Assessment  Cervical Assessment Cervical Assessment: Within Functional Limits Thoracic Assessment Thoracic Assessment: Exceptions to [facility] (rounded shoulders) Lumbar Assessment Lumbar Assessment:  (mild posterior pelvic tilt) Postural Control Postural Control: Deficits on evaluation Trunk Control: impaired Righting Reactions: delayed and inefficient Protective Responses: delayed and inefficient  Balance Balance Balance Assessed: Yes Static Sitting Balance Static Sitting - Balance Support: No upper extremity supported;Feet supported Static Sitting - Level of Assistance: 5: Stand by assistance Dynamic Sitting Balance Dynamic Sitting - Balance Support: During functional activity;Feet supported Dynamic Sitting - Level of Assistance: 5: Stand by assistance;4: Min assist Static Standing Balance Static Standing - Balance Support: Right upper extremity supported;During functional activity Static Standing - Level of Assistance: 4: Min assist Dynamic Standing Balance Dynamic Standing - Balance Support: During functional activity;Right upper extremity supported Dynamic Standing - Level of Assistance: 4: Min assist;3: Mod assist Extremity/Trunk Assessment RUE Assessment RUE Assessment: Within Functional Limits LUE Assessment LUE Assessment: Exceptions to  WFL General Strength Comments: 3+/5  Care Tool Care Tool Self Care Eating   Eating Assist Level: Supervision/Verbal cueing    Oral Care    Oral Care Assist Level: Supervision/Verbal cueing    Bathing         Assist Level: Moderate Assistance - Patient 50 - 74%    Upper Body Dressing(including orthotics)       Assist Level: Moderate Assistance - Patient 50 - 74%    Lower Body Dressing (excluding footwear)     Assist for lower body dressing: Moderate Assistance - Patient 50 - 74%    Putting on/Taking off footwear     Assist for  footwear: Moderate Assistance - Patient 50 - 74%       Care Tool Toileting Toileting activity   Assist for toileting: Moderate Assistance - Patient 50 - 74%     Care Tool Bed Mobility Roll left and right activity        Sit to lying activity        Lying to sitting on side of bed activity         Care Tool Transfers Sit to stand transfer        Chair/bed transfer         Toilet transfer   Assist Level: Moderate Assistance - Patient 50 - 74%     Care Tool Cognition  Expression of Ideas and Wants Expression of Ideas and Wants: 3. Some difficulty - exhibits some difficulty with expressing needs and ideas (e.g, some words or finishing thoughts) or speech is not clear  Understanding Verbal and Non-Verbal Content Understanding Verbal and Non-Verbal Content: 3. Usually understands - understands most conversations, but misses some part/intent of message. Requires cues at times to understand   Memory/Recall Ability Memory/Recall Ability : Current season;That he or she is in a hospital/hospital unit   Refer to Care Plan for [place] 1 OT Short Term Goal 1 (Week 1): Pt will complete stand pivot toilet transfer with supervision OT Short Term Goal 2 (Week 1): Pt will complete donn/doff pants with supervision OT Short Term Goal 3 (Week 1): Pt will bathe LB with CGA OT Short Term Goal 4 (Week 1): Pt will increase shoulder FF/abd to 4-/5 MMT  Recommendations for other services: None    Skilled Therapeutic Intervention ADL ADL Grooming: Supervision/safety Where Assessed-Grooming: Sitting at sink Upper Body Bathing: Minimal assistance Where Assessed-Upper Body Bathing: Sitting at sink Lower Body Bathing: Moderate assistance Where Assessed-Lower Body Bathing: Sitting at sink Upper Body Dressing: Moderate assistance Where Assessed-Upper Body Dressing: Sitting at sink Lower Body Dressing: Moderate assistance Where Assessed-Lower Body Dressing:  Standing at sink;Sitting at sink Toileting: Moderate assistance Toilet Transfer: Moderate assistance Toilet Transfer Method: Stand pivot Toilet Transfer Equipment: Bedside commode Mobility  Bed Mobility Bed Mobility: Supine to Sit Supine to Sit: Contact Guard/Touching assist Transfers Sit to Stand: Minimal Assistance - Patient > 75% Stand to Sit: Minimal Assistance - Patient > 75%   Discharge Criteria: Patient will be discharged from OT if patient refuses treatment 3 consecutive times without medical reason, if treatment goals not met, if there is a change in medical status, if patient makes no progress towards goals or if patient is discharged from hospital.  The above assessment, treatment plan, treatment alternatives and goals were discussed and mutually agreed upon: by patient  Ezekiel Slocumb 10/02/2021, 8:04 PM

## 2021-10-02 NOTE — Plan of Care (Signed)
  Problem: RH Balance Goal: LTG Patient will maintain dynamic standing with ADLs (OT) Description: LTG:  Patient will maintain dynamic standing balance with assist during activities of daily living (OT)  Flowsheets (Taken 10/02/2021 2014) LTG: Pt will maintain dynamic standing balance during ADLs with: Supervision/Verbal cueing   Problem: RH Grooming Goal: LTG Patient will perform grooming w/assist,cues/equip (OT) Description: LTG: Patient will perform grooming with assist, with/without cues using equipment (OT) Flowsheets (Taken 10/02/2021 2014) LTG: Pt will perform grooming with assistance level of: Supervision/Verbal cueing   Problem: RH Bathing Goal: LTG Patient will bathe all body parts with assist levels (OT) Description: LTG: Patient will bathe all body parts with assist levels (OT) Flowsheets (Taken 10/02/2021 2014) LTG: Pt will perform bathing with assistance level/cueing: Supervision/Verbal cueing LTG: Position pt will perform bathing: Shower   Problem: RH Dressing Goal: LTG Patient will perform upper body dressing (OT) Description: LTG Patient will perform upper body dressing with assist, with/without cues (OT). Flowsheets (Taken 10/02/2021 2014) LTG: Pt will perform upper body dressing with assistance level of: Set up assist Goal: LTG Patient will perform lower body dressing w/assist (OT) Description: LTG: Patient will perform lower body dressing with assist, with/without cues in positioning using equipment (OT) Flowsheets (Taken 10/02/2021 2014) LTG: Pt will perform lower body dressing with assistance level of: Supervision/Verbal cueing   Problem: RH Toileting Goal: LTG Patient will perform toileting task (3/3 steps) with assistance level (OT) Description: LTG: Patient will perform toileting task (3/3 steps) with assistance level (OT)  Flowsheets (Taken 10/02/2021 2014) LTG: Pt will perform toileting task (3/3 steps) with assistance level: Supervision/Verbal cueing    Problem: RH Toilet Transfers Goal: LTG Patient will perform toilet transfers w/assist (OT) Description: LTG: Patient will perform toilet transfers with assist, with/without cues using equipment (OT) Flowsheets (Taken 10/02/2021 2014) LTG: Pt will perform toilet transfers with assistance level of: Supervision/Verbal cueing   Problem: RH Tub/Shower Transfers Goal: LTG Patient will perform tub/shower transfers w/assist (OT) Description: LTG: Patient will perform tub/shower transfers with assist, with/without cues using equipment (OT) Flowsheets (Taken 10/02/2021 2014) LTG: Pt will perform tub/shower stall transfers with assistance level of: Supervision/Verbal cueing LTG: Pt will perform tub/shower transfers from: Tub/shower combination

## 2021-10-02 NOTE — Patient Care Conference (Signed)
Inpatient RehabilitationTeam Conference and Plan of Care Update Date: 10/02/2021   Time: 10:50 AM    Patient Name: Javier [name]      Medical Record Number: [id number]  Date of Birth: [birth year] Sex: Male         Room/Bed: 4W08C/4W08C-01 Payor Info: Payor: [employer] MEDICARE / Plan: [facility] MEDICARE / Product Type: *No Product type* /    Admit Date/Time:  10/01/2021  4:50 PM  Primary Diagnosis:  Cerebellar stroke [facility])  Hospital Problems: Principal Problem:   Cerebellar stroke [facility])    Expected Discharge Date: Expected Discharge Date:  (evals pending)  Team Members Present: Physician leading conference: Dr. Alysia Penna Social Worker Present: Erlene Quan, BSW Nurse Present: Dorien Chihuahua, RN PT Present: Page Spiro, PT OT Present: Willeen Cass, OT SLP Present: Sherren Kerns, SLP PPS Coordinator present : Gunnar Fusi, SLP     Current Status/Progress Goal Weekly Team Focus  Bowel/Bladder   patient currently using condom catheter at nighttime  to return to continent state without assistive devices.  to use urinal.   Swallow/Nutrition/ Hydration             ADL's   eval pending         Mobility   eval pending         Communication             Safety/Cognition/ Behavioral Observations  eval pending         Pain   no pain reported         Skin   no skin breakdown           Discharge Planning:      Team Discussion: Patient with ataxia on left side and right inattention post cerebellar infarct old left occ.infarct/right field cut possible.  Patient on target to meet rehab goals: Evals pending. Sit -stand with mod assist and toileting with mod - max assist.   *See Care Plan and progress notes for long and short-term goals.   Revisions to Treatment Plan:  N/A   Teaching Needs: Safety, medications, transfers, toileting, secondary risk management.  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, labile HTN  Barriers to Discharge: Medical stability   Possible Resolutions to Barriers/Weekly Focus: complete initial evals, manage HTN   Continued Need for Acute Rehabilitation Level of Care: The patient requires daily medical management by a physician with specialized training in physical medicine and rehabilitation for the following reasons: Direction of a multidisciplinary physical rehabilitation program to maximize functional independence : Yes Medical management of patient stability for increased activity during participation in an intensive rehabilitation regime.: Yes Analysis of laboratory values and/or radiology reports with any subsequent need for medication adjustment and/or medical intervention. : Yes   I attest that I was present, lead the team conference, and concur with the assessment and plan of the team.   Dorien Chihuahua B 10/02/2021, 3:36 PM

## 2021-10-02 NOTE — Progress Notes (Signed)
Inpatient Rehabilitation  Patient information reviewed and entered into eRehab system by Adelynn Gipe Roczen Waymire, OTR/L.   Information including medical coding, functional ability and quality indicators will be reviewed and updated through discharge.    

## 2021-10-02 NOTE — Plan of Care (Signed)
  Problem: RH Swallowing Goal: LTG Patient will consume least restrictive diet using compensatory strategies with assistance (SLP) Description: LTG:  Patient will consume least restrictive diet using compensatory strategies with assistance (SLP) Flowsheets (Taken 10/02/2021 1702) LTG: Pt Patient will consume least restrictive diet using compensatory strategies with assistance of (SLP): Supervision Goal: LTG Pt will demonstrate functional change in swallow as evidenced by bedside/clinical objective assessment (SLP) Description: LTG: Patient will demonstrate functional change in swallow as evidenced by bedside/clinical objective assessment (SLP) Flowsheets (Taken 10/02/2021 1702) LTG: Patient will demonstrate functional change in swallow as evidenced by bedside/clinical objective assessment: Oropharyngeal swallow   Problem: RH Problem Solving Goal: LTG Patient will demonstrate problem solving for (SLP) Description: LTG:  Patient will demonstrate problem solving for basic/complex daily situations with cues  (SLP) Flowsheets (Taken 10/02/2021 1702) LTG: Patient will demonstrate problem solving for (SLP): Basic daily situations LTG Patient will demonstrate problem solving for: Minimal Assistance - Patient > 75%   Problem: RH Memory Goal: LTG Patient will use memory compensatory aids to (SLP) Description: LTG:  Patient will use memory compensatory aids to recall biographical/new, daily complex information with cues (SLP) Flowsheets (Taken 10/02/2021 1702) LTG: Patient will use memory compensatory aids to (SLP): Minimal Assistance - Patient > 75%   Problem: RH Attention Goal: LTG Patient will demonstrate this level of attention during functional activites (SLP) Description: LTG:  Patient will will demonstrate this level of attention during functional activites (SLP) Flowsheets (Taken 10/02/2021 1702) Patient will demonstrate during cognitive/linguistic activities the attention type of:  Sustained Patient will demonstrate this level of attention during cognitive/linguistic activities in: Controlled LTG: Patient will demonstrate this level of attention during cognitive/linguistic activities with assistance of (SLP): Supervision   Problem: RH Awareness Goal: LTG: Patient will demonstrate awareness during functional activites type of (SLP) Description: LTG: Patient will demonstrate awareness during functional activites type of (SLP) Flowsheets (Taken 10/02/2021 1702) Patient will demonstrate during cognitive/linguistic activities awareness type of: Intellectual LTG: Patient will demonstrate awareness during cognitive/linguistic activities with assistance of (SLP): Supervision

## 2021-10-02 NOTE — Plan of Care (Signed)
  Problem: RH Balance Goal: LTG Patient will maintain dynamic sitting balance (PT) Description: LTG:  Patient will maintain dynamic sitting balance with assistance during mobility activities (PT) Flowsheets (Taken 10/02/2021 2221) LTG: Pt will maintain dynamic sitting balance during mobility activities with:: Supervision/Verbal cueing Goal: LTG Patient will maintain dynamic standing balance (PT) Description: LTG:  Patient will maintain dynamic standing balance with assistance during mobility activities (PT) Flowsheets (Taken 10/02/2021 2221) LTG: Pt will maintain dynamic standing balance during mobility activities with:: Contact Guard/Touching assist   Problem: Sit to Stand Goal: LTG:  Patient will perform sit to stand with assistance level (PT) Description: LTG:  Patient will perform sit to stand with assistance level (PT) Flowsheets (Taken 10/02/2021 2221) LTG: PT will perform sit to stand in preparation for functional mobility with assistance level: Supervision/Verbal cueing   Problem: RH Bed Mobility Goal: LTG Patient will perform bed mobility with assist (PT) Description: LTG: Patient will perform bed mobility with assistance, with/without cues (PT). Flowsheets (Taken 10/02/2021 2221) LTG: Pt will perform bed mobility with assistance level of: Supervision/Verbal cueing   Problem: RH Bed to Chair Transfers Goal: LTG Patient will perform bed/chair transfers w/assist (PT) Description: LTG: Patient will perform bed to chair transfers with assistance (PT). Flowsheets (Taken 10/02/2021 2221) LTG: Pt will perform Bed to Chair Transfers with assistance level: Supervision/Verbal cueing   Problem: RH Car Transfers Goal: LTG Patient will perform car transfers with assist (PT) Description: LTG: Patient will perform car transfers with assistance (PT). Flowsheets (Taken 10/02/2021 2221) LTG: Pt will perform car transfers with assist:: Supervision/Verbal cueing   Problem: RH Ambulation Goal: LTG  Patient will ambulate in controlled environment (PT) Description: LTG: Patient will ambulate in a controlled environment, # of feet with assistance (PT). Flowsheets (Taken 10/02/2021 2221) LTG: Pt will ambulate in controlled environ  assist needed:: Supervision/Verbal cueing LTG: Ambulation distance in controlled environment: 18f using LRAD Goal: LTG Patient will ambulate in home environment (PT) Description: LTG: Patient will ambulate in home environment, # of feet with assistance (PT). Flowsheets (Taken 10/02/2021 2221) LTG: Pt will ambulate in home environ  assist needed:: Supervision/Verbal cueing LTG: Ambulation distance in home environment: 58fusing LRAD   Problem: RH Stairs Goal: LTG Patient will ambulate up and down stairs w/assist (PT) Description: LTG: Patient will ambulate up and down # of stairs with assistance (PT) Flowsheets (Taken 10/02/2021 2221) LTG: Pt will ambulate up/down stairs assist needed:: Contact Guard/Touching assist LTG: Pt will  ambulate up and down number of stairs: at least 2 steps using HRs per home set-up

## 2021-10-03 DIAGNOSIS — I10 Essential (primary) hypertension: Secondary | ICD-10-CM | POA: Diagnosis not present

## 2021-10-03 DIAGNOSIS — R1310 Dysphagia, unspecified: Secondary | ICD-10-CM

## 2021-10-03 DIAGNOSIS — D649 Anemia, unspecified: Secondary | ICD-10-CM | POA: Diagnosis not present

## 2021-10-03 DIAGNOSIS — I639 Cerebral infarction, unspecified: Secondary | ICD-10-CM | POA: Diagnosis not present

## 2021-10-03 NOTE — Progress Notes (Signed)
PROGRESS NOTE   Subjective/Complaints:  He is asking about nursing plan to remove IV. No additional concerns.   ROS- limited by cognition , denies pain or SOB. No fevers or chills.   Objective:   No results found. Recent Labs    10/01/21 0437 10/02/21 0540  WBC 5.3 5.7  HGB 10.8* 11.5*  HCT 32.9* 34.4*  PLT 220 240    Recent Labs    10/01/21 0437 10/02/21 0540  NA 137 137  K 3.2* 3.8  CL 103 103  CO2 27 26  GLUCOSE 101* 91  BUN 11 11  CREATININE 0.73 0.83  CALCIUM 8.2* 8.7*     Intake/Output Summary (Last 24 hours) at 10/03/2021 0725 Last data filed at 10/03/2021 0457 Gross per 24 hour  Intake 410 ml  Output 1075 ml  Net -665 ml         Physical Exam: Vital Signs Blood pressure (!) 160/81, pulse (!) 51, temperature 98.6 F (37 C), temperature source Oral, resp. rate 16, SpO2 94 %.  EENT- right lens opacified  General: No acute distress, in bed, Mood and affect are appropriate Heart: Regular rate and rhythm no rubs murmurs or extra sounds Lungs: Clear to auscultation, breathing unlabored, no rales or wheezes Abdomen: Positive bowel sounds, soft nontender to palpation, nondistended Extremities: No clubbing, cyanosis, or edema Skin: No evidence of breakdown, no evidence of rash Neurologic: EOMI, no ptosis , motor strength is 5/5 in bilateral deltoid, bicep, tricep, grip, hip flexor, knee extensors, ankle dorsiflexor and plantar flexor Sensory exam normal sensation to light touch and proprioception in bilateral upper and lower extremities Perseverating regarding IV Cerebellar exam moderate ataxia Left Finger nose finger  Musculoskeletal: Full range of motion in all 4 extremities. No joint swelling PIC RUE  Assessment/Plan: 1. Functional deficits which require 3+ hours per day of interdisciplinary therapy in a comprehensive inpatient rehab setting. Physiatrist is providing close team supervision and  24 hour management of active medical problems listed below. Physiatrist and rehab team continue to assess barriers to discharge/monitor patient progress toward functional and medical goals  Care Tool:  Bathing              Bathing assist Assist Level: Moderate Assistance - Patient 50 - 74%     Upper Body Dressing/Undressing Upper body dressing   What is the patient wearing?: Hospital gown only    Upper body assist Assist Level: Moderate Assistance - Patient 50 - 74%    Lower Body Dressing/Undressing Lower body dressing      What is the patient wearing?: Incontinence brief     Lower body assist Assist for lower body dressing: Moderate Assistance - Patient 50 - 74%     Toileting Toileting    Toileting assist Assist for toileting: Moderate Assistance - Patient 50 - 74%     Transfers Chair/bed transfer  Transfers assist     Chair/bed transfer assist level: Moderate Assistance - Patient 50 - 74%     Locomotion Ambulation   Ambulation assist   Ambulation activity did not occur: Safety/medical concerns (Pt did not feel safe attempting gait unless using RW, which he did not use at baseline)  Walk 10 feet activity   Assist  Walk 10 feet activity did not occur: Safety/medical concerns        Walk 50 feet activity   Assist Walk 50 feet with 2 turns activity did not occur: Safety/medical concerns         Walk 150 feet activity   Assist Walk 150 feet activity did not occur: Safety/medical concerns         Walk 10 feet on uneven surface  activity   Assist Walk 10 feet on uneven surfaces activity did not occur: Safety/medical concerns         Wheelchair     Assist Is the patient using a wheelchair?: Yes (for transport to/from gym)      Wheelchair assist level: Dependent - Patient 0%      Wheelchair 50 feet with 2 turns activity    Assist        Assist Level: Dependent - Patient 0%   Wheelchair 150 feet  activity     Assist      Assist Level: Dependent - Patient 0%   Blood pressure (!) 160/81, pulse (!) 51, temperature 98.6 F (37 C), temperature source Oral, resp. rate 16, SpO2 94 %.  Medical Problem List and Plan: 1. 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 Left cerebellar infarct extends into dorsal left midbrain onset 09/25/21.             -patient may  shower             -ELOS/Goals: 12-14 days, supervision goals with PT, OT, SLP  - Continue CIR PT/OT/SLP 2.  Antithrombotics: -DVT/anticoagulation:  Pharmaceutical: Lovenox             -antiplatelet therapy: Plavix and aspirin for 3 months then aspirin alone 3. Pain Management: Tylenol as needed 4. Mood: LCSW to evaluate and provide emotional support             -antipsychotic agents: n/a 5. Neuropsych: This patient is capable of making decisions on his own behalf. 6. Skin/Wound Care: Routine skin care checks 7. Fluids/Electrolytes/Nutrition: Routine Is and Os and follow-up chemistries             -dys 3 diet with nectar; crush meds 8: Dysphagia; continue dysphagia 3 diet with nectar thick liquids             --Continue SLP, currently Dys3 with Nectar thick liquids, he is eating most meals  -Recheck BMP labs tomorrow, advise fluids intake  9: Hypertension: continue metoprolol 50 mg daily             --home Catapres and Benicar held and not restarted Vitals:   10/02/21 1932 10/03/21 0419  BP: (!) 150/79 (!) 160/81  Pulse: 65 (!) 51  Resp: 15 16  Temp: 98.8 F (37.1 C) 98.6 F (37 C)  SpO2: 94% 94%  -A little evaluated but stable, continue permissive HTN with plan to gradually lower 10: Hyperlipidemia: continue Crestor 20 mg daily             --home Zetia and Repatha held not restarted 11: Prediabetes: random serum glucoses and CBGs within range --follow-up/monitor with PCP 12: GERD: continue Protonix 40 mg daily 13: Hypokalemia: recheck BMP improved , monitor weekly              -has  received supplement 14: Anemia: not symptomatic; follow-up CBC 6/1 improved to 11.5    Latest Ref Rng & Units 10/02/2021  5:40 AM 10/01/2021    4:37 AM 09/25/2021    4:00 AM  CBC  WBC 4.0 - 10.5 K/uL 5.7   5.3   6.1    Hemoglobin 13.0 - 17.0 g/dL 11.5   10.8   12.8    Hematocrit 39.0 - 52.0 % 34.4   32.9   39.4    Platelets 150 - 400 K/uL 240   220   208      15. Episodes of SVT--responded to IV metoprolol x 1 dose             -f/u EKG confirmed NSR             -continue metoprolol '50mg'$  daily as bove.      LOS: 2 days A FACE TO FACE EVALUATION WAS PERFORMED  Jennye Boroughs 10/03/2021, 7:25 AM

## 2021-10-03 NOTE — Progress Notes (Signed)
Physical Therapy Session Note  Patient Details  Name: Javier [name] MRN: [id number] Date of Birth: May 25, 1945  Today's Date: 10/03/2021 PT Individual Time: 1020-1105 and 1650-1730 PT Individual Time Calculation (min): 45 min and 40 min  Short Term Goals: Week 1:  PT Short Term Goal 1 (Week 1): Pt will perform supine<>sit with CGA consistently PT Short Term Goal 2 (Week 1): Pt will perform sit<>stands using LRAD with CGA consistently PT Short Term Goal 3 (Week 1): Pt will perform bed<>chair transfers using LRAD with CGA consistently PT Short Term Goal 4 (Week 1): Pt will ambulate at least 164f using LRAD with CGA PT Short Term Goal 5 (Week 1): Pt will navigate 2 steps per home set-up with min assist  Skilled Therapeutic Interventions/Progress Updates:   Session 1: Pt received sitting in w/c and agreeable to therapy session.  Transported to/from gym in w/c for time management and energy conservation.  Sit<>stands to RW with min assist for balance due to overall truncal ataxia - max cuing for safe hand placement using AD.   Gait training ~1532fusing RW with min assist for balance - demos excessive forward trunk flexion with L lean bias, heavy L heel landing on initial contact, and overall UE/body ataxia with quick back and forth movements of AD - towards end, benefits from therapist controlling forward movement of AD and cuing for upright posture due worsening forward lean.  Donned tennis shoes. Gait training additional 1509fsing RW with continued min assist for balance and AD management as described above with improvement noticed - cuing for R weight shift onto R stance limb to allow improved L LE swing phase advancement.   Gait training ~120f16fing B HHA (L UE over therapist's shoulder and R HHA from +2) with +2 light mod assist for balance as pt with significant fear of falling - demos improved upright posture but increased truncal ataxia and L LE ataxia especially when turning to  sit in w/c with overshooting backwards step - pt demos wider BOS throughout as well.  Side stepping in // bars x2 B HHA (L UE around therapist's shoulders and R HHA) to challenge balance with pt continuing to have hesitation of movement without UE support on AD - uncoordinated stepping with more impairment noted going towards L resulting in smaller step lengths - mod assist for balance throughout.  Static standing in // bars without UE support for 30 seconds - demos mild anterior/posterior postural sway with this.   Transported back to room and pt agreeable to remain up in w/c - left with needs in reach, seat belt alarm on, and lines intact.  Session 2: Pt received supine in bed and agreeable to therapy session. Pt continues to have episodes of coughing and spitting out saliva. Supine>sitting L EOB, HOB partially elevated and using bedrail, with close supervision. Donned tennis shoes total assist for time management. R stand pivot EOB>w/c using RW with light min assist for balance - cuing for safe hand placement when using AD - continues to have L LE ataxia with overshooting when stepping backwards to seat and increased overall truncal ataxia. Transported outside in w/c. Sit<>stands using RW with light min assist progressing to CGA during session - continued cuing for hand placement during transfer as pt tends to want to pull up with both hands on AD. Gait training ~50ft32f100ft 30fsing RW outside on paved concrete with min assist for balance - excessive anterior trunk flexion with increased WBing down through AD,  truncal ataxia, L UE ataxia noted by unsteadiness of AD - cuing for improvement. When turning pt tends to take large steps and move walker quickly through a large radius requiring cuing to perform smaller turns to maintain balance and increased assist to maintain balance. Transported back up to room and left seated in w/c with needs in reach, lines intact, seat belt alarm on, and NT present with  meal tray.    Therapy Documentation Precautions:  Precautions Precautions: Fall, Other (comment) Precaution Comments: HOH, dysphagia diet, L hemibody ataxia Restrictions Weight Bearing Restrictions: No   Pain: Session 1: No reports of pain throughout session.  Session 2: No reports of pain throughout session.    Therapy/Group: Individual Therapy  Tawana Scale , PT, DPT, NCS, CSRS 10/03/2021, 7:58 AM

## 2021-10-03 NOTE — Progress Notes (Signed)
Occupational Therapy Session Note  Patient Details  Name: Javier [name] MRN: [id number] Date of Birth: Sep 24, 1945  Today's Date: 10/03/2021 OT Individual Time: 0905-1000 OT Individual Time Calculation (min): 55 min    Short Term Goals: Week 1:  OT Short Term Goal 1 (Week 1): Pt will complete stand pivot toilet transfer with supervision OT Short Term Goal 2 (Week 1): Pt will complete donn/doff pants with supervision OT Short Term Goal 3 (Week 1): Pt will bathe LB with CGA OT Short Term Goal 4 (Week 1): Pt will increase shoulder FF/abd to 4-/5 MMT  Skilled Therapeutic Interventions/Progress Updates:    S: Stated his birthday was [birth year] first today [birth year].   O: Grooming: seated at sink with set-up. Patient used washcloth to wash face. UB bathing: seated at sink with set-up. VC to initiate task. Patient completed with supervision. UB dressing: Seated at sink: Required Min assist with VC for sequencing. LB dressing: Completed with Mod Assist. Required assist with condom cath management. Pt able to stand with contact guard assist to pull pants up over hips. Able to pull pants up 50% and required assist to complete task. LB bathing: Completed with Mod assist. Unable to reach lower legs and feet.  A/ROM: bilateral shoulders, seated, flexion, protraction, horizontal abduction/adduction, IR/er 10X  A: Reviewed therapy goals with patient. Patient selected to complete bathing and dressing seated at sink. Patient demonstrates increased difficulty using his left UE to complete self care tasks due to lack of motor coordination. Provided VC for technique, sequencing, and safety during ADL. Patient completed A/ROM BUE exercises with difficulty achieving full ROM using his LUE. Exercises were modified to accommodate lack of strength and ROM.     P: Continue working on increasing his LUE functional use and coordination in order to use bilateral UE's to complete bathing and dressing  tasks.   Therapy Documentation Precautions:  Precautions Precautions: Fall, Other (comment) Precaution Comments: HOH, dysphagia diet, L hemibody ataxia Restrictions Weight Bearing Restrictions: No al Signs:  Pain: Pt reports right arm soreness from removal of IV. Was not able to provide number score.   Therapy/Group: Individual Therapy  Ailene Ravel, OTR/L,CBIS  Supplemental OT - [facility]  10/03/2021, 9:46 AM

## 2021-10-03 NOTE — Progress Notes (Signed)
Pt rested well through out the night. No signs of distress noted, denies being in pain. Call light in reach.

## 2021-10-03 NOTE — Progress Notes (Signed)
Speech Language Pathology Daily Session Note  Patient Details  Name: Javier [name] MRN: [id number] Date of Birth: Oct 02, 1945  Today's Date: 10/03/2021 SLP Individual Time: 1230-1330 SLP Individual Time Calculation (min): 60 min  Short Term Goals: Week 1: SLP Short Term Goal 1 (Week 1): Pt will demonstrate selective attention to functional tasks for 10 minute duration with mod A verbal redirection SLP Short Term Goal 2 (Week 1): Pt will recall novel information with mod A for use of compensatory memory strategies to achieve 50% accuracy SLP Short Term Goal 3 (Week 1): Pt will complete basic problem solving with mod A verbal/visual cues to achieve 50% accuracy SLP Short Term Goal 4 (Week 1): Pt will demonstrate inellectual awareness of deficits by verbalizing at least 1 physical and 1 cognitive impairment/change s/p CVA with moderate verbal question/contextual cues SLP Short Term Goal 5 (Week 1): Patient will consume current diet with minimal overt s/sx of aspiration with min A for implementation of safe swallowing precautions/strategies SLP Short Term Goal 6 (Week 1): Pt will consume thin liquids with minimal overt s/sx of aspiration and min A cues to take small/single sips across 2-3 tx sessions prior to advancing liquids  Skilled Therapeutic Interventions: Skilled ST treatment focused on swallowing and cognitive goals. Patient seen for consumption of noon meal. Of note, pt exhibited dry cough prior to consumption of PO intake. Pt consumed dys 3 textures with mildly prolonged yet effective mastication, min oral residuals, and no overt s/sx of aspiration with meal. Pt required min A verbal cues to slow rate and to implement small bites/sips. SLP recommends continuation of current diet.   Pt required max A to participate in discussion re: intellectual awareness of deficits s/p CVA given contextual and question cues. SLP provided general stroke education. SLP provided max A verbal cues for  generating solutions to hypothetical home safety problem solving scenarios. Pt consistently responding "I don't know" and required max encouragement to persist to task. SLP provided max A for problem solving for use of call bell in room. SLP initiated medication management task as pt reported he was responsible for medications at prior level, however when presented pt with practice prescriptions he stated his wife managed medications. Pt with various inconsistencies regarding PLOF throughout session. Pt did report he had difficulty reading at baseline due to education level. Patient was left in wheelchair with alarm activated and immediate needs within reach at end of session. Continue per current plan of care.      Pain Pain Assessment Pain Scale: 0-10 Pain Score: 0-No pain  Therapy/Group: Individual Therapy  Patty Sermons 10/03/2021, 12:39 PM

## 2021-10-03 NOTE — Progress Notes (Addendum)
Inpatient Rehabilitation Care Coordinator Assessment and Plan Patient Details  Name: Javier [name] MRN: [id number] Date of Birth: [birth year]  Today's Date: 10/03/2021  Hospital Problems: Principal Problem:   Cerebellar stroke [facility])  Past Medical History:  Past Medical History:  Diagnosis Date   Cataract    HLD (hyperlipidemia)    Hypertension    Past Surgical History:  Past Surgical History:  Procedure Laterality Date   COLONOSCOPY  11/19/2009   RMR: 1. Multiple small rectal polyps status post snare polypectomy. 2. Multiple left colon polyp status post snare removal . Poor prep compromised exam.    COLONOSCOPY N/A 12/13/2014   Procedure: COLONOSCOPY;  Surgeon: Daneil Dolin, MD;  [facility]: [facility];  Service: Endoscopy;  Laterality: N/A;  945   COLONOSCOPY N/A 07/18/2020   Procedure: COLONOSCOPY;  Surgeon: Daneil Dolin, MD;  Location: [facility];  Service: Endoscopy;  Laterality: N/A;  am   FLEXIBLE SIGMOIDOSCOPY N/A 04/25/2020   Procedure: FLEXIBLE SIGMOIDOSCOPY;  Surgeon: Daneil Dolin, MD; incomplete colonoscopy with flex sig only due to poor prep.   HERNIA REPAIR     POLYPECTOMY  07/18/2020   Procedure: POLYPECTOMY;  Surgeon: Daneil Dolin, MD;  Location: [facility];  Service: Endoscopy;;   Social History:  reports that he has quit smoking. He has never used smokeless tobacco. He reports current alcohol use. He reports that he does not use drugs.  Family / Support Systems Spouse/Significant Other: Viola  Social History Preferred language: English Religion: None     Abuse/Neglect    Patient response to: Social Isolation - How often do you feel lonely or isolated from those around you?: Never  Emotional Status    Patient / Family Perceptions, Expectations & Goals Premorbid pt/family roles/activities: patient went out for appointments  Community Resources Premorbid Home Care/DME Agencies: Other (Comment) (RW) Transportation available at  discharge: spouse able to transport Is the patient able to respond to transportation needs?: Yes In the past 12 months, has lack of transportation kept you from meetings, work, or from getting things needed for daily living?: No  Discharge Planning Living Arrangements: Spouse/significant other Support Systems: Spouse/significant other Type of Residence: Private residence Insurance Resources: Multimedia programmer (specify), Medicaid (specify county) (UHC MEDICARE) Museum/gallery curator Resources: Fish farm manager, Family Support Financial Screen Referred: No Living Expenses: Education officer, community Management: Patient, Spouse Does the patient have any problems obtaining your medications?: No Home Management: Previously independent Patient/Family Preliminary Plans: spouse anle to assist with managment Care Coordinator Barriers to Discharge: Decreased caregiver support, Lack of/limited family support, Insurance for SNF coverage Care Coordinator Anticipated Follow Up Needs: HH/OP Expected length of stay: 12-14 Days  Clinical Impression Sw met with patient introduced self and explained role. Patient received a phone call, SW will follow up  [name] 10/03/2021, 12:41 PM

## 2021-10-03 NOTE — Progress Notes (Signed)
Patient ID: Javier [name], male   DOB: 1945-12-02, 76 y.o.   MRN: [id number] Met with the patient to review rehab process, team conference and plan of care. Patient preoccupied with discomfort from [place], nurse removed catheter. Continues with condom catheter; patient reported he wanted to keep on until moving around better, unable to use a urinal and aware he should not get up solo. Patient continent of large BM. Aware of medications used PTA; reviewed HTN and HLD. Discussed DAPT x 3 months then ASA solo. Reported wife managed meals; information on [facility] diet and prediabetes left for wife. Continue to follow along to discharge to address educational needs to facilitate preparation for discharge. Margarito Liner

## 2021-10-04 DIAGNOSIS — R1310 Dysphagia, unspecified: Secondary | ICD-10-CM | POA: Diagnosis not present

## 2021-10-04 DIAGNOSIS — R7303 Prediabetes: Secondary | ICD-10-CM | POA: Diagnosis not present

## 2021-10-04 DIAGNOSIS — I10 Essential (primary) hypertension: Secondary | ICD-10-CM | POA: Diagnosis not present

## 2021-10-04 DIAGNOSIS — I639 Cerebral infarction, unspecified: Secondary | ICD-10-CM | POA: Diagnosis not present

## 2021-10-04 LAB — BASIC METABOLIC PANEL
Anion gap: 6 (ref 5–15)
BUN: 11 mg/dL (ref 8–23)
CO2: 30 mmol/L (ref 22–32)
Calcium: 8.8 mg/dL — ABNORMAL LOW (ref 8.9–10.3)
Chloride: 100 mmol/L (ref 98–111)
Creatinine, Ser: 0.96 mg/dL (ref 0.61–1.24)
GFR, Estimated: >60 mL/min (ref >60)
Glucose, Bld: 103 mg/dL — ABNORMAL HIGH (ref 70–99)
Potassium: 4.2 mmol/L (ref 3.5–5.1)
Sodium: 136 mmol/L (ref 135–145)

## 2021-10-04 MED ORDER — IRBESARTAN 75 MG PO TABS
37.5000 mg | ORAL_TABLET | Freq: Every day | ORAL | Status: DC
  Administered 2021-10-04 – 2021-10-09 (×6): 37.5 mg via ORAL
  Filled 2021-10-04 (×7): qty 1

## 2021-10-04 NOTE — Progress Notes (Signed)
Physical Therapy Session Note  Patient Details  Name: Javier [name] MRN: [id number] Date of Birth: 1945/09/06  Today's Date: 10/04/2021 PT Individual Time: 0805-0901 PT Individual Time Calculation (min): 56 min   Short Term Goals: Week 1:  PT Short Term Goal 1 (Week 1): Pt will perform supine<>sit with CGA consistently PT Short Term Goal 2 (Week 1): Pt will perform sit<>stands using LRAD with CGA consistently PT Short Term Goal 3 (Week 1): Pt will perform bed<>chair transfers using LRAD with CGA consistently PT Short Term Goal 4 (Week 1): Pt will ambulate at least 174f using LRAD with CGA PT Short Term Goal 5 (Week 1): Pt will navigate 2 steps per home set-up with min assist  Skilled Therapeutic Interventions/Progress Updates:  Patient supine in bed on entrance to room. Patient alert and agreeable to PT session. Asks if catheter is on correctly. Redirected to move to bathroom to perform assessment and toileting altogether  Patient with no pain complaint throughout session.  Therapeutic Activity: Bed Mobility: Patient performed supine --> sit with supervision and therapist management of catheter. VC/ tc required for maintaining seated position on EOB and decreasing impulse to stand.  Transfers: Patient performed sit<>stand and stand pivot transfers throughout session with CGA/ intermittent MinA for balance. Provided verbal cues for scooting forward to edge of seat, foot placement, forward lean and push from seated surface as pt continually reaches out for RW. Toilet transfer performed with MinA/ CGA and MinA for clothing mgmt and MaxA for pericare.  Pt guided in stair training with physical demonstration and verbal instructions provided prior to performance. Pt is able to complete eight 6" steps using BHR with CGA/ MinA to ascend leading with RLE and CGA to descend leading with LLE. VC provided at initiation of each direction for leading LE and for advancement of L hand on  descent.  Neuromuscular Re-ed: NMR facilitated during session with focus on standing balance, coordination. Pt guided in standing toe touches to step. Is able to follow instructions for called LE and to first or second step. Decreased accuracy with ataxic movements on LLE but does touch correct step. Progressed to short blocked practice of sit<>stands with no AD and UE only to therapist's arms if needed. Is able to descend to sit with no UE support and slow return 1x out of 5 attempts d/t fear of loss of balance.   Also guided in controlled LE movements in toe touches to colored dots on floor in star pattern. Increased ataxic movements on LLE that improved throughout practice. Pt demos good stability in L knee when performing with RLE.   Standing reach to L with LUE to grab bean bag and able to maintain standing balance to far anterior reach to match to color disk on unlocked tray table. No LOB and definite ataxic movements with LUE but matching to correct color throughout. NMR performed for improvements in motor control and coordination, balance, sequencing, judgement, and self confidence/ efficacy in performing all aspects of mobility at highest level of independence.   Gait Training:  Patient ambulated >200 ft using RW with CGA/ MinA for balance and management of RW with increased ataxia in LUE. Provided vc/ tc for maintaining good step height on LLE throughout and upright posture.  Patient seated upright  in w/c at end of session with brakes locked, belt alarm set, and all needs within reach. NT present to assist with breakfast supervision.    Therapy Documentation Precautions:  Precautions Precautions: Fall, Other (comment) Precaution Comments:  HOH, dysphagia diet, L hemibody ataxia Restrictions Weight Bearing Restrictions: No General:   Vital Signs:   Pain: Pain Assessment Pain Scale: 0-10 Pain Score: 0-No pain  Therapy/Group: Individual Therapy  Alger Simons PT,  DPT 10/04/2021, 10:06 AM

## 2021-10-04 NOTE — IPOC Note (Signed)
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[facility])  Hospital Problems: Principal Problem:   Cerebellar stroke ([place])     Functional Problem List: Nursing Endurance, Safety, Medication Management  PT Balance, Motor, Safety, Behavior, Nutrition, Sensory, Edema, Pain, Skin Integrity, Endurance, Perception  OT Balance, Safety, Cognition, Endurance, Vision, Motor  SLP Cognition, Safety  TR         Basic ADL's: OT Eating, Grooming, Dressing, Toileting, Bathing     Advanced  ADL's: OT       Transfers: PT Bed Mobility, Bed to Chair, Car, Manufacturing systems engineer, Metallurgist: PT Ambulation, Stairs, Emergency planning/management officer     Additional Impairments: OT Fuctional Use of Upper Extremity  SLP Swallowing, Communication, Social Cognition comprehension Social Interaction, Problem Solving, Memory, Attention, Awareness  TR      Anticipated Outcomes Item Anticipated Outcome  Self Feeding setup  Swallowing  sup A   Basic self-care  supervision  Toileting  supervision   Bathroom Transfers supervision  Bowel/Bladder  N/A  Transfers  supervision using LRAD  Locomotion  supervision using LRAD  Communication     Cognition  min A  Pain  N/A  Safety/Judgment      Therapy Plan: PT Intensity: Minimum of 1-2 x/day ,45 to 90 minutes PT Frequency: 5 out of 7 days PT Duration Estimated Length of Stay: ~2-2.5 weeks OT Intensity: Minimum of 1-2 x/day, 45 to 90 minutes OT Frequency: 5 out of 7 days OT Duration/Estimated Length of Stay: ~2.5 weeks SLP Intensity: Minumum of 1-2 x/day, 30 to 90 minutes SLP Frequency: 3 to 5 out of 7 days SLP Duration/Estimated Length of Stay: 2-2.5 weeks   Team Interventions: Nursing Interventions Patient/Family Education, Dysphagia/Aspiration Development worker, community, Discharge Planning, Medication Management, Disease Management/Prevention  PT  interventions Ambulation/gait training, Training and development officer, Cognitive remediation/compensation, Community reintegration, Discharge planning, Disease management/prevention, DME/adaptive equipment instruction, Functional electrical stimulation, Functional mobility training, Neuromuscular re-education, Pain management, Patient/family education, Psychosocial support, Skin care/wound management, Stair training, Therapeutic Activities, Therapeutic Exercise, UE/LE Strength taining/ROM, UE/LE Coordination activities, Visual/perceptual remediation/compensation, Wheelchair propulsion/positioning  OT Interventions Balance/vestibular training, Discharge planning, Functional electrical stimulation, Pain management, Self Care/advanced ADL retraining, Therapeutic Activities, UE/LE Coordination activities, Visual/perceptual remediation/compensation, Skin care/wound managment, Patient/family education, Functional mobility training, Cognitive remediation/compensation, Disease mangement/prevention, Therapeutic Exercise, Community reintegration, Engineer, drilling, Neuromuscular re-education, Psychosocial support, Splinting/orthotics, UE/LE Strength taining/ROM, Wheelchair propulsion/positioning  SLP Interventions Cognitive remediation/compensation, Environmental controls, Internal/external aids, Dysphagia/aspiration precaution training, Functional tasks, Therapeutic Activities, Patient/family education  TR Interventions    SW/CM Interventions Discharge Planning, Psychosocial Support, Patient/Family Education, Disease Management/Prevention   Barriers to Discharge MD  Medical stability and Home enviroment access/loayout  Nursing Decreased caregiver support, Home environment access/layout 1 level 2 ste w spouse; has RW  PT Home environment access/layout, Nutrition means    OT Incontinence    SLP Other (comments) none identified at eval however pt provided incomplete history  SW Decreased caregiver  support, Lack of/limited family support, Insurance underwriter for SNF coverage     Team Discharge Planning: Destination: PT-Home ,OT- Home , SLP-Home Projected Follow-up: PT-Home health PT, 24 hour supervision/assistance, OT-  24 hour supervision/assistance, Home health OT, SLP-24 hour supervision/assistance, Home Health SLP, Outpatient SLP Projected Equipment Needs: PT-To be determined, OT- To be determined, SLP-To be determined Equipment Details: PT- , OT-  Patient/family involved in discharge planning: PT- Patient,  OT-Patient, SLP-Patient  MD ELOS: 12-14 Medical Rehab Prognosis:  Excellent  Assessment: The patient has been admitted for CIR therapies with the diagnosis of acute left > right cerebellar infarcts . The team will be addressing functional mobility, strength, stamina, balance, safety, adaptive techniques and equipment, self-care, bowel and bladder mgt, patient and caregiver education. Goals have been set at supervision. Anticipated discharge destination is home        See Team Conference Notes for weekly updates to the plan of care

## 2021-10-04 NOTE — Progress Notes (Addendum)
Occupational Therapy Session Note  Patient Details  Name: Javier [name] MRN: [id number] Date of Birth: 1945-11-03  Today's Date: 10/04/2021 OT Individual Time: 0175-1025 session 1 OT Individual Time Calculation (min): 58 min  Session 2: 1437-1535   Short Term Goals: Week 1:  OT Short Term Goal 1 (Week 1): Pt will complete stand pivot toilet transfer with supervision OT Short Term Goal 2 (Week 1): Pt will complete donn/doff pants with supervision OT Short Term Goal 3 (Week 1): Pt will bathe LB with CGA OT Short Term Goal 4 (Week 1): Pt will increase shoulder FF/abd to 4-/5 MMT  Skilled Therapeutic Interventions/Progress Updates:  Session 1: Pt greeted seated in w/c   agreeable to OT intervention. Session focus on BADL reeducation, functional mobility, dynamic standing balance, LUE coordination and decreasing overall caregiver burden.  Pt requested to toilet; total A transport into bathroom with pt completing stand pivot to toilet with use of grab bars and MIN A. Pt needed MODA for 3/3 toileting tasks as pt needed assist for cleanliness post BM and assist to mange pants.  Pt completed wash up at sink with overall MIN A needing assist for balance in standing and for thoroughness when washing RUE with LUE d/t LUE incoordination. Pt donned OH shirt with MIN A d/t LUE incoordination and MINA for LB dressing to thread LLE and pull pants up on L side.  Pt transported to gym with total A where remainder of session focus on LUE coordination with various activities such as reaching for clothespins in standing with 1.5 lb wrist weight donned on LUE to provide increased proprioception in LUE and decrease ataxia.   Additionally worked on bilateral integration tasks to improve ataxia such as tossing ball back and forth from sitting, ataxia greatly improved during bilateral tasks and when L elbow supported on table.  Pt transported to diners club where pt left in group with RT present.              Session 2:  Pt greeted seated in w/c   agreeable to OT intervention. Session focus on BADL reeducation, functional mobility, dynamic standing balance, LUE coordination and decreasing overall caregiver burden.      Pt completed ambulatory toilet transfer with Rw and MOD A for rw mgmt and LLE ataxia. Pt needed MOD A for 3/3 toileting tasks with pt needing assist to manage clothes on L side and for cleanliness d/t +BM. Pt ambulated to sink with Rw and MOD A, pt completed standing hygiene at sink with MIN A d/t incoordination in LUE when reaching against gravity for soap/paper towels.      Pt transported to gym in w/c for time mgmt where remainder of session focus on therapeutic activities focused on LUE coordination. Pt completed tracing shapes on BITS to work on improved LUE proprioception, pt needed assist to keep pointer finger opposed but once finger opposed pt did fairly well tracing shapes.  Attempted to grade task up and had pt reach against gravity with LUE to tap stimulus on screen with pt severely impacted by LUE ataxia, attempted to use 2 lb weight to provide proprioception however had even more difficulty reaching with smooth gross motor coordination.  Pt completed seated LUE [place] task with emphasis on compensatory method of providing proximal support from bilateral elbows on table with pt able to use LUE to thread medium sized beads on string as well as engage in matching game where pt instructed to flip over two blocks at a time  to determine if they match with an emphasis [facility]. Pt completed task with overall supervision, MIN verbal cues for sequencing task. Pt transported back to room with total A where pt left up in w/c with alarm belt activated and all needs within reach.                    Therapy Documentation Precautions:  Precautions Precautions: Fall, Other (comment) Precaution Comments: HOH, dysphagia diet, L hemibody ataxia Restrictions Weight Bearing Restrictions: No  Pain: pt  reports unrated pain in LUE during each session with AROM decreased for pain mgmt with rest breaks provided.    Therapy/Group: Individual Therapy  Corinne Ports [facility] 10/04/2021, 12:24 PM

## 2021-10-04 NOTE — Progress Notes (Signed)
PROGRESS NOTE   Subjective/Complaints:  Working withtherapy this AM. No new complaints.   ROS- limited by cognition , denies pain or SOB. No fevers or chills. No HA   Objective:   No results found. Recent Labs    10/02/21 0540  WBC 5.7  HGB 11.5*  HCT 34.4*  PLT 240    Recent Labs    10/02/21 0540 10/04/21 0547  NA 137 136  K 3.8 4.2  CL 103 100  CO2 26 30  GLUCOSE 91 103*  BUN 11 11  CREATININE 0.83 0.96  CALCIUM 8.7* 8.8*     Intake/Output Summary (Last 24 hours) at 10/04/2021 0741 Last data filed at 10/04/2021 0452 Gross per 24 hour  Intake 397 ml  Output 1300 ml  Net -903 ml         Physical Exam: Vital Signs Blood pressure 139/65, pulse 66, temperature 97.6 F (36.4 C), temperature source Oral, resp. rate 20, SpO2 96 %.  General: No acute distress, working with PT Mood and affect are appropriate Heart: Regular rate and rhythm no rubs murmurs or extra sounds Lungs: Clear to auscultation, breathing unlabored, no rales or wheezes Abdomen: Positive bowel sounds, soft nontender to palpation, nondistended Extremities: No clubbing, cyanosis, or edema Skin: No evidence of breakdown, no evidence of rash Neurologic: EOMI, no ptosis , motor strength is 5/5 in bilateral deltoid, bicep, tricep, grip, hip flexor, knee extensors, ankle dorsiflexor and plantar flexor Sensory exam normal sensation to light touch and proprioception in bilateral upper and lower extremities Cerebellar exam moderate ataxia Left Finger nose finger  Musculoskeletal: Full range of motion in all 4 extremities. No joint swelling PIV RUE has been removed  Assessment/Plan: 1. Functional deficits which require 3+ hours per day of interdisciplinary therapy in a comprehensive inpatient rehab setting. Physiatrist is providing close team supervision and 24 hour management of active medical problems listed below. Physiatrist and rehab team  continue to assess barriers to discharge/monitor patient progress toward functional and medical goals  Care Tool:  Bathing              Bathing assist Assist Level: Moderate Assistance - Patient 50 - 74%     Upper Body Dressing/Undressing Upper body dressing   What is the patient wearing?: Hospital gown only    Upper body assist Assist Level: Moderate Assistance - Patient 50 - 74%    Lower Body Dressing/Undressing Lower body dressing      What is the patient wearing?: Incontinence brief     Lower body assist Assist for lower body dressing: Moderate Assistance - Patient 50 - 74%     Toileting Toileting    Toileting assist Assist for toileting: Moderate Assistance - Patient 50 - 74%     Transfers Chair/bed transfer  Transfers assist     Chair/bed transfer assist level: Minimal Assistance - Patient > 75% Chair/bed transfer assistive device: Programmer, multimedia   Ambulation assist   Ambulation activity did not occur: Safety/medical concerns (Pt did not feel safe attempting gait unless using RW, which he did not use at baseline)  Assist level: Minimal Assistance - Patient > 75% Assistive device: Walker-rolling Max distance:  144f   Walk 10 feet activity   Assist  Walk 10 feet activity did not occur: Safety/medical concerns        Walk 50 feet activity   Assist Walk 50 feet with 2 turns activity did not occur: Safety/medical concerns         Walk 150 feet activity   Assist Walk 150 feet activity did not occur: Safety/medical concerns         Walk 10 feet on uneven surface  activity   Assist Walk 10 feet on uneven surfaces activity did not occur: Safety/medical concerns         Wheelchair     Assist Is the patient using a wheelchair?: Yes (for transport to/from gym)      Wheelchair assist level: Dependent - Patient 0%      Wheelchair 50 feet with 2 turns activity    Assist        Assist Level:  Dependent - Patient 0%   Wheelchair 150 feet activity     Assist      Assist Level: Dependent - Patient 0%   Blood pressure 139/65, pulse 66, temperature 97.6 F (36.4 C), temperature source Oral, resp. rate 20, SpO2 96 %.  Medical Problem List and Plan: 1. 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 Left cerebellar infarct extends into dorsal left midbrain onset 09/25/21.             -patient may  shower             -ELOS/Goals: 12-14 days, supervision goals with PT, OT, SLP  - Continue CIR PT/OT/SLP  -Ambulated > 200 ft 2.  Antithrombotics: -DVT/anticoagulation:  Pharmaceutical: Lovenox             -antiplatelet therapy: Plavix and aspirin for 3 months then aspirin alone 3. Pain Management: Tylenol as needed 4. Mood: LCSW to evaluate and provide emotional support             -antipsychotic agents: n/a 5. Neuropsych: This patient is capable of making decisions on his own behalf. 6. Skin/Wound Care: Routine skin care checks 7. Fluids/Electrolytes/Nutrition: Routine Is and Os and follow-up chemistries             -dys 3 diet with nectar; crush meds 8: Dysphagia; continue dysphagia 3 diet with nectar thick liquids             --Continue SLP, currently Dys3 with Nectar thick liquids, he is eating most meals  -Recheck BMP labs tomorrow, advise fluids intake   -Cr and BUN stable, continue to monitor 9: Hypertension: continue metoprolol 50 mg daily             --home Catapres and Benicar held and not restarted Vitals:   10/04/21 0451 10/04/21 1540  BP: 139/65 (!) 166/75  Pulse: 66 (!) 53  Resp: 20 16  Temp: 97.6 F (36.4 C) 97.6 F (36.4 C)  SpO2: 96% 98%  -6/2 A little elevated BP, will start a low dose irbesartan 10: Hyperlipidemia: continue Crestor 20 mg daily             --home Zetia and Repatha held not restarted 11: Prediabetes: random serum glucoses and CBGs within range --follow-up/monitor with PCP -glucose values appear good  overall 12: GERD: continue Protonix 40 mg daily 13: Hypokalemia: recheck BMP improved , monitor weekly              -has received supplement  -6/2 K+wnl 14: Anemia: not  symptomatic; follow-up CBC 6/1 improved to 11.5    Latest Ref Rng & Units 10/02/2021    5:40 AM 10/01/2021    4:37 AM 09/25/2021    4:00 AM  CBC  WBC 4.0 - 10.5 K/uL 5.7   5.3   6.1    Hemoglobin 13.0 - 17.0 g/dL 11.5   10.8   12.8    Hematocrit 39.0 - 52.0 % 34.4   32.9   39.4    Platelets 150 - 400 K/uL 240   220   208      15. Episodes of SVT--responded to IV metoprolol x 1 dose             -f/u EKG confirmed NSR             -continue metoprolol '50mg'$  daily as bove.      LOS: 3 days A FACE TO FACE EVALUATION WAS PERFORMED  Jennye Boroughs 10/04/2021, 7:41 AM

## 2021-10-04 NOTE — Progress Notes (Signed)
Occupational Therapy Session Note  Patient Details  Name: Javier [name] MRN: [id number] Date of Birth: [birth year]  Today's Date: 10/04/2021 OT Group Time: 1145-1200 OT Group Time Calculation (min): 15 min   Short Term Goals: Week 1:  OT Short Term Goal 1 (Week 1): Pt will complete stand pivot toilet transfer with supervision OT Short Term Goal 2 (Week 1): Pt will complete donn/doff pants with supervision OT Short Term Goal 3 (Week 1): Pt will bathe LB with CGA OT Short Term Goal 4 (Week 1): Pt will increase shoulder FF/abd to 4-/5 MMT Week 2:     Skilled Therapeutic Interventions/Progress Updates:    [employer] group with SLP with focus on self feeding follow diet D3/nectar thick precautions and education on cutting up foods. Pt did require cues to decr impulsivity and to finish swallowing bolus before taking another bite.  Pt did not have dentures which is recommended to help chew the D3 diet. Pt did use left hand at non dominant level; stabilizing  his elbow on the table for more accuracy with distal movement. Pt ate 100% of meal and drinks. Pt was taken back to gym via w/c and left with family member in prep for next session.    Therapy Documentation Precautions:  Precautions Precautions: Fall, Other (comment) Precaution Comments: HOH, dysphagia diet, L hemibody ataxia Restrictions Weight Bearing Restrictions: No  Pain: No c/o pain   Therapy/Group: Group Therapy  Willeen Cass [facility] 10/04/2021, 3:34 PM

## 2021-10-04 NOTE — Progress Notes (Signed)
Speech Language Pathology Daily Session Note  Patient Details  Name: Javier [name] MRN: [id number] Date of Birth: 1945-07-31  Today's Date: 10/04/2021 SLP Group Time: 1200-1230 SLP Group Time Calculation (min): 30 min  Short Term Goals: Week 1: SLP Short Term Goal 1 (Week 1): Pt will demonstrate selective attention to functional tasks for 10 minute duration with mod A verbal redirection SLP Short Term Goal 2 (Week 1): Pt will recall novel information with mod A for use of compensatory memory strategies to achieve 50% accuracy SLP Short Term Goal 3 (Week 1): Pt will complete basic problem solving with mod A verbal/visual cues to achieve 50% accuracy SLP Short Term Goal 4 (Week 1): Pt will demonstrate inellectual awareness of deficits by verbalizing at least 1 physical and 1 cognitive impairment/change s/p CVA with moderate verbal question/contextual cues SLP Short Term Goal 5 (Week 1): Patient will consume current diet with minimal overt s/sx of aspiration with min A for implementation of safe swallowing precautions/strategies SLP Short Term Goal 6 (Week 1): Pt will consume thin liquids with minimal overt s/sx of aspiration and min A cues to take small/single sips across 2-3 tx sessions prior to advancing liquids  Skilled Therapeutic Interventions: Skilled group treatment with OT in [place] focused on dysphagia and cognitive-linguistic goals. SLP facilitated session by providing Mod-Max A verbal and tactile cues for utilization of a slow rate of self feeding and to reduce impulsivity while consuming Dys.3 textures with nectar-thick liquids. Patient with mildly prolonged mastication today due to being edentulous with meal resembling more regular textures. Patient with overt cough X 2 but difficult to differentiate due to baseline coughing prior to initiating meal.  Recommend patient continue current diet. Patient with minimal social engagement throughout session with mildly reduced frustration  tolerance when cued for a slow rate. Patient transferred back to room at end of session and left with alarm on and all needs within reach. Continue with current plan of care.       Pain No/Denies Pain   Therapy/Group: Group Therapy  Stanlee Roehrig 10/04/2021, 1:16 PM

## 2021-10-05 DIAGNOSIS — I639 Cerebral infarction, unspecified: Secondary | ICD-10-CM | POA: Diagnosis not present

## 2021-10-05 NOTE — Progress Notes (Signed)
Speech Language Pathology Daily Session Note  Patient Details  Name: Javier [name] MRN: [id number] Date of Birth: [birth year]  Today's Date: 10/05/2021 SLP Individual Time: 0820-0900 SLP Individual Time Calculation (min): 40 min Today's Date: 10/05/2021 SLP Missed Time: 20 Minutes Missed Time Reason: Patient unwilling to participate  Short Term Goals: Week 1: SLP Short Term Goal 1 (Week 1): Pt will demonstrate selective attention to functional tasks for 10 minute duration with mod A verbal redirection SLP Short Term Goal 2 (Week 1): Pt will recall novel information with mod A for use of compensatory memory strategies to achieve 50% accuracy SLP Short Term Goal 3 (Week 1): Pt will complete basic problem solving with mod A verbal/visual cues to achieve 50% accuracy SLP Short Term Goal 4 (Week 1): Pt will demonstrate inellectual awareness of deficits by verbalizing at least 1 physical and 1 cognitive impairment/change s/p CVA with moderate verbal question/contextual cues SLP Short Term Goal 5 (Week 1): Patient will consume current diet with minimal overt s/sx of aspiration with min A for implementation of safe swallowing precautions/strategies SLP Short Term Goal 6 (Week 1): Pt will consume thin liquids with minimal overt s/sx of aspiration and min A cues to take small/single sips across 2-3 tx sessions prior to advancing liquids  Skilled Therapeutic Interventions: Pt seen for skilled ST with focus on cognitive and swallowing goals, pt in bed and agreeable to therapeutic tasks. Pt noted to have consumed 75-100% of AM meal, reports no difficulty. Pt initially agreeable to intake of thin liquids to assess for potential upgrade, SLP providing pt thin water via cup with verbal cues for small sips and slow rate. Pt drinking 1 sip of thin liquid then stating "that's enough" and refusing any more despite encouragement. Pt stating "I don't want that much". Educated on goal to upgrade to thin but needing  more intake to assess current swallow function, pt remains disagreeable to further intake at this time. SLP facilitating selective attention during simple conversation by providing mod A cues for redirection ~every 4 minutes. Pt mostly answering in one-word, very short phrases or "I don't know" to simple questions and rapport building. Pt was able to recall "small stroke in the back of my head" but unable to differentiate between R - L? Holding up L hand reporting changes but stating it is his R hand and R side is impaired. Not agreeable to SLP attempts to increase awareness of L sided impairment. At this time, pt becoming mildly agitated and difficult to engage in further therapeutic activities, again refusing thin liquid trials. Pt missed 20 minutes of therapy due to refusal to participate. Pt left in bed with alarm set and all needs within reach, cont ST POC.  Pain Pain Assessment Pain Scale: 0-10 Pain Score: 0-No pain  Therapy/Group: Individual Therapy  Dewaine Conger 10/05/2021, 8:51 AM

## 2021-10-05 NOTE — Progress Notes (Signed)
Occupational Therapy Session Note  Patient Details  Name: Javier [name] MRN: [id number] Date of Birth: 10-05-1945  Today's Date: 10/05/2021 OT Individual Time: 1000-1100 OT Individual Time Calculation (min): 60 min    Short Term Goals: Week 1:  OT Short Term Goal 1 (Week 1): Pt will complete stand pivot toilet transfer with supervision OT Short Term Goal 2 (Week 1): Pt will complete donn/doff pants with supervision OT Short Term Goal 3 (Week 1): Pt will bathe LB with CGA OT Short Term Goal 4 (Week 1): Pt will increase shoulder FF/abd to 4-/5 MMT Week 2:     Skilled Therapeutic Interventions/Progress Updates:    Patient seen this AM for skilled OT, pt complete bathing exercise at sink LOF.  The pt completed toileting exercise prior to performing BADL in bathing, he was able to transfer from bed level to EOB with CGA and reminders for safe transfer from EOB to w/c LOF.  The pt was able to tranfers to the w/c using a stand pivot transfer with MinA.  The pt was able to roll himself to the entrance of the restroom and upon entering he was able able to lock the brakes of the w/c and reach for the grab bar, transferring to the commode using a stand pivot transfer with CGA, he required [place] for removing his pant and brief. The pt was able to transfer onto the commode with MinA.  The pt was able to complete the toileting task of wiping himself and bathing his private area and bottom with s/u  to MinA for assist.  The pt transferred back to his w/c with MinA using the grab bars to complete a stand pivot transfer.  The pt returned to his living quarters and was able to complete the bathing task at sink LOF, bathing his UB and bilateral ULE with s/u assist.  The pt applied lotion and deodorant with s/u assist and vc's for compliance.  The pt was able to put on his pull over shirt with s/u and he required Mod/MaxA for donning his brief and pants secondary to challenges with bilateral hand manipulation and  decrease strength. The pt was able to complete UB exercises using a 1lb dowel for shld flexion with AROM of the LUE OF 10-15 degrees, he was able to take the RUE into full range. The pt remained at w/c LOF at the end of the treatment session with his alarm in place, his bedside table and call light within reach. All additional needs were addressed with no c/o pain at the time of OT service delivery.   Therapy Documentation Precautions:  Precautions Precautions: Fall, Other (comment) Precaution Comments: HOH, dysphagia diet, L hemibody ataxia Restrictions Weight Bearing Restrictions: No   Therapy/Group: Individual Therapy  Yvonne Kendall 10/05/2021, 5:24 PM

## 2021-10-05 NOTE — Progress Notes (Signed)
PROGRESS NOTE   Subjective/Complaints: Patient seen at bedside today no overnight complaints.  ROS: Limited by cognition. Denies fever, pain, cough, SOB, headache, N/V/D.   Objective:   No results found. No results for input(s): WBC, HGB, HCT, PLT in the last 72 hours. Recent Labs    10/04/21 0547  NA 136  K 4.2  CL 100  CO2 30  GLUCOSE 103*  BUN 11  CREATININE 0.96  CALCIUM 8.8*    Intake/Output Summary (Last 24 hours) at 10/05/2021 1336 Last data filed at 10/05/2021 0830 Gross per 24 hour  Intake 358 ml  Output 650 ml  Net -292 ml        Physical Exam: Vital Signs Blood pressure (!) 166/79, pulse (!) 54, temperature 98 F (36.7 C), resp. rate 18, SpO2 97 %.   General: Alert and oriented x 3, No apparent distress HEENT: Head is normocephalic, atraumatic, PERRLA, EOMI, sclera anicteric, oral mucosa pink and moist, dentition intact, ext ear canals clear,  Neck: Supple without JVD or lymphadenopathy Heart: Reg rate and rhythm. No murmurs rubs or gallops Chest: CTA bilaterally without wheezes, rales, or rhonchi; no distress Abdomen: Soft, non-tender, non-distended, bowel sounds positive. Extremities: No clubbing, cyanosis, or edema. Pulses are 2+ Psych: Pt's affect is appropriate. Pt is cooperative Skin: Clean and intact without signs of breakdown Neuro: No Ptosis, EOMI. 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 Musculoskeletal: FROM all 4 extremities. Absence of joint swelling. Note: PIV removed.   Assessment/Plan: 1. Functional deficits which require 3+ hours per day of interdisciplinary therapy in a comprehensive inpatient rehab setting. Physiatrist is providing close team supervision and 24 hour management of  active medical problems listed below. Physiatrist and rehab team continue to assess barriers to discharge/monitor patient progress toward functional and medical goals  Care Tool:  Bathing    Body parts bathed by patient: Right arm, Left arm, Chest, Abdomen, Front perineal area, Buttocks, Right upper leg, Left upper leg         Bathing assist Assist Level: Minimal Assistance - Patient > 75%     Upper Body Dressing/Undressing Upper body dressing   What is the patient wearing?: Pull over shirt    Upper body assist Assist Level: Minimal Assistance - Patient > 75%    Lower Body Dressing/Undressing Lower body dressing      What is the patient wearing?: Underwear/pull up, Pants     Lower body assist Assist for lower body dressing: Moderate Assistance - Patient 50 - 74%     Toileting Toileting    Toileting assist Assist for toileting: Moderate Assistance - Patient 50 - 74%     Transfers Chair/bed transfer  Transfers assist     Chair/bed transfer assist level: Minimal Assistance - Patient > 75% Chair/bed transfer assistive device: Programmer, multimedia   Ambulation assist   Ambulation activity did not occur: Safety/medical concerns (Pt did not feel safe attempting gait unless using RW, which he did not use at baseline)  Assist level: Minimal Assistance -  Patient > 75% Assistive device: Walker-rolling Max distance: 166f   Walk 10 feet activity   Assist  Walk 10 feet activity did not occur: Safety/medical concerns        Walk 50 feet activity   Assist Walk 50 feet with 2 turns activity did not occur: Safety/medical concerns         Walk 150 feet activity   Assist Walk 150 feet activity did not occur: Safety/medical concerns         Walk 10 feet on uneven surface  activity   Assist Walk 10 feet on uneven surfaces activity did not occur: Safety/medical concerns         Wheelchair     Assist Is the patient using a  wheelchair?: Yes (for transport to/from gym)      Wheelchair assist level: Dependent - Patient 0%      Wheelchair 50 feet with 2 turns activity    Assist        Assist Level: Dependent - Patient 0%   Wheelchair 150 feet activity     Assist      Assist Level: Dependent - Patient 0%   Blood pressure (!) 166/79, pulse (!) 54, temperature 98 F (36.7 C), resp. rate 18, SpO2 97 %.  Medical Problem List and Plan: 1. 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 Left cerebellar infarct extends into dorsal left midbrain onset 09/25/21.             -patient may  shower             -ELOS/Goals: 12-14 days, supervision goals with PT, OT, SLP             - Continue CIR PT/OT/SLP             -Ambulated > 200 ft 2.  Antithrombotics: -DVT/anticoagulation:  Pharmaceutical: Lovenox             -antiplatelet therapy: Plavix and aspirin for 3 months then aspirin alone 3. Pain Management: Tylenol as needed 4. Mood: LCSW to evaluate and provide emotional support             -antipsychotic agents: n/a 5. Neuropsych: This patient is capable of making decisions on his own behalf. 6. Skin/Wound Care: Routine skin care checks 7. Fluids/Electrolytes/Nutrition: Routine Is and Os and follow-up chemistries             -dys 3 diet with nectar; crush meds 8: Dysphagia; continue dysphagia 3 diet with nectar thick liquids             --Continue SLP, currently Dys3 with Nectar thick liquids, he is eating most meals             -Recheck BMP labs tomorrow, advise fluids intake              -Cr and BUN stable, continue to monitor 6/3 BUN 11, Crt 0.96    Latest Ref Rng & Units 10/04/2021    5:47 AM 10/02/2021    5:40 AM 10/01/2021    4:37 AM  BMP  Glucose 70 - 99 mg/dL 103   91   101    BUN 8 - 23 mg/dL '11   11   11    '$ Creatinine 0.61 - 1.24 mg/dL 0.96   0.83   0.73    Sodium 135 - 145 mmol/L 136   137   137    Potassium 3.5 - 5.1 mmol/L 4.2   3.8  3.2    Chloride 98  - 111 mmol/L 100   103   103    CO2 22 - 32 mmol/L '30   26   27    '$ Calcium 8.9 - 10.3 mg/dL 8.8   8.7   8.2    9: Hypertension: continue metoprolol 50 mg daily             --home Catapres and Benicar held and not restarted    10/05/2021    4:47 AM 10/04/2021    7:44 PM 10/04/2021    3:40 PM  Vitals with BMI  Systolic 440 347 425  Diastolic 79 62 75  Pulse 54 56 53   -6/2 A little elevated BP, will start a low dose irbesartan 6/3 SBP somewhat labile 138-166 today, continue 50 mg metoprolol daily along with irbesartan 37.5 mg daily 10: Hyperlipidemia: continue Crestor 20 mg daily             --home Zetia and Repatha held not restarted 11: Prediabetes: random serum glucoses and CBGs within range --follow-up/monitor with PCP -glucose values appear good overall 12: GERD: continue Protonix 40 mg daily 13: Hypokalemia: recheck BMP improved , monitor weekly              -has received supplement             -6/2 K+wnl 6/3 Trend with qMonday labs 14: Anemia: not symptomatic; follow-up CBC 6/1 improved to 11.5 6/3 Trend with qMonday labs.    Latest Ref Rng & Units 10/02/2021    5:40 AM 10/01/2021    4:37 AM 09/25/2021    4:00 AM  CBC  WBC 4.0 - 10.5 K/uL 5.7   5.3   6.1    Hemoglobin 13.0 - 17.0 g/dL 11.5   10.8   12.8    Hematocrit 39.0 - 52.0 % 34.4   32.9   39.4    Platelets 150 - 400 K/uL 240   220   208      15. Episodes of SVT--responded to IV metoprolol x 1 dose             -f/u EKG confirmed NSR             -continue metoprolol '50mg'$  daily as bove.  6/3 no episodes of SVT    LOS: 4 days A FACE TO FACE EVALUATION WAS PERFORMED  Luetta Nutting 10/05/2021, 1:36 PM

## 2021-10-05 NOTE — Progress Notes (Signed)
Physical Therapy Session Note  Patient Details  Name: Javier [name] MRN: [id number] Date of Birth: 07/05/1945  Today's Date: 10/05/2021 PT Individual Time: 1409-1505 PT Individual Time Calculation (min): 56 min   Short Term Goals: Week 1:  PT Short Term Goal 1 (Week 1): Pt will perform supine<>sit with CGA consistently PT Short Term Goal 2 (Week 1): Pt will perform sit<>stands using LRAD with CGA consistently PT Short Term Goal 3 (Week 1): Pt will perform bed<>chair transfers using LRAD with CGA consistently PT Short Term Goal 4 (Week 1): Pt will ambulate at least 135f using LRAD with CGA PT Short Term Goal 5 (Week 1): Pt will navigate 2 steps per home set-up with min assist  Skilled Therapeutic Interventions/Progress Updates:    Pt received sitting in w/c and agreeable to therapy session. Donned tennis shoes total assist for time. Pt's personal RW had been delivered to his room therefore therapist set it up to correct height for pt and utilized it during session. Sit<>stands using RW with CGA for steadying and pt having truncal ataxia while rising with L lean bias. Gait training ~1538fto main therapy gym using RW with CGA/light min assist for balance - continues to have excessive forward trunk lean with increased WBing down through AD causing it to drag back legs on the floor, continues to have LUE ataxia causing AD to "shake" back and forth while walking, and continues to have slight L LE ataxia noted during swing with heavy heel landing on initial contact although improving.   Therapist swapped out rubber stoppers on back legs of RW to the smoother plastic ones to decrease the drag and "shake" of the AD due to pt's ataxia.  Gait training using RW ~1402fith CGA/light min assist and pt demonstrating smoother AD movement but then progresses to leaning forward heavily on AD causing it to move quickly out in front of him therefore need to continue working to improve safety with  this.  Stair navigation training ascending/descending 4 (6" height) steps x3 using B HRs with CGA/light min assist for balance - reciprocal stepping in both directions - continues to have truncal ataxia relying heavily on B UE support on handrails to maintain balance - L UE ataxia with over- and under-shooting when reaching to grab the HR.   Donned maxi-sky harness in sitting and with pt standing using RW support with CGA for steadying/balance - continues to have truncal ataxia with L lean bias.  Gait training in maxi-sky harness as follows:  - pt always requesting to stand up using RW for balance due to fear of falling without AD - ~59f46fth heavy L HHA providing heavy min assist for balance; demos increased upright posture with improved B LE reciprocal stepping pattern that is more symmetrical although pt really pushing down hard through L hand with guarded upper body posturing due to ataxia - ~15ft81feways each direction with heavy L HHA for balance and pt hesitant to do this because he feels increased unsteadiness making him afraid of falling  - 30ft 56fout UE support with heavy min assist for balance due to primarily anterior lean/LOB and increased truncal and LE ataxia due to inability to compensate with L UE support - LOB when turning requrinig heavy mod assist to maintain balance and pt reaching out to catch himself on // bar railing  Doffed maxi-move harness.  Performed repeated sit<>stands to/from EOM without AD but pt using B UE support on therapist's arms as needed for pt comfort  due to fear of falling with CGA/light min assist for steadying. Once in standing, performed static balance with pt having decreased postural sway compared to last seen by this therapist - progressed to R/L head turns in standing.  Gait training ~124f back to room using RW with CGA/light min assist as described above with improvement in smoothness of forward AD progression from changing out RW back leg  stoppers. Standing with min assist for balance pt continent of bladder in urinal.  Pt left seated in w/c with needs in reach and seat belt alarm on.       Therapy Documentation Precautions:  Precautions Precautions: Fall, Other (comment) Precaution Comments: HOH, dysphagia diet, L hemibody ataxia Restrictions Weight Bearing Restrictions: No  Pain:  Denies pain during session.    Therapy/Group: Individual Therapy  CTawana Scale, PT, DPT, NCS, CSRS 10/05/2021, 1:05 PM

## 2021-10-06 DIAGNOSIS — I639 Cerebral infarction, unspecified: Secondary | ICD-10-CM | POA: Diagnosis not present

## 2021-10-06 NOTE — Progress Notes (Signed)
PROGRESS NOTE   Subjective/Complaints: Patient seen at bedside today no overnight complaints.  Says that he is doing fine.  ROS: Limited by cognition. Denies fever, pain, cough, SOB, headache, N/V/D.   Objective:   No results found. No results for input(s): WBC, HGB, HCT, PLT in the last 72 hours. Recent Labs    10/04/21 0547  NA 136  K 4.2  CL 100  CO2 30  GLUCOSE 103*  BUN 11  CREATININE 0.96  CALCIUM 8.8*    Intake/Output Summary (Last 24 hours) at 10/06/2021 1333 Last data filed at 10/06/2021 1250 Gross per 24 hour  Intake 340 ml  Output 750 ml  Net -410 ml        Physical Exam: Vital Signs Blood pressure (!) 157/64, pulse (!) 51, temperature 97.7 F (36.5 C), temperature source Oral, resp. rate 18, SpO2 96 %.   General: Alert and oriented x 3, Not in apparent distress HEENT: Head is normocephalic, atraumatic, PERRLA, EOMI, sclera anicteric, oral mucosa pink and moist, dentition intact, ext ear canals clear,  Neck: Supple without JVD or lymphadenopathy Heart: Reg rate and rhythm. No murmurs rubs or gallops Chest: CTA bilaterally without wheezes, rales, or rhonchi; no distress Abdomen: Soft, non-tender, non-distended, bowel sounds positive. Extremities: No clubbing, cyanosis, or edema. Pulses are 2+ Psych: Pt's affect is appropriate. Pt is cooperative Skin: Clean and intact without signs of breakdown Neuro: No Ptosis, EOMI. 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 Sensory: Normal to LT and proprioception BUE/BLE Motor: 5/5 strength bilateral deltoid, biceps, triceps, grip, hip flexor, knee extensors, ankle dorsiflexor and plantar flexor. Musculoskeletal: FROM all 4 extremities. Absence of joint swelling. Note: PIV removed.   Assessment/Plan: 1. Functional deficits which require 3+ hours per day of interdisciplinary therapy in a comprehensive inpatient rehab setting. Physiatrist is  providing close team supervision and 24 hour management of active medical problems listed below. Physiatrist and rehab team continue to assess barriers to discharge/monitor patient progress toward functional and medical goals  Care Tool:  Bathing    Body parts bathed by patient: Right arm, Left arm, Chest, Abdomen, Front perineal area, Buttocks, Right upper leg, Left upper leg         Bathing assist Assist Level: Minimal Assistance - Patient > 75%     Upper Body Dressing/Undressing Upper body dressing   What is the patient wearing?: Pull over shirt    Upper body assist Assist Level: Minimal Assistance - Patient > 75%    Lower Body Dressing/Undressing Lower body dressing      What is the patient wearing?: Underwear/pull up, Pants     Lower body assist Assist for lower body dressing: Moderate Assistance - Patient 50 - 74%     Toileting Toileting    Toileting assist Assist for toileting: Moderate Assistance - Patient 50 - 74%     Transfers Chair/bed transfer  Transfers assist     Chair/bed transfer assist level: Minimal Assistance - Patient > 75% Chair/bed transfer assistive device: Programmer, multimedia   Ambulation assist   Ambulation activity did not occur: Safety/medical concerns (Pt did not feel safe attempting gait unless using RW, which he  did not use at baseline)  Assist level: Minimal Assistance - Patient > 75% Assistive device: Walker-rolling Max distance: 166f   Walk 10 feet activity   Assist  Walk 10 feet activity did not occur: Safety/medical concerns        Walk 50 feet activity   Assist Walk 50 feet with 2 turns activity did not occur: Safety/medical concerns         Walk 150 feet activity   Assist Walk 150 feet activity did not occur: Safety/medical concerns         Walk 10 feet on uneven surface  activity   Assist Walk 10 feet on uneven surfaces activity did not occur: Safety/medical concerns          Wheelchair     Assist Is the patient using a wheelchair?: Yes (for transport to/from gym)      Wheelchair assist level: Dependent - Patient 0%      Wheelchair 50 feet with 2 turns activity    Assist        Assist Level: Dependent - Patient 0%   Wheelchair 150 feet activity     Assist      Assist Level: Dependent - Patient 0%   Blood pressure (!) 157/64, pulse (!) 51, temperature 97.7 F (36.5 C), temperature source Oral, resp. rate 18, SpO2 96 %.  Medical Problem List and Plan: 1. 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 Left cerebellar infarct extends into dorsal left midbrain onset 09/25/21.             -patient may  shower             -ELOS/Goals: 12-14 days, supervision goals with PT, OT, SLP             - Continue CIR PT/OT/SLP             -Ambulated > 200 ft 2.  Antithrombotics: -DVT/anticoagulation:  Pharmaceutical: Lovenox             -antiplatelet therapy: Plavix and aspirin for 3 months then aspirin alone 3. Pain Management: Tylenol as needed 4. Mood: LCSW to evaluate and provide emotional support             -antipsychotic agents: n/a 5. Neuropsych: This patient is capable of making decisions on his own behalf. 6. Skin/Wound Care: Routine skin care checks 7. Fluids/Electrolytes/Nutrition: Routine Is and Os and follow-up chemistries             -dys 3 diet with nectar; crush meds 8: Dysphagia; continue dysphagia 3 diet with nectar thick liquids             --Continue SLP, currently Dys3 with Nectar thick liquids, he is eating most meals             -Recheck BMP labs tomorrow, advise fluids intake              -Cr and BUN stable, continue to monitor 6/3 BUN 11, Crt 0.96 6/4 Follow with qMonday labs    Latest Ref Rng & Units 10/04/2021    5:47 AM 10/02/2021    5:40 AM 10/01/2021    4:37 AM  BMP  Glucose 70 - 99 mg/dL 103   91   101    BUN 8 - 23 mg/dL '11   11   11    '$ Creatinine 0.61 - 1.24 mg/dL 0.96   0.83    0.73    Sodium 135 - 145 mmol/L  136   137   137    Potassium 3.5 - 5.1 mmol/L 4.2   3.8   3.2    Chloride 98 - 111 mmol/L 100   103   103    CO2 22 - 32 mmol/L '30   26   27    '$ Calcium 8.9 - 10.3 mg/dL 8.8   8.7   8.2    9: Hypertension: continue metoprolol 50 mg daily             --home Catapres and Benicar held and not restarted    10/06/2021    3:52 AM 10/05/2021    7:30 PM 10/05/2021    4:47 AM  Vitals with BMI  Systolic 287 867 672  Diastolic 64 78 79  Pulse 51 62 54   -6/2 A little elevated BP, will start a low dose irbesartan 6/3 SBP somewhat labile 138-166 today, continue 50 mg metoprolol daily along with irbesartan 37.5 mg daily 6/4 So far SBP still elevated 140's-150's.  Consider dose escalation if still elevated. 10: Hyperlipidemia: continue Crestor 20 mg daily             --home Zetia and Repatha held not restarted 11: Prediabetes: random serum glucoses and CBGs within range --follow-up/monitor with PCP -glucose values appear good overall 12: GERD: continue Protonix 40 mg daily 13: Hypokalemia: recheck BMP improved , monitor weekly              -has received supplement             -6/2 K+wnl 6/4 Trend with qMonday labs 14: Anemia: not symptomatic; follow-up CBC 6/1 improved to 11.5 6/4 Trend with qMonday labs.    Latest Ref Rng & Units 10/02/2021    5:40 AM 10/01/2021    4:37 AM 09/25/2021    4:00 AM  CBC  WBC 4.0 - 10.5 K/uL 5.7   5.3   6.1    Hemoglobin 13.0 - 17.0 g/dL 11.5   10.8   12.8    Hematocrit 39.0 - 52.0 % 34.4   32.9   39.4    Platelets 150 - 400 K/uL 240   220   208      15. Episodes of SVT--responded to IV metoprolol x 1 dose             -f/u EKG confirmed NSR             -continue metoprolol '50mg'$  daily as bove.  6/4 no episodes of SVT    LOS: 5 days A FACE TO FACE EVALUATION WAS PERFORMED  Luetta Nutting 10/06/2021, 1:33 PM

## 2021-10-07 ENCOUNTER — Encounter (HOSPITAL_COMMUNITY): Payer: Self-pay | Admitting: Physical Medicine & Rehabilitation

## 2021-10-07 DIAGNOSIS — I639 Cerebral infarction, unspecified: Secondary | ICD-10-CM | POA: Diagnosis not present

## 2021-10-07 DIAGNOSIS — I471 Supraventricular tachycardia: Secondary | ICD-10-CM | POA: Diagnosis not present

## 2021-10-07 LAB — BASIC METABOLIC PANEL
Anion gap: 5 (ref 5–15)
BUN: 14 mg/dL (ref 8–23)
CO2: 28 mmol/L (ref 22–32)
Calcium: 8.8 mg/dL — ABNORMAL LOW (ref 8.9–10.3)
Chloride: 102 mmol/L (ref 98–111)
Creatinine, Ser: 0.83 mg/dL (ref 0.61–1.24)
GFR, Estimated: >60 mL/min (ref >60)
Glucose, Bld: 104 mg/dL — ABNORMAL HIGH (ref 70–99)
Potassium: 3.8 mmol/L (ref 3.5–5.1)
Sodium: 135 mmol/L (ref 135–145)

## 2021-10-07 LAB — CBC
HCT: 35.2 % — ABNORMAL LOW (ref 39.0–52.0)
Hemoglobin: 12 g/dL — ABNORMAL LOW (ref 13.0–17.0)
MCH: 31.7 pg (ref 26.0–34.0)
MCHC: 34.1 g/dL (ref 30.0–36.0)
MCV: 92.9 fL (ref 80.0–100.0)
Platelets: 262 10*3/uL (ref 150–400)
RBC: 3.79 MIL/uL — ABNORMAL LOW (ref 4.22–5.81)
RDW: 12.9 % (ref 11.5–15.5)
WBC: 6.5 10*3/uL (ref 4.0–10.5)
nRBC: 0 % (ref 0.0–0.2)

## 2021-10-07 LAB — TSH: TSH: 1.71 u[IU]/mL (ref 0.350–4.500)

## 2021-10-07 LAB — MAGNESIUM: Magnesium: 1.7 mg/dL (ref 1.7–2.4)

## 2021-10-07 MED ORDER — METOPROLOL SUCCINATE ER 25 MG PO TB24: 25.0000 mg | ORAL_TABLET | Freq: Once | ORAL | Status: DC

## 2021-10-07 MED ORDER — METOPROLOL SUCCINATE ER 50 MG PO TB24
50.0000 mg | ORAL_TABLET | Freq: Every day | ORAL | Status: DC
  Administered 2021-10-08 – 2021-10-09 (×2): 50 mg via ORAL
  Filled 2021-10-07 (×2): qty 1

## 2021-10-07 MED ORDER — METOPROLOL SUCCINATE ER 50 MG PO TB24: 75.0000 mg | ORAL_TABLET | Freq: Every day | ORAL | Status: DC

## 2021-10-07 NOTE — Progress Notes (Signed)
Speech Language Pathology Daily Session Note  Patient Details  Name: Javier [name] MRN: [id number] Date of Birth: Dec 27, 1945  Today's Date: 10/07/2021 SLP Individual Time: 0950-1020 SLP Individual Time Calculation (min): 30 min  Short Term Goals: Week 1: SLP Short Term Goal 1 (Week 1): Pt will demonstrate selective attention to functional tasks for 10 minute duration with mod A verbal redirection. - Did not formally address this session.  SLP Short Term Goal 2 (Week 1): Pt will recall novel information with mod A for use of compensatory memory strategies to achieve 50% accuracy. - Did not formally address this session.  SLP Short Term Goal 3 (Week 1): Pt will complete basic problem solving with mod A verbal/visual cues to achieve 50% accuracy. - Pt required Total A to utilize call bell for tolieting purposes and Min A for sequencing steps of transfer from bed to RW. Provided re-education re: use of external aids and reasons for utilizing call bell.  SLP Short Term Goal 4 (Week 1): Pt will demonstrate inellectual awareness of deficits by verbalizing at least 1 physical and 1 cognitive impairment/change s/p CVA with moderate verbal question/contextual cues. - Pt unable to participate due to headache.  SLP Short Term Goal 5 (Week 1): Patient will consume current diet with minimal overt s/sx of aspiration with min A for implementation of safe swallowing precautions/strategies. - Pt consumed ~3 oz of nectar-thick liquids with minimal overt s/sx concerning for aspiration with Mod A for implementation of safe swallowing strategies to include small sips and upright positioning. Accepted whole pills with nectar-thick liquid via straw with cough post-prandially x 1.  Addition coughing noted in the absence of PO. Per chart review, VSS to include SpO2, temp, and WBC. Per RN, cough began 2 days ago. Will continue to monitor.  SLP Short Term Goal 6 (Week 1): Pt will consume thin liquids with minimal overt  s/sx of aspiration and min A cues to take small/single sips across 2-3 tx sessions prior to advancing liquids. - Did not formally address this session.  Skilled Therapeutic Interventions: Pt seen this date for skilled ST intervention targeting deglutition and cognitive-linguistic goals outlined above. Pt received in bed and reporting headache; RN notified. States he received medication for his headache, though when RN was notified she stated he had not received medication. RN provided PRN medication. Agreeable to ST intervention. Please see above for objective data re: pt's performance on targeted goals.   Pt education provided re: aspiration precautions, fall precautions, and importance of utilizing call bell to notify staff of need for tolieting, getting out of bed, pain, and reaching items that are inaccessible; pt verbalized understanding though was unable to demonstrate understanding. Reviewed signage in room ("call, don't fall") to facilitate recall. Required Total A to utilize call bell for tolieting purposes and for notifying RN of headache. Vitals obtained with reports of headache; SpO2 was noted to be 100% and HR 148. RN notified MD of vital signs. Pt with limited participation this date due to pain. Benefited from Mod A for implementation of aspiration precautions with nectar-thick liquid. Cough noted post-prandially x 1 when medication was taken whole with nectar-thick liquid, though it should be noted that productive cough was also noted in the absence of PO. Per d/w RN, pt with cough for 2 days and VSS per chart review. Will continue to monitor for tolerance of current diet. In the interim, continue to recommend current diet consistencies with full supervision and medications whole with puree. Required Mod A +  2 for safety during transfer from bed to bathroom with RW.   Pt left in direct care of NT. Encouraged staff to provide fluids throughout the day. Continue per current ST POC.  Pain Pt  reports severe headache; RN notified  Therapy/Group: Individual Therapy  Tovah Slavick A Rishika Mccollom 10/07/2021, 1:32 PM

## 2021-10-07 NOTE — Progress Notes (Signed)
Occupational Therapy Session Note  Patient Details  Name: Javier [name] MRN: [id number] Date of Birth: November 20, 1945  Today's Date: 10/07/2021 OT Individual Time: 0802-0910 session 1  OT Individual Time Calculation (min): 68 min  7 mins missed d/t dizziness; low BP Session 2: 1446- 1530   Short Term Goals: Week 1:  OT Short Term Goal 1 (Week 1): Pt will complete stand pivot toilet transfer with supervision OT Short Term Goal 2 (Week 1): Pt will complete donn/doff pants with supervision OT Short Term Goal 3 (Week 1): Pt will bathe LB with CGA OT Short Term Goal 4 (Week 1): Pt will increase shoulder FF/abd to 4-/5 MMT  Skilled Therapeutic Interventions/Progress Updates:  Session 1: Pt greeted seated in w/c finishing breakfast and  agreeable to OT intervention. Session focus on BADL reeducation, functional mobility, dynamic standing balance and decreasing overall caregiver burden.   Called pts wife to confirm shower set- up wife reporting bathtub, explained to wife that at this point this OTA recommends sink baths as getting down into bathtub would increase risk of falls. Pts wife reports she will bathe her husband if she needs to.    Pt able to ambulate to bathroom with Rw and MINA, MIN cues needed for hand placment and MIN A for RW mgmt, + BM with pt needing MIN A for 3/3 toileting tasks, assist needed to manage pants on L side.pt exited bathroom with RW and MIN A where pt sat at sink for bathing; MIN A overall for bathing tasks at sink via sit>stand with RW. MIN A needed for standing balance and cleanliness. Pt donned [place] shirt with set- up assist and MIN A for LB dressing to thread RLE and pull pants up in standing on L side.  Pt transported to gym from w/c with total A where focused on stand pivot transfers as precursor to stand pivot toilet transfer to [facility]. Heavy emphasis on hand placement and safety awareness, pt able to progress from MIN A to CGA.  Pt then reports not feeling well wanting  to lay down BP assessed with pt reporting dizziness: supine in gym 106/82 ( 88) HR 149-146 Returned pt to room with total A and assisted pt back to bed with MINA for increased safety d/t dizziness BP from supine:95/71 ( 78)  HR 147 RN enter at this time to assess pt.  pt left supine in bed with bed alarm activated and all needs within reach.                     Session 2: Pt greeted seated in w/c,pt  agreeable to OT intervention. RN approve session with assessment of HR throughout session. Session focus on LUE coordination and BADL reeducation. Pt completed functional ambulation to bathroom with Rw and MIN A, + BM MIN A for 3/3 toileting tasks needing asssit for clothing mgmt, MIN A to exit bathroom via functional ambulation with RW. Pt completed seated hand hygiene at sink with supervision.pt transported ot gym in w/c with total A for time mgmt. Pt completed table activities as indicated below to facilitate improved [facility] and focus on compensatory methods for managing ataxia during ADLS. - pt instructed to reorganize ping pong balls in egg crate based on visual aid provided with an emphasis on digit flexion/extension and improved motor planning.  pt instructed to keep elbows supported on table to decrease ataxia. Pt needed MOD  verbal cues for sequencing, problem solving and accuracy. Pt noted to become easily frustrated  during task stating "this hand ain't as good as this one." MAX education provided on L hand being affected by stroke which is why pt is here in rehab to work on coordination in L hand.    - in hand manipulation skills with deck of cards with pt instructed to flip over one card at a time and place in correct pile based on number. Noted pt with decreased ataxia when pt applies pressure to L hand when bringing card more proximal to pt, education provided to pt on using the strategy when reaching for ADL items at sink, little carryover noted of education despite max multimodal efforts.  Pt  transported back to room with total A where pt left up in w/c with alarm belt activated and all needs within reach.            HR 70-90 bpm during session   Therapy Documentation Precautions:  Precautions Precautions: Fall, Other (comment) Precaution Comments: HOH, dysphagia diet, L hemibody ataxia Restrictions Weight Bearing Restrictions: No  Pain: Session 1: pain from dizziness, repositioning offered as needed and returned pt to room to allow RN to further assess  Session 2: no pain reported during session    Therapy/Group: Individual Therapy  Corinne Ports [facility] 10/07/2021, 9:13 AM

## 2021-10-07 NOTE — Consult Note (Addendum)
Cardiology Consultation:   Patient ID: Javier [name] MRN: [id number]; DOB: June 21, 1945  Admit date: 10/01/2021 Date of Consult: 10/07/2021  PCP:  No primary care provider on file.   [facility] Providers Cardiologist:  Donato Heinz, MD        Patient Profile:   Javier [name] is a 76 y.o. male with a hx of HTN, HLD, GERD, ETOH use disorder admitted 09/25/21 with dizziness and poor balance dx with CVA who is being seen 10/07/2021 for the evaluation of tachycardia at the request of Dr. Letta Pate.  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   No tPA as outside window.  Echo done 09/25/21 with EF 65-70% no RWMA, mild asymmetric LVH, mild dilatation of the aortic root at 40 mm.  No PFO, LA size was mildly dilated.  Plan for ASA + Plavix for 90 days then ASA alone. On statin and plan 30 day monitor at discharge from rehab.  No TEE was done.  He is on metoprolol XL 50 daily, benicar at home here irbesartan 37.5.    Pt was transferred to [facility] 10/01/21. It is noted on 09/27/21 HR to 39 briefly and HR back to 50s.  AM BB held.    On 10/01/21 HR was 146. ST he rec'd 5 mg IV lopressor and HR to 72.  No tachycardia noted since though some brady at 51.    Today back in tachycardia though converted spontaneously.  Pt is aware of rapid HR.  Feels his heart racing. No chest pain. He does note he had some rapid HR at home prior to admit.  Does not last long.  No syncope  He was able to participate in therapy with HR to 108 only.   EKG:  The EKG was personally reviewed and demonstrates:  SVT at 147 with ST depression inf lat with rate.   Telemetry:  Telemetry was personally reviewed and demonstrates:  no tele.   Labs: Na 135 K+ 3.8 BUN 14 Cr 0.83  Hgb 12 WBC 6.5 plts 262 Lipids Tchol 172 Hdl 46 TR 90 LDL 018  BP 122/99 to 110/74 P now at 82 afebrile R 16.   Past Medical History:   Diagnosis Date   Cataract    HLD (hyperlipidemia)    Hypertension     Past Surgical History:  Procedure Laterality Date   COLONOSCOPY  11/19/2009   RMR: 1. Multiple small rectal polyps status post snare polypectomy. 2. Multiple left colon polyp status post snare removal . Poor prep compromised exam.    COLONOSCOPY N/A 12/13/2014   Procedure: COLONOSCOPY;  Surgeon: Daneil Dolin, MD;  [facility]: [facility];  Service: Endoscopy;  Laterality: N/A;  945   COLONOSCOPY N/A 07/18/2020   Procedure: COLONOSCOPY;  Surgeon: Daneil Dolin, MD;  Location: [facility];  Service: Endoscopy;  Laterality: N/A;  am   FLEXIBLE SIGMOIDOSCOPY N/A 04/25/2020   Procedure: FLEXIBLE SIGMOIDOSCOPY;  Surgeon: Daneil Dolin, MD; incomplete colonoscopy with flex sig only due to poor prep.   HERNIA REPAIR     POLYPECTOMY  07/18/2020   Procedure: POLYPECTOMY;  Surgeon: Daneil Dolin, MD;  Location: [facility];  Service: Endoscopy;;     Home Medications:  Prior to Admission medications   Medication Sig Start Date End Date Taking? Authorizing Provider  aspirin EC 81 MG tablet Take 1 tablet (81 mg total) by mouth  daily. Swallow whole. 09/26/21 12/20/22  Barton Dubois, MD  benzonatate (TESSALON) 100 MG capsule Take 1 capsule (100 mg total) by mouth every 8 (eight) hours as needed for cough. 09/26/21   Barton Dubois, MD  cloNIDine (CATAPRES) 0.1 MG tablet Take by mouth daily.  06/24/12   [provider]  clopidogrel (PLAVIX) 75 MG tablet Take 1 tablet (75 mg total) by mouth daily. 09/26/21 12/25/21  Barton Dubois, MD  ezetimibe (ZETIA) 10 MG tablet Take by mouth daily.     [provider]  metoprolol succinate (TOPROL-XL) 50 MG 24 hr tablet Take 50 mg by mouth daily. Take with or immediately following a meal.     [provider]  olmesartan (BENICAR) 20 MG tablet Take 20 mg by mouth daily.     [provider]  pantoprazole (PROTONIX) 40 MG tablet Take 1 tablet (40 mg total)  by mouth 2 (two) times daily. 09/26/21   Barton Dubois, MD  [place] 420 MG/3.5ML SOCT SMARTSIG:420 Milligram(s) SUB-Q Once a Month 11/09/19   [provider]  rosuvastatin (CRESTOR) 20 MG tablet Take 20 mg by mouth daily.     [provider]    Inpatient Medications: Scheduled Meds:  aspirin  81 mg Oral Daily   clopidogrel  75 mg Oral Daily   enoxaparin (LOVENOX) injection  40 mg Subcutaneous Daily   irbesartan  37.5 mg Oral Daily   metoprolol succinate  50 mg Oral Daily   pantoprazole  40 mg Oral Daily   rosuvastatin  20 mg Oral Daily   Continuous Infusions:  PRN Meds: acetaminophen, alum & mag hydroxide-simeth, benzonatate, bisacodyl, diphenhydrAMINE, guaiFENesin-dextromethorphan, magnesium hydroxide, methocarbamol, prochlorperazine **OR** prochlorperazine **OR** prochlorperazine, sodium phosphate, traZODone  Allergies:   No Known Allergies  Social History:   Social History   Socioeconomic History   Marital status: Married    Spouse name: Not on file   Number of children: Not on file   Years of education: Not on file   Highest education level: Not on file  Occupational History   Not on file  Tobacco Use   Smoking status: Former   Smokeless tobacco: Never  Vaping Use   Vaping Use: Never used  Substance and Sexual Activity   Alcohol use: Yes    Comment: Gin - twice per week; no Gin currently. Beer occasionally.    Drug use: No   Sexual activity: Not on file  Other Topics Concern   Not on file  Social History Narrative   Not on file   Social Determinants of Health   Financial Resource Strain: Not on file  Food Insecurity: Not on file  Transportation Needs: Not on file  Physical Activity: Not on file  Stress: Not on file  Social Connections: Not on file  Intimate Partner Violence: Not on file    Family History:    Family History  Problem Relation Age of Onset   Colon cancer Neg Hx    Stroke Mother    Heart attack Father       ROS:  Please see the history of present illness.  General:no colds or fevers, no weight changes Skin:no rashes or ulcers HEENT:no blurred vision, no congestion CV:see HPI PUL:see HPI GI:no diarrhea constipation or melena, no indigestion GU:no hematuria, no dysuria MS:no joint pain, no claudication Neuro:no syncope, no lightheadedness Endo:no diabetes, no thyroid disease  All other ROS reviewed and negative.     Physical Exam/Data:   Vitals:   10/07/21 0408 10/07/21  0920 10/07/21 1100 10/07/21 1300  BP: (!) 154/80 (!) 97/50 (!) 122/99 110/74  Pulse: 76 (!) 108 (!) 137 82  Resp: '18 16 16 16  '$ Temp: (!) 97.5 F (36.4 C) 98 F (36.7 C) 98 F (36.7 C) 97.7 F (36.5 C)  TempSrc: Oral Oral Oral Oral  SpO2: 95% 100% 100% 98%    Intake/Output Summary (Last 24 hours) at 10/07/2021 1352 Last data filed at 10/07/2021 1256 Gross per 24 hour  Intake 360 ml  Output 900 ml  Net -540 ml      09/25/2021   10:38 AM 09/25/2021    3:38 AM 07/18/2020    9:19 AM  Last 3 Weights  Weight (lbs) 154 lb 156 lb 8.4 oz 156 lb  Weight (kg) 69.854 kg 71 kg 70.761 kg     There is no height or weight on file to calculate BMI.  General:  thin male, in no acute distress, brief answers HEENT: normal Neck: no JVD Vascular: No carotid bruits; Distal pulses 2+ bilaterally Cardiac:  normal S1, S2; RRR; no murmur gallup rub or click Lungs:  clear to auscultation bilaterally, no wheezing, rhonchi or rales  Abd: soft, nontender, no hepatomegaly  Ext: no edema Musculoskeletal:  No deformities, BUE and BLE strength normal and equal Skin: warm and dry  Neuro: alert and oriented X 3 MAE follows commands, no focal abnormalities noted Psych:  Normal to flat affect    Relevant CV Studies: Echo 09/25/21 IMPRESSIONS     1. Left ventricular ejection fraction, by estimation, is 65 to 70%. The  left ventricle has normal function. The left ventricle has no regional  wall motion abnormalities. There is mild  asymmetric left ventricular  hypertrophy of the basal segment. Left  ventricular diastolic parameters were normal.   2. Right ventricular systolic function is normal. The right ventricular  size is normal. There is normal pulmonary artery systolic pressure. The  estimated right ventricular systolic pressure is 36.6 mmHg.   3. Left atrial size was mildly dilated.   4. The mitral valve is grossly normal. Trivial mitral valve  regurgitation.   5. The aortic valve is tricuspid. Aortic valve regurgitation is not  visualized. Aortic valve mean gradient measures 2.0 mmHg.   6. Aortic dilatation noted. There is mild dilatation of the aortic root,  measuring 40 mm.   7. The inferior vena cava is normal in size with greater than 50%  respiratory variability, suggesting right atrial pressure of 3 mmHg.   Comparison(s): No prior Echocardiogram.   FINDINGS   Left Ventricle: Left ventricular ejection fraction, by estimation, is 65  to 70%. The left ventricle has normal function. The left ventricle has no  regional wall motion abnormalities. The left ventricular internal cavity  size was normal in size. There is   mild asymmetric left ventricular hypertrophy of the basal segment. Left  ventricular diastolic parameters were normal.   Right Ventricle: The right ventricular size is normal. No increase in  right ventricular wall thickness. Right ventricular systolic function is  normal. There is normal pulmonary artery systolic pressure. The tricuspid  regurgitant velocity is 2.35 m/s, and   with an assumed right atrial pressure of 3 mmHg, the estimated right  ventricular systolic pressure is 44.0 mmHg.   Left Atrium: Left atrial size was mildly dilated.   Right Atrium: Right atrial size was normal in size.   Pericardium: There is no evidence of pericardial effusion.   Mitral Valve: The mitral valve is grossly  normal. Trivial mitral valve  regurgitation. MV peak gradient, 3.2 mmHg. The mean  mitral valve gradient  is 1.0 mmHg.   Tricuspid Valve: The tricuspid valve is grossly normal. Tricuspid valve  regurgitation is mild.   Aortic Valve: The aortic valve is tricuspid. There is mild aortic valve  annular calcification. Aortic valve regurgitation is not visualized.  Aortic valve mean gradient measures 2.0 mmHg. Aortic valve peak gradient  measures 3.9 mmHg. Aortic valve area, by   VTI measures 4.51 cm.   Pulmonic Valve: The pulmonic valve was grossly normal. Pulmonic valve  regurgitation is trivial.   Aorta: Aortic dilatation noted. There is mild dilatation of the aortic  root, measuring 40 mm.   Venous: The inferior vena cava is normal in size with greater than 50%  respiratory variability, suggesting right atrial pressure of 3 mmHg.   IAS/Shunts: No atrial level shunt detected by color flow Doppler.     carotid dopplers 09/26/21 IMPRESSION: 1. Minimal amount of bilateral atherosclerotic plaque, right subjectively greater than left, not resulting in a hemodynamically significant stenosis within either internal carotid artery. 2. Nonvisualization of the right vertebral artery, an age-indeterminate finding in the absence of prior examinations. 3. Patent left vertebral artery.      Laboratory Data:  High Sensitivity Troponin:  No results for input(s): TROPONINIHS in the last 720 hours.   Chemistry Recent Labs  Lab 10/02/21 0540 10/04/21 0547 10/07/21 0622  NA 137 136 135  K 3.8 4.2 3.8  CL 103 100 102  CO2 '26 30 28  '$ GLUCOSE 91 103* 104*  BUN '11 11 14  '$ CREATININE 0.83 0.96 0.83  CALCIUM 8.7* 8.8* 8.8*  GFRNONAA >60 >60 >60  ANIONGAP '8 6 5    '$ Recent Labs  Lab 10/02/21 0540  PROT 6.5  ALBUMIN 2.9*  AST 25  ALT 14  ALKPHOS 48  BILITOT 0.4   Lipids No results for input(s): CHOL, TRIG, HDL, LABVLDL, LDLCALC, CHOLHDL in the last 168 hours.  Hematology Recent Labs  Lab 10/01/21 0437 10/02/21 0540 10/07/21 0622  WBC 5.3 5.7 6.5  RBC 3.47* 3.66*  3.79*  HGB 10.8* 11.5* 12.0*  HCT 32.9* 34.4* 35.2*  MCV 94.8 94.0 92.9  MCH 31.1 31.4 31.7  MCHC 32.8 33.4 34.1  RDW 13.2 13.2 12.9  PLT 220 240 262   Thyroid No results for input(s): TSH, FREET4 in the last 168 hours.  BNPNo results for input(s): BNP, PROBNP in the last 168 hours.  DDimer No results for input(s): DDIMER in the last 168 hours.   Radiology/Studies:  No results found.   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  Also episodes of brady.    Is on toprol XL 50 - able to participate in rehab between episodes.   Check TSH.  Maintain K greater than 4, mag greater than 2.  Continue toprol XL, no room to increase given low resting HR.  Plan 30-day monitor on discharge to rule out A-fib as etiology of stroke and also to evaluate SVT burden. Recent CVA  on ASA and plavix HLD on statin continue HTN elevated here on BB and ARB Will try to keep in rehab unless more freq episodes.    Risk Assessment/Risk Scores:         For questions or updates, please contact [place] Please consult [email or web address] for contact info under    Signed, Cecilie Kicks, NP  10/07/2021 1:52 PM   Patient seen and  examined.  Agree with above documentation.  Javier [name] is a 76 year old male with a history of hypertension, hyperlipidemia, GERD, or recent CVA who we are consulted by Dr. Letta Pate for evaluation of SVT.  He is currently in CIR.  He was admitted on 09/25/2021 with dizziness and poor balance.  MRI brain showed cerebellar infarcts.  Neurology recommended 30-day event monitor as outpatient.  He had an episode of SVT during admission that resolved with IV metoprolol x1.  Echocardiogram 09/25/2021 showed EF 65 to 70%, normal RV function, no significant valvular disease.  He is on Toprol-XL 50 mg daily.  He was admitted to rehab on 10/01/2021.  Had episode of tachycardia today.  Not on telemetry.  EKG showed SVT, rate 147, LVH with reports repolarization  abnormalities, diffuse ST depressions.  He spontaneously converted to sinus rhythm.  Stated he could feel palpitations.  Vital signs show pulse 50s to 130s, BP 97/50 to 155/68, SPO2 98% on room air.  Labs notable for creatinine 0.8, hemoglobin 12.0, platelets 262, WBC 6.5.  On exam, patient is alert, regular rate and rhythm, no murmurs, lungs CTAB, no LE edema.  For his SVT, would continue Toprol-XL 50 mg daily, no room to increase given low resting heart rate.  Plan monitor on discharge.  Donato Heinz, MD

## 2021-10-07 NOTE — Progress Notes (Signed)
Physical Therapy Session Note  Patient Details  Name: Javier [name] MRN: [id number] Date of Birth: 1945-08-21  Today's Date: 10/07/2021 PT Individual Time: 1300-1340 PT Individual Time Calculation (min): 40 min   Short Term Goals: Week 1:  PT Short Term Goal 1 (Week 1): Pt will perform supine<>sit with CGA consistently PT Short Term Goal 2 (Week 1): Pt will perform sit<>stands using LRAD with CGA consistently PT Short Term Goal 3 (Week 1): Pt will perform bed<>chair transfers using LRAD with CGA consistently PT Short Term Goal 4 (Week 1): Pt will ambulate at least 123f using LRAD with CGA PT Short Term Goal 5 (Week 1): Pt will navigate 2 steps per home set-up with min assist Week 2:    Week 3:     Skilled Therapeutic Interventions/Progress Updates:    Nursing reports episode of tachycardia w/hypotension this am warranting cardiac consult. Nursing cleared pt for participation w/close monitoring of HR.  Supine to sit w/use of rail and supervision.  stand pivot transfer to wc w/min assist, cues for sequencing. Sit to stand to RW w/cga and cues for hand placement.  Gait trials 741fx 2 w/RW, cga,  Standing balance alternating arm raises w/cga, occasional single UE support utilized on RW, no balance loss but pt reports feeling unsteady. HR 96  Standing on airex pad, worked on stConservation officer, natureithout UE support - able to maintain up to 10 sec mult times w/increased sway but no lob.  HR 83  Single steps/alternating forward/back to target first w/bilat UE support w/cga then RUE support only w/min to light mod assist, ataxic LLE. HR 96  Cardiology arrived for consult.  Pt left oob in wc w/alarm belt set and needs in reach handed off to Cardiology for assessment.     Therapy Documentation Precautions:  Precautions Precautions: Fall, Other (comment) Precaution Comments: HOH, dysphagia diet, L hemibody ataxia Restrictions Weight Bearing Restrictions: No    Therapy/Group:  Individual Therapy BaCallie FieldingPT   BaJerrilyn Cairo/09/2021, 3:57 PM

## 2021-10-07 NOTE — Progress Notes (Deleted)
   10/07/21 1100  Assess: MEWS Score  Temp 98 F (36.7 C)  BP (!) 122/99  MAP (mmHg) 108  Pulse Rate (!) 137  Resp 16  Level of Consciousness Alert  SpO2 100 %  O2 Device Room Air  Patient Activity (if Appropriate) In bed  Assess: MEWS Score  MEWS Temp 0  MEWS Systolic 0  MEWS Pulse 3  MEWS RR 0  MEWS LOC 0  MEWS Score 3  MEWS Score Color Yellow  Assess: if the MEWS score is Yellow or Red  Were vital signs taken at a resting state? Yes  Focused Assessment No change from prior assessment  Does the patient meet 2 or more of the SIRS criteria? No  MEWS guidelines implemented *See Row Information* Yes  Treat  MEWS Interventions Other (Comment) (Dr. Letta Pate ordered cardiology consult)  Pain Scale 0-10  Pain Score 0  Take Vital Signs  Increase Vital Sign Frequency  Yellow: Q 2hr X 2 then Q 4hr X 2, if remains yellow, continue Q 4hrs  Escalate  MEWS: Escalate Yellow: discuss with charge nurse/RN and consider discussing with provider and RRT  Notify: Charge Nurse/RN  Name of Charge Nurse/RN Notified Angelina, RN  Date Charge Nurse/RN Notified 10/07/21  Time Charge Nurse/RN Notified 1122  Notify: Provider  Provider Name/Title Dr. Letta Pate  Date Provider Notified 10/07/21  Time Provider Notified 0930  Method of Notification Face-to-face  Notification Reason Other (Comment) (Increased HR)  Provider response See new orders;Other (Comment)  Date of Provider Response 10/07/21  Document  Progress note created (see row info) Yes  Assess: SIRS CRITERIA  SIRS Temperature  0  SIRS Pulse 1  SIRS Respirations  0  SIRS WBC 0  SIRS Score Sum  1

## 2021-10-07 NOTE — Progress Notes (Addendum)
   10/07/21 0920  Assess: MEWS Score  Temp 98 F (36.7 C)  BP (!) 97/50  Pulse Rate (!) 108  Resp 16  Level of Consciousness Alert  SpO2 100 %  O2 Device Room Air  Patient Activity (if Appropriate) In bed  Assess: MEWS Score  MEWS Temp 0  MEWS Systolic 1  MEWS Pulse 1  MEWS RR 0  MEWS LOC 0  MEWS Score 2  MEWS Score Color Yellow  Assess: if the MEWS score is Yellow or Red  Were vital signs taken at a resting state? Yes  Focused Assessment No change from prior assessment  Does the patient meet 2 or more of the SIRS criteria? No  MEWS guidelines implemented *See Row Information* Yes  Treat  MEWS Interventions Other (Comment) (Dr. Letta Pate)  Pain Scale 0-10  Pain Score 0  Take Vital Signs  Increase Vital Sign Frequency  Yellow: Q 2hr X 2 then Q 4hr X 2, if remains yellow, continue Q 4hrs  Escalate  MEWS: Escalate Yellow: discuss with charge nurse/RN and consider discussing with provider and RRT  Notify: Charge Nurse/RN  Name of Charge Nurse/RN Notified Angelina, RN  Date Charge Nurse/RN Notified 10/07/21  Time Charge Nurse/RN Notified 0930  Notify: Provider  Provider Name/Title Dr. Letta Pate  Date Provider Notified 10/07/21  Time Provider Notified 0930  Method of Notification Face-to-face  Notification Reason Change in status  Provider response See new orders;At bedside  Date of Provider Response 10/07/21  Time of Provider Response 0930  Document  Patient Outcome Stabilized after interventions  Progress note created (see row info) Yes  Assess: SIRS CRITERIA  SIRS Temperature  0  SIRS Pulse 1  SIRS Respirations  0  SIRS WBC 0  SIRS Score Sum  1

## 2021-10-08 ENCOUNTER — Inpatient Hospital Stay (HOSPITAL_COMMUNITY): Payer: Medicare Other

## 2021-10-08 DIAGNOSIS — I639 Cerebral infarction, unspecified: Secondary | ICD-10-CM | POA: Diagnosis not present

## 2021-10-08 DIAGNOSIS — I471 Supraventricular tachycardia: Secondary | ICD-10-CM | POA: Diagnosis not present

## 2021-10-08 DIAGNOSIS — I10 Essential (primary) hypertension: Secondary | ICD-10-CM | POA: Diagnosis not present

## 2021-10-08 NOTE — Progress Notes (Signed)
Occupational Therapy Session Note  Patient Details  Name: Javier [name] MRN: [id number] Date of Birth: 1945/10/19  Today's Date: 10/08/2021 OT Individual Time: 9528-4132 OT Individual Time Calculation (min): 26 min  Pt seen for missed minutes   Short Term Goals: Week 1:  OT Short Term Goal 1 (Week 1): Pt will complete stand pivot toilet transfer with supervision OT Short Term Goal 2 (Week 1): Pt will complete donn/doff pants with supervision OT Short Term Goal 3 (Week 1): Pt will bathe LB with CGA OT Short Term Goal 4 (Week 1): Pt will increase shoulder FF/abd to 4-/5 MMT  Skilled Therapeutic Interventions/Progress Updates:  Pt greeted seated in w/c agreeable to OT intervention to make up for missed minutes. Pt transported to day room where pt engaged in various therapeutic activities focused on challenging dynamic balance. Pt completed functional mobility obstacle course where pt instructed to use rw to weave in between cones to practice managing AD in tight spaces, pt noted to have difficulty keeping RW on floor during short transitions d/t LUE ataxia. Pt completed task again with Rw weighted down with great improvement in AD stability. Next, pt engaged in standing dynamic balance tasks where pt instructed to stand with RW with CGA and toss ball with no UE support, pt completed task with overall CGA- MINA, noted fair BUE control during GM tasks. Pt transported back to room in w/c with total A where pt left seated in w/c with alarm belt activated and all needs within reach.              Therapy Documentation Precautions:  Precautions Precautions: Fall, Other (comment) Precaution Comments: HOH, dysphagia diet, L hemibody ataxia Restrictions Weight Bearing Restrictions: No  Pain: no pain reported during session    Therapy/Group: Individual Therapy  Precious Haws 10/08/2021, 3:35 PM

## 2021-10-08 NOTE — Progress Notes (Signed)
PROGRESS NOTE   Subjective/Complaints:   Appreciate cardiology note , pt states he feels better today , in PT , has had dry nagging cough since admit  ROS: Limited by cognition. Denies fever, pain, SOB, headache, N/V/D.   Objective:   No results found. Recent Labs    10/07/21 0622  WBC 6.5  HGB 12.0*  HCT 35.2*  PLT 262   Recent Labs    10/07/21 0622  NA 135  K 3.8  CL 102  CO2 28  GLUCOSE 104*  BUN 14  CREATININE 0.83  CALCIUM 8.8*     Intake/Output Summary (Last 24 hours) at 10/08/2021 0842 Last data filed at 10/08/2021 0508 Gross per 24 hour  Intake 480 ml  Output 725 ml  Net -245 ml         Physical Exam: Vital Signs Blood pressure 138/69, pulse 60, temperature 97.8 F (36.6 C), resp. rate 16, SpO2 97 %.   General: No acute distress Mood and affect are appropriate Heart: Regular rate and rhythm no rubs murmurs or extra sounds Lungs: Clear to auscultation, breathing unlabored, no rales or wheezes Abdomen: Positive bowel sounds, soft nontender to palpation, nondistended Extremities: No clubbing, cyanosis, or edema Skin: No evidence of breakdown, no evidence of rash Neurologic: Cranial nerves II through XII intact, motor strength is 5/5 in bilateral deltoid, bicep, tricep, grip, hip flexor, knee extensors, ankle dorsiflexor and plantar flexor Sensory exam normal sensation to light touch and proprioception in bilateral upper and lower extremities Cerebellar exam Ataxia Moderate Left FNF, Left H--> S Musculoskeletal: Full range of motion in all 4 extremities. No joint swelling   Musculoskeletal: FROM all 4 extremities. Absence of joint swelling. Note: PIV removed.   Assessment/Plan: 1. Functional deficits which require 3+ hours per day of interdisciplinary therapy in a comprehensive inpatient rehab setting. Physiatrist is providing close team supervision and 24 hour management of active medical  problems listed below. Physiatrist and rehab team continue to assess barriers to discharge/monitor patient progress toward functional and medical goals  Care Tool:  Bathing    Body parts bathed by patient: Right arm, Left arm, Chest, Abdomen, Front perineal area, Buttocks, Right upper leg, Left upper leg, Face         Bathing assist Assist Level: Minimal Assistance - Patient > 75%     Upper Body Dressing/Undressing Upper body dressing   What is the patient wearing?: Pull over shirt    Upper body assist Assist Level: Set up assist    Lower Body Dressing/Undressing Lower body dressing      What is the patient wearing?: Underwear/pull up, Pants     Lower body assist Assist for lower body dressing: Minimal Assistance - Patient > 75%     Toileting Toileting    Toileting assist Assist for toileting: Minimal Assistance - Patient > 75%     Transfers Chair/bed transfer  Transfers assist     Chair/bed transfer assist level: Contact Guard/Touching assist Chair/bed transfer assistive device: Programmer, multimedia   Ambulation assist   Ambulation activity did not occur: Safety/medical concerns (Pt did not feel safe attempting gait unless using RW, which he did not use  at baseline)  Assist level: Minimal Assistance - Patient > 75% Assistive device: Walker-rolling Max distance: 112f   Walk 10 feet activity   Assist  Walk 10 feet activity did not occur: Safety/medical concerns        Walk 50 feet activity   Assist Walk 50 feet with 2 turns activity did not occur: Safety/medical concerns         Walk 150 feet activity   Assist Walk 150 feet activity did not occur: Safety/medical concerns         Walk 10 feet on uneven surface  activity   Assist Walk 10 feet on uneven surfaces activity did not occur: Safety/medical concerns         Wheelchair     Assist Is the patient using a wheelchair?: Yes (for transport to/from gym)       Wheelchair assist level: Dependent - Patient 0%      Wheelchair 50 feet with 2 turns activity    Assist        Assist Level: Dependent - Patient 0%   Wheelchair 150 feet activity     Assist      Assist Level: Dependent - Patient 0%   Blood pressure 138/69, pulse 60, temperature 97.8 F (36.6 C), resp. rate 16, SpO2 97 %.  Medical Problem List and Plan: 1. 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 Left cerebellar infarct extends into dorsal left midbrain onset 09/25/21.             -patient may  shower             -ELOS/Goals: 12-14 days, supervision goals with PT, OT, SLP             - Continue CIR PT/OT/SLP             -team conf in am  2.  Antithrombotics: -DVT/anticoagulation:  Pharmaceutical: Lovenox             -antiplatelet therapy: Plavix and aspirin for 3 months then aspirin alone 3. Pain Management: Tylenol as needed 4. Mood: LCSW to evaluate and provide emotional support             -antipsychotic agents: n/a 5. Neuropsych: This patient is capable of making decisions on his own behalf. 6. Skin/Wound Care: Routine skin care checks 7. Fluids/Electrolytes/Nutrition: Routine Is and Os and follow-up chemistries             -dys 3 diet with nectar; crush meds 8: Dysphagia; continue dysphagia 3 diet with nectar thick liquids             --Continue SLP, currently Dys3 with Nectar thick liquids, he is eating most meals             -Recheck BMP labs tomorrow, advise fluids intake              -Cr and BUN stable, continue to monitor 6/3 BUN 11, Crt 0.96 6/4 Follow with qMonday labs    Latest Ref Rng & Units 10/07/2021    6:22 AM 10/04/2021    5:47 AM 10/02/2021    5:40 AM  BMP  Glucose 70 - 99 mg/dL 104   103   91    BUN 8 - 23 mg/dL '14   11   11    '$ Creatinine 0.61 - 1.24 mg/dL 0.83   0.96   0.83    Sodium 135 - 145 mmol/L 135   136   137  Potassium 3.5 - 5.1 mmol/L 3.8   4.2   3.8    Chloride 98 - 111 mmol/L 102   100   103     CO2 22 - 32 mmol/L '28   30   26    '$ Calcium 8.9 - 10.3 mg/dL 8.8   8.8   8.7    9: Hypertension: continue metoprolol 50 mg daily             --home Catapres and Benicar held and not restarted    10/08/2021    5:08 AM 10/07/2021    9:05 PM 10/07/2021    5:00 PM  Vitals with BMI  Systolic 818 563 149  Diastolic 69 70 75  Pulse 60 54 78   -6/2 A little elevated BP, will start a low dose irbesartan 6/3 SBP somewhat labile 138-166 today, continue 50 mg metoprolol daily along with irbesartan 37.5 mg daily 6/4 So far SBP still elevated 140's-150's.  Consider dose escalation if still elevated. 10: Hyperlipidemia: continue Crestor 20 mg daily             --home Zetia and Repatha held not restarted 11: Prediabetes: random serum glucoses and CBGs within range --follow-up/monitor with PCP -glucose values appear good overall 12: GERD: continue Protonix 40 mg daily 13: Hypokalemia: recheck BMP improved , monitor weekly              -has received supplement             -6/2 K+wnl 6/4 Trend with qMonday labs 14: Anemia: not symptomatic; follow-up CBC 6/1 improved to 11.5 6/4 Trend with qMonday labs.    Latest Ref Rng & Units 10/07/2021    6:22 AM 10/02/2021    5:40 AM 10/01/2021    4:37 AM  CBC  WBC 4.0 - 10.5 K/uL 6.5   5.7   5.3    Hemoglobin 13.0 - 17.0 g/dL 12.0   11.5   10.8    Hematocrit 39.0 - 52.0 % 35.2   34.4   32.9    Platelets 150 - 400 K/uL 262   240   220      15. Episodes of SVT--responded to IV metoprolol x 1 dose             -f/u EKG confirmed NSR             -continue metoprolol '50mg'$  daily as bove. Appreciate cardiology consult    16.  Dry cough , not on ACE-I , no wheezing, no WBC elevation , check CXR, former smoker  LOS: 7 days A FACE TO FACE EVALUATION WAS PERFORMED  Charlett Blake 10/08/2021, 8:42 AM

## 2021-10-08 NOTE — Progress Notes (Signed)
Progress Note  Patient Name: Javier [name] Date of Encounter: 10/08/2021  [facility] Cardiologist: Donato Heinz, MD   Subjective   Denies any chest pain, dyspnea, palpitations  Inpatient Medications    Scheduled Meds:  aspirin  81 mg Oral Daily   clopidogrel  75 mg Oral Daily   enoxaparin (LOVENOX) injection  40 mg Subcutaneous Daily   irbesartan  37.5 mg Oral Daily   metoprolol succinate  50 mg Oral Daily   pantoprazole  40 mg Oral Daily   rosuvastatin  20 mg Oral Daily   Continuous Infusions:  PRN Meds: acetaminophen, alum & mag hydroxide-simeth, benzonatate, bisacodyl, diphenhydrAMINE, guaiFENesin-dextromethorphan, magnesium hydroxide, methocarbamol, prochlorperazine **OR** prochlorperazine **OR** prochlorperazine, sodium phosphate, traZODone   Vital Signs    Vitals:   10/07/21 1300 10/07/21 1700 10/07/21 2105 10/08/21 0508  BP: 110/74 119/75 (!) 144/70 138/69  Pulse: 82 78 (!) 54 60  Resp: '16 18 18 16  '$ Temp: 97.7 F (36.5 C) 97.8 F (36.6 C) (!) 97.4 F (36.3 C) 97.8 F (36.6 C)  TempSrc: Oral Oral Oral   SpO2: 98% 100% 100% 97%    Intake/Output Summary (Last 24 hours) at 10/08/2021 1142 Last data filed at 10/08/2021 0830 Gross per 24 hour  Intake 716 ml  Output 725 ml  Net -9 ml      09/25/2021   10:38 AM 09/25/2021    3:38 AM 07/18/2020    9:19 AM  Last 3 Weights  Weight (lbs) 154 lb 156 lb 8.4 oz 156 lb  Weight (kg) 69.854 kg 71 kg 70.761 kg      Telemetry    Not on telemetry- Personally Reviewed  ECG    No new ECG- Personally Reviewed  Physical Exam   GEN: No acute distress.   Neck: No JVD Cardiac: RRR, no murmurs, rubs, or gallops.  Respiratory: Clear to auscultation bilaterally. GI: Soft, nontender, non-distended  MS: No edema; No deformity. Neuro:  Nonfocal  Psych: Normal affect   Labs    High Sensitivity Troponin:  No results for input(s): TROPONINIHS in the last 720 hours.   Chemistry Recent Labs  Lab  10/02/21 0540 10/04/21 0547 10/07/21 0622 10/07/21 1631  NA 137 136 135  --   K 3.8 4.2 3.8  --   CL 103 100 102  --   CO2 '26 30 28  '$ --   GLUCOSE 91 103* 104*  --   BUN '11 11 14  '$ --   CREATININE 0.83 0.96 0.83  --   CALCIUM 8.7* 8.8* 8.8*  --   MG  --   --   --  1.7  PROT 6.5  --   --   --   ALBUMIN 2.9*  --   --   --   AST 25  --   --   --   ALT 14  --   --   --   ALKPHOS 48  --   --   --   BILITOT 0.4  --   --   --   GFRNONAA >60 >60 >60  --   ANIONGAP '8 6 5  '$ --     Lipids No results for input(s): CHOL, TRIG, HDL, LABVLDL, LDLCALC, CHOLHDL in the last 168 hours.  Hematology Recent Labs  Lab 10/02/21 0540 10/07/21 0622  WBC 5.7 6.5  RBC 3.66* 3.79*  HGB 11.5* 12.0*  HCT 34.4* 35.2*  MCV 94.0 92.9  MCH 31.4 31.7  MCHC 33.4 34.1  RDW  13.2 12.9  PLT 240 262   Thyroid  Recent Labs  Lab 10/07/21 1631  TSH 1.710    BNPNo results for input(s): BNP, PROBNP in the last 168 hours.  DDimer No results for input(s): DDIMER in the last 168 hours.   Radiology    No results found.  Cardiac Studies   Echo 09/25/21:  1. Left ventricular ejection fraction, by estimation, is 65 to 70%. The  left ventricle has normal function. The left ventricle has no regional  wall motion abnormalities. There is mild asymmetric left ventricular  hypertrophy of the basal segment. Left  ventricular diastolic parameters were normal.   2. Right ventricular systolic function is normal. The right ventricular  size is normal. There is normal pulmonary artery systolic pressure. The  estimated right ventricular systolic pressure is 38.7 mmHg.   3. Left atrial size was mildly dilated.   4. The mitral valve is grossly normal. Trivial mitral valve  regurgitation.   5. The aortic valve is tricuspid. Aortic valve regurgitation is not  visualized. Aortic valve mean gradient measures 2.0 mmHg.   6. Aortic dilatation noted. There is mild dilatation of the aortic root,  measuring 40 mm.   7. The  inferior vena cava is normal in size with greater than 50%  respiratory variability, suggesting right atrial pressure of 3 mmHg.   Patient Profile     76 y.o. male with a hx of HTN, HLD, GERD, ETOH use disorder admitted 09/25/21 with dizziness and poor balance dx with CVA who is being seen 10/07/2021 for the evaluation of tachycardia  Assessment & Plan    SVT: has had 3 episodes since admission.  Rates to 150s, self terminating.  TSH normal.  Echo shows normal biventricular function, no significant valvular disease. -Continue toprol XL 50 mg, no room to increase given low resting HR.   -Maintain K greater than 4, mag greater than 2 -Plan 30-day monitor on discharge to rule out A-fib as etiology of stroke and also to evaluate SVT burden.  CVA  on ASA and plavix, rosuvastatin.   -Plan 30-day monitor on discharge  Hypertension: On irbesartan and metoprolol  [facility] will sign off.   Medication Recommendations: Continue current meds Other recommendations (labs, testing, etc): Will plan 30-day monitor on discharge.  Please let us know when patient is being discharged we will arrange Follow up as an outpatient: Will schedule   For questions or updates, please contact [place] Please consult [email or web address] for contact info under        Signed, Donato Heinz, MD  10/08/2021, 11:42 AM

## 2021-10-08 NOTE — Progress Notes (Signed)
Physical Therapy Session Note  Patient Details  Name: Javier [name] MRN: [id number] Date of Birth: 03-Jan-1946  Today's Date: 10/08/2021 PT Individual Time: 2536-6440 and 3474-2595 PT Individual Time Calculation (min): 70 min and 25 min  Short Term Goals: Week 1:  PT Short Term Goal 1 (Week 1): Pt will perform supine<>sit with CGA consistently PT Short Term Goal 2 (Week 1): Pt will perform sit<>stands using LRAD with CGA consistently PT Short Term Goal 3 (Week 1): Pt will perform bed<>chair transfers using LRAD with CGA consistently PT Short Term Goal 4 (Week 1): Pt will ambulate at least 121f using LRAD with CGA PT Short Term Goal 5 (Week 1): Pt will navigate 2 steps per home set-up with min assist  Skilled Therapeutic Interventions/Progress Updates:    Session 1: Pt received sitting in w/c and agreeable to therapy session. Pt states "I feel a whole lot better than yesterday." Vitals sitting in w/c at beginning of session BP 138/74 (MAP 92), HR 52-55bpm, SpO2 95%  Donned tennis shoes total assist for time.  Transported to/from gym in w/c for time management and energy conservation.  Sit<>stands using RW with CGA for steadying during session. Gait training ~764fx2 using RW with CGA/light min assist for steadying - continues to have heavy L heel strike on initial contact with overall mild truncal ataxia and increased back/forth movement of AD due to L UE ataxia.  Stair navigation training ascending/descending 4steps (6" height) x2 using B HRs progressing closer to more consistent CGA but relies heavily on B UE support on HRs to maintain balance due to mild/moderate ataxia - reciprocal stepping pattern.  Donned maxi-sky harness.  Gait training in maxi-sky harness for safety and pt comfort ~607fith L HHA progressed to ~84f20fthout UE support with min assist for balance walking forward and light mod assist when turning due to L LE ataxia with overshooting - able to achieve reciprocal  stepping pattern and demos improved upright posture when gait training without RW although increased assist needed to maintain balance  Repeated sit<>stands x10 to/from EOM without UE support and focusing on not pushing back of legs onto mat with CGA/light min assist for balance - pt frequently reaching out to hold onto therapist for support despite cuing to perform without UE support - continues to have impaired force regulation with mild truncal ataxia.  Dynamic stepping balance task in maxi-sky harness via forward/backwards stepping over hockey stick with min assist for balance and pt demoing overshooting when stepping back with L LE and pt reporting this task as very challenging and he didn't feel he could be successful with it  Monitored HR throughout session and maintained in around 50bpms..   Gait training ~60ft61fin in maxi-sky harness with L HHA progressed to no HHA with continued min assist for balance - demos worsening ataxia with wider BOS and more inconsistent L LE stepping/placement due to pt appearing distressed stating it is really hard for him to do this.  While performing dynamic gait and balance tasks in maxi-sky harness pt repeatedly stating "it's hard" and appearing distressed by how challenging these tasks are, including even just static standing without UE support. Therapist provided pt emotional support and education on importance of challenging his balance to improve his safety and independence with mobility.  Doffed maxi-sky harness.   Dynamic standing balance task without UE support of performing cross body reaching of moving clothespins from basketball goal to clothespin line using LUE for NMR - requires min assist  for balance and frequent hand-over-hand assist to manage clothespin - pt repeatedly stating "it's hard" "I don't like this" though continues with the task until complete.  Pt overall appears more distressed today becoming upset with tasks that challenge his  ataxia and balance. Gait training ~158f back to room using RW with pt progressing towards more consistent CGA compared to light min assist - continues to have jerky, ataxic movements. At end of session, pt left seated in w/c with needs in reach and seat belt alarm on.   Session 2: Pt received sitting in w/c and agreeable to therapy session. Reports need to use bathroom. Sit<>stands using RW with CGA for steadying during session. Gait training ~15-235fx2 in/out bathroom using RW with CGA for steadying and verbal cuing for safe AD management - continues to have mild truncal and L hemibody ataxia. Standing with CGA/light min assist for balance and pt alternating UE support on RW and grab bar as pt does not feel safe taking both hands off of a support to perform LB clothing management. Continent if bladder and bowels and performed peri-care with close supervision for safety due to truncal ataxia while using UE support on grab bar. Standing hand hygiene at sink with max verbal cuing for safe AD management at counter and then close supervision for balance safety with pt using front of hips on counter to maintain his balance. Gait training ~14523f2 to/from day room using RW with continued CGA/light min assist for balance - initially has excessive anterior trunk lean pushing heavy down through AD but improves at end of session - continues to have heavy L heel landing on initial contact, increased back and forth movement of AD due to L UE ataxia, and mild truncal ataxia. Dynamic standing balance task of stepping forward/backwards onto targets using L HHA with min assist for balance - pt requires a few small steps to get to target rather than goal of doing it in 1 large step. Pt continues to report activities without B UE support being "hard" for him to do. At end of session, pt left seated in w/c with needs in reach and seat belt alarm on.   Therapy Documentation Precautions:  Precautions Precautions: Fall, Other  (comment) Precaution Comments: HOH, dysphagia diet, L hemibody ataxia Restrictions Weight Bearing Restrictions: No   Pain:  Session 1: No reports of pain throughout session.  Session 2: No reports of pain throughout session.    Therapy/Group: Individual Therapy  CarTawana ScalePT, DPT, NCS, CSRS 10/08/2021, 7:57 AM

## 2021-10-08 NOTE — Progress Notes (Signed)
Speech Language Pathology Daily Session Note  Patient Details  Name: Javier [name] MRN: [id number] Date of Birth: [birth year]  Today's Date: 10/08/2021 SLP Individual Time: 1520-1600 SLP Individual Time Calculation (min): 40 min  Short Term Goals: Week 1: SLP Short Term Goal 1 (Week 1): Pt will demonstrate selective attention to functional tasks for 10 minute duration with mod A verbal redirection SLP Short Term Goal 2 (Week 1): Pt will recall novel information with mod A for use of compensatory memory strategies to achieve 50% accuracy SLP Short Term Goal 3 (Week 1): Pt will complete basic problem solving with mod A verbal/visual cues to achieve 50% accuracy SLP Short Term Goal 4 (Week 1): Pt will demonstrate inellectual awareness of deficits by verbalizing at least 1 physical and 1 cognitive impairment/change s/p CVA with moderate verbal question/contextual cues SLP Short Term Goal 5 (Week 1): Patient will consume current diet with minimal overt s/sx of aspiration with min A for implementation of safe swallowing precautions/strategies SLP Short Term Goal 6 (Week 1): Pt will consume thin liquids with minimal overt s/sx of aspiration and min A cues to take small/single sips across 2-3 tx sessions prior to advancing liquids  Skilled Therapeutic Interventions: Pt seen for skilled ST with focus on cognitive and swallowing goals, pt upright in wheelchair with significant and almost non-stop coughing upon entry. Pt unable to cease coughing enough for any safe PO during this episode. Coughing was productive, pt soaking multiple rags and paper towels. RN notified and administered cough medication which she reports was effective yesterday evening. Pt did eventually stop coughing and was able to consume 8oz NTL via straw with only 1 delayed throat clear at end of intake. SLP facilitating functional problem solving for use of call button in room to increase safety and for pt to utilize resources available  during hospitalization (nursing staff for assist, liquids when thirsty, etc). Pt required mod A cues for understanding and carryover of information. Pt with a few episodes of agitation when SLP having difficulty understanding what patient was expressing or when patient had trouble understanding SLP, verbal cues effective to redirect. Pt left in wheelchair with all needs within reach, cont ST POC.  Pain Pain Assessment Pain Scale: 0-10 Pain Score: 0-No pain  Therapy/Group: Individual Therapy  Dewaine Conger 10/08/2021, 3:52 PM

## 2021-10-08 NOTE — Progress Notes (Signed)
Occupational Therapy Session Note  Patient Details  Name: Javier [name] MRN: [id number] Date of Birth: 11-13-1945  Today's Date: 10/08/2021 OT Individual Time: 8299-3716 OT Individual Time Calculation (min): 38 min    Short Term Goals: Week 1:  OT Short Term Goal 1 (Week 1): Pt will complete stand pivot toilet transfer with supervision OT Short Term Goal 2 (Week 1): Pt will complete donn/doff pants with supervision OT Short Term Goal 3 (Week 1): Pt will bathe LB with CGA OT Short Term Goal 4 (Week 1): Pt will increase shoulder FF/abd to 4-/5 MMT  Skilled Therapeutic Interventions/Progress Updates:  Pt greeted  seated in w/c  agreeable to OT intervention. ADL needs had already been met. Pt transported to ADL apt with total A for time mgmt. Pt completed furniture transfers via functional ambulation to flat bed, couch and recliner with RW and MIN A - CGA. + difficulty standing from low couch but able to do better after second attempt. CGA for sit>supine from flat HOB.  Discussed recommendation of not getting down in tub as recommendation of completing sink baths as shower is unavailable.    Pt completed dynamic reaching task in pantry where pt instructed to reach out of BOS with LUE to retrieve items in pantry with LUE. Noted + difficult locating items when named offered such as "smart made", pt would ask "is it this one?" For every option given, suspect more of baseline cog deficits vs visual deficit. Pt reports urgency to void bladder.  Total A transport to bathroom with pt able to stand with CGA to urinate with CGA while pt holding onto grab bars. [place] for clothing mgmt during toileting.  Pt completed functional ambulation back to room with Rw and CGA. Pt left seated in w/c with alarm belt activated and all needs within reach.   HR 46-50 bpm                   Therapy Documentation Precautions:  Precautions Precautions: Fall, Other (comment) Precaution Comments: HOH, dysphagia diet, L  hemibody ataxia Restrictions Weight Bearing Restrictions: No  Pain:  no pain reported during session     Therapy/Group: Individual Therapy  Corinne Ports [facility] 10/08/2021, 12:22 PM

## 2021-10-09 ENCOUNTER — Other Ambulatory Visit: Payer: Self-pay

## 2021-10-09 ENCOUNTER — Inpatient Hospital Stay (HOSPITAL_COMMUNITY)
Admission: AD | Admit: 2021-10-09 | Discharge: 2021-10-15 | DRG: 309 | Disposition: A | Discharge status: Home Health | Payer: Medicare Other | Source: Ambulatory Visit | Attending: Internal Medicine | Admitting: Internal Medicine

## 2021-10-09 ENCOUNTER — Encounter (HOSPITAL_COMMUNITY): Payer: Self-pay | Admitting: Internal Medicine

## 2021-10-09 DIAGNOSIS — I10 Essential (primary) hypertension: Secondary | ICD-10-CM | POA: Diagnosis present

## 2021-10-09 DIAGNOSIS — Z8249 Family history of ischemic heart disease and other diseases of the circulatory system: Secondary | ICD-10-CM | POA: Diagnosis not present

## 2021-10-09 DIAGNOSIS — E440 Moderate protein-calorie malnutrition: Secondary | ICD-10-CM | POA: Diagnosis present

## 2021-10-09 DIAGNOSIS — I69393 Ataxia following cerebral infarction: Secondary | ICD-10-CM | POA: Diagnosis not present

## 2021-10-09 DIAGNOSIS — R1310 Dysphagia, unspecified: Secondary | ICD-10-CM | POA: Diagnosis present

## 2021-10-09 DIAGNOSIS — Z823 Family history of stroke: Secondary | ICD-10-CM | POA: Diagnosis not present

## 2021-10-09 DIAGNOSIS — Z7982 Long term (current) use of aspirin: Secondary | ICD-10-CM

## 2021-10-09 DIAGNOSIS — K5730 Diverticulosis of large intestine without perforation or abscess without bleeding: Secondary | ICD-10-CM

## 2021-10-09 DIAGNOSIS — Z79899 Other long term (current) drug therapy: Secondary | ICD-10-CM | POA: Diagnosis not present

## 2021-10-09 DIAGNOSIS — I471 Supraventricular tachycardia, unspecified: Secondary | ICD-10-CM | POA: Diagnosis present

## 2021-10-09 DIAGNOSIS — E785 Hyperlipidemia, unspecified: Secondary | ICD-10-CM | POA: Diagnosis present

## 2021-10-09 DIAGNOSIS — Z6821 Body mass index (BMI) 21.0-21.9, adult: Secondary | ICD-10-CM

## 2021-10-09 DIAGNOSIS — I69991 Dysphagia following unspecified cerebrovascular disease: Secondary | ICD-10-CM

## 2021-10-09 DIAGNOSIS — K219 Gastro-esophageal reflux disease without esophagitis: Secondary | ICD-10-CM | POA: Diagnosis present

## 2021-10-09 DIAGNOSIS — Z7902 Long term (current) use of antithrombotics/antiplatelets: Secondary | ICD-10-CM

## 2021-10-09 DIAGNOSIS — D649 Anemia, unspecified: Secondary | ICD-10-CM | POA: Diagnosis not present

## 2021-10-09 DIAGNOSIS — F1010 Alcohol abuse, uncomplicated: Secondary | ICD-10-CM | POA: Diagnosis present

## 2021-10-09 DIAGNOSIS — Z86010 Personal history of colonic polyps: Secondary | ICD-10-CM

## 2021-10-09 DIAGNOSIS — I440 Atrioventricular block, first degree: Secondary | ICD-10-CM | POA: Diagnosis present

## 2021-10-09 DIAGNOSIS — I639 Cerebral infarction, unspecified: Secondary | ICD-10-CM | POA: Diagnosis not present

## 2021-10-09 DIAGNOSIS — I69391 Dysphagia following cerebral infarction: Secondary | ICD-10-CM | POA: Diagnosis not present

## 2021-10-09 DIAGNOSIS — Z87891 Personal history of nicotine dependence: Secondary | ICD-10-CM

## 2021-10-09 DIAGNOSIS — R001 Bradycardia, unspecified: Secondary | ICD-10-CM | POA: Diagnosis present

## 2021-10-09 MED ORDER — ENOXAPARIN SODIUM 40 MG/0.4ML IJ SOSY: 40.0000 mg | PREFILLED_SYRINGE | Freq: Every day | INTRAMUSCULAR | Status: DC

## 2021-10-09 MED ORDER — ENOXAPARIN SODIUM 40 MG/0.4ML IJ SOSY
40.0000 mg | PREFILLED_SYRINGE | INTRAMUSCULAR | Status: DC
  Administered 2021-10-09 – 2021-10-14 (×6): 40 mg via SUBCUTANEOUS
  Filled 2021-10-09 (×6): qty 0.4

## 2021-10-09 MED ORDER — ONDANSETRON HCL 4 MG PO TABS: 4.0000 mg | ORAL_TABLET | Freq: Four times a day (QID) | ORAL | Status: DC | PRN

## 2021-10-09 MED ORDER — OXYCODONE HCL 5 MG PO TABS: 5.0000 mg | ORAL_TABLET | ORAL | Status: DC | PRN

## 2021-10-09 MED ORDER — ACETAMINOPHEN 650 MG RE SUPP: 650.0000 mg | Freq: Four times a day (QID) | RECTAL | Status: DC | PRN

## 2021-10-09 MED ORDER — MORPHINE SULFATE (PF) 2 MG/ML IV SOLN: 2.0000 mg | INTRAVENOUS | Status: DC | PRN

## 2021-10-09 MED ORDER — METHOCARBAMOL 500 MG PO TABS
500.0000 mg | ORAL_TABLET | Freq: Four times a day (QID) | ORAL | Status: DC | PRN
  Filled 2021-10-09: qty 1

## 2021-10-09 MED ORDER — METHOCARBAMOL 500 MG PO TABS: 500.0000 mg | ORAL_TABLET | Freq: Four times a day (QID) | ORAL | Status: AC | PRN

## 2021-10-09 MED ORDER — AMIODARONE HCL 200 MG PO TABS
200.0000 mg | ORAL_TABLET | Freq: Two times a day (BID) | ORAL | Status: DC
  Administered 2021-10-09: 200 mg via ORAL
  Filled 2021-10-09: qty 1

## 2021-10-09 MED ORDER — ONDANSETRON HCL 4 MG/2ML IJ SOLN: 4.0000 mg | Freq: Four times a day (QID) | INTRAMUSCULAR | Status: DC | PRN

## 2021-10-09 MED ORDER — CLOPIDOGREL BISULFATE 75 MG PO TABS
75.0000 mg | ORAL_TABLET | Freq: Every day | ORAL | Status: DC
  Administered 2021-10-10 – 2021-10-15 (×6): 75 mg via ORAL
  Filled 2021-10-09 (×6): qty 1

## 2021-10-09 MED ORDER — POLYETHYLENE GLYCOL 3350 17 G PO PACK
17.0000 g | PACK | Freq: Every day | ORAL | Status: DC | PRN
Start: 2021-10-09 — End: 2021-10-15

## 2021-10-09 MED ORDER — ACETAMINOPHEN 325 MG PO TABS: 650.0000 mg | ORAL_TABLET | Freq: Four times a day (QID) | ORAL | Status: DC | PRN

## 2021-10-09 MED ORDER — ASPIRIN 81 MG PO TBEC
81.0000 mg | DELAYED_RELEASE_TABLET | Freq: Every day | ORAL | Status: DC
  Administered 2021-10-10 – 2021-10-15 (×6): 81 mg via ORAL
  Filled 2021-10-09 (×6): qty 1

## 2021-10-09 MED ORDER — MAGNESIUM SULFATE IN D5W 1-5 GM/100ML-% IV SOLN: 1.0000 g | Freq: Once | INTRAVENOUS | Status: DC

## 2021-10-09 MED ORDER — ROSUVASTATIN CALCIUM 20 MG PO TABS
20.0000 mg | ORAL_TABLET | Freq: Every day | ORAL | Status: DC
  Administered 2021-10-10 – 2021-10-15 (×6): 20 mg via ORAL
  Filled 2021-10-09 (×6): qty 1

## 2021-10-09 MED ORDER — AMIODARONE HCL 200 MG PO TABS
200.0000 mg | ORAL_TABLET | Freq: Two times a day (BID) | ORAL | Status: DC
Start: 2021-10-09 — End: 2021-10-15
  Administered 2021-10-09 – 2021-10-15 (×12): 200 mg via ORAL
  Filled 2021-10-09 (×12): qty 1

## 2021-10-09 MED ORDER — HYDRALAZINE HCL 20 MG/ML IJ SOLN: 5.0000 mg | INTRAMUSCULAR | Status: DC | PRN

## 2021-10-09 MED ORDER — PANTOPRAZOLE SODIUM 40 MG PO TBEC
40.0000 mg | DELAYED_RELEASE_TABLET | Freq: Two times a day (BID) | ORAL | Status: DC
  Administered 2021-10-09 – 2021-10-15 (×12): 40 mg via ORAL
  Filled 2021-10-09 (×11): qty 1

## 2021-10-09 MED ORDER — IRBESARTAN 75 MG PO TABS: 37.5000 mg | ORAL_TABLET | Freq: Every day | ORAL | Status: AC

## 2021-10-09 MED ORDER — DOCUSATE SODIUM 100 MG PO CAPS
100.0000 mg | ORAL_CAPSULE | Freq: Two times a day (BID) | ORAL | Status: DC
  Administered 2021-10-09 – 2021-10-15 (×3): 100 mg via ORAL
  Filled 2021-10-09 (×12): qty 1

## 2021-10-09 MED ORDER — ACETAMINOPHEN 325 MG PO TABS: 325.0000 mg | ORAL_TABLET | ORAL | Status: AC | PRN

## 2021-10-09 MED ORDER — IRBESARTAN 75 MG PO TABS
37.5000 mg | ORAL_TABLET | Freq: Every day | ORAL | Status: DC
  Administered 2021-10-10 – 2021-10-15 (×6): 37.5 mg via ORAL
  Filled 2021-10-09 (×6): qty 1

## 2021-10-09 MED ORDER — BISACODYL 5 MG PO TBEC: 5.0000 mg | DELAYED_RELEASE_TABLET | Freq: Every day | ORAL | Status: DC | PRN

## 2021-10-09 MED ORDER — METOPROLOL SUCCINATE ER 50 MG PO TB24
50.0000 mg | ORAL_TABLET | Freq: Every day | ORAL | Status: DC
Start: 2021-10-10 — End: 2021-10-09

## 2021-10-09 MED ORDER — METOPROLOL SUCCINATE ER 50 MG PO TB24
50.0000 mg | ORAL_TABLET | Freq: Every day | ORAL | Status: DC
  Administered 2021-10-10: 50 mg via ORAL
  Filled 2021-10-09: qty 1

## 2021-10-09 MED ORDER — SODIUM CHLORIDE 0.9% FLUSH
3.0000 mL | Freq: Two times a day (BID) | INTRAVENOUS | Status: DC
  Administered 2021-10-09 – 2021-10-14 (×11): 3 mL via INTRAVENOUS

## 2021-10-09 NOTE — Progress Notes (Signed)
Inpatient Rehabilitation Care Coordinator Discharge Note   Patient Details  Name: Javier [name] MRN: [id number] Date of Birth: Feb 29, 1946   Discharge location: Acute  Length of Stay: 8 Days  Discharge activity level: [place]  Home/community participation: spouse  Patient response QI:WLNLGX Literacy - How often do you need to have someone help you when you read instructions, pamphlets, or other written material from your doctor or pharmacy?: Never  Patient response QJ:JHERDE Isolation - How often do you feel lonely or isolated from those around you?: Never  Services provided included: SW, Pharmacy, TR, CM, RN, SLP, OT, PT, RD, MD  Financial Services:  Financial Services Utilized: [place] offered to/list presented to: n/a  Follow-up services arranged:              Patient response to transportation need: Is the patient able to respond to transportation needs?: Yes In the past 12 months, has lack of transportation kept you from medical appointments or from getting medications?: Yes In the past 12 months, has lack of transportation kept you from meetings, work, or from getting things needed for daily living?: Yes    Comments (or additional information):  Patient/Family verbalized understanding of follow-up arrangements:  Yes  Individual responsible for coordination of the follow-up plan: Oleta Mouse [phone]  Confirmed correct DME delivered: [name] 10/09/2021    [name]

## 2021-10-09 NOTE — Discharge Summary (Signed)
Physical Therapy Discharge Summary  Patient Details  Name: Javier [name] MRN: [id number] Date of Birth: Mar 01, 1946  Physical Therapy Discharge Note  This patient was unable to complete the inpatient rehab program due to cardiac/medical change of status; therefore did not meet their long term goals. Pt left the program at a CGA/min assist level using RW for their functional mobility/ transfers. This patient is being discharged from PT services at this time.  Pt's perception of pain in the last five days was unable to answer at this time.   See CareTool for functional status details  If the patient is able to return to inpatient rehabilitation within 3 midnights, this may be considered an interrupted stay and therapy services will resume as ordered. Modification and reinstatement of their goals will be made upon completion of therapy service reevaluations.    Tawana Scale, PT, DPT, NCS, CSRS 10/09/2021, 3:40 PM

## 2021-10-09 NOTE — Plan of Care (Signed)
Change in condition; discharged to acute for tele monitoring and IV amiodarone

## 2021-10-09 NOTE — Progress Notes (Addendum)
PROGRESS NOTE   Subjective/Complaints:   Seen in gym, discussed effects of stroke, with pt, emphasizing longer term recovery and need for ongoing therapy post d/c  ANother episode of SVT this am in PT, SBP in 90s , pt c/o dizziness   ROS: Limited by cognition. Denies fever, pain, SOB, headache, N/V/D.   Objective:   DG Chest 2 View  Result Date: 10/08/2021 CLINICAL DATA:  Cough.  Episode of tachycardia yesterday. EXAM: CHEST - 2 VIEW COMPARISON:  None Available. FINDINGS: The cardiomediastinal silhouette is within normal limits. No airspace consolidation, edema, pleural effusion, or pneumothorax is identified. Multiple old left-sided rib fractures are noted. IMPRESSION: No active cardiopulmonary disease. Electronically Signed   By: Logan Bores M.D.   On: 10/08/2021 15:10   Recent Labs    10/07/21 0622  WBC 6.5  HGB 12.0*  HCT 35.2*  PLT 262    Recent Labs    10/07/21 0622  NA 135  K 3.8  CL 102  CO2 28  GLUCOSE 104*  BUN 14  CREATININE 0.83  CALCIUM 8.8*     Intake/Output Summary (Last 24 hours) at 10/09/2021 6720 Last data filed at 10/09/2021 0745 Gross per 24 hour  Intake 590 ml  Output 700 ml  Net -110 ml         Physical Exam: Vital Signs Blood pressure 135/78, pulse 75, temperature 98.2 F (36.8 C), resp. rate 18, weight 68.9 kg, SpO2 97 %.   General: No acute distress Mood and affect are appropriate Heart: Regular rate and rhythm no rubs murmurs or extra sounds Lungs: Clear to auscultation, breathing unlabored, no rales or wheezes Abdomen: Positive bowel sounds, soft nontender to palpation, nondistended Extremities: No clubbing, cyanosis, or edema Skin: No evidence of breakdown, no evidence of rash Neurologic: Cranial nerves II through XII intact, motor strength is 5/5 in bilateral deltoid, bicep, tricep, grip, hip flexor, knee extensors, ankle dorsiflexor and plantar flexor Sensory exam normal  sensation to light touch and proprioception in bilateral upper and lower extremities Cerebellar exam Ataxia Moderate Left FNF, Left H--> S Musculoskeletal: Full range of motion in all 4 extremities. No joint swelling   Musculoskeletal: FROM all 4 extremities. Absence of joint swelling. Note: PIV removed.   Assessment/Plan: 1. Functional deficits which require 3+ hours per day of interdisciplinary therapy in a comprehensive inpatient rehab setting. Physiatrist is providing close team supervision and 24 hour management of active medical problems listed below. Physiatrist and rehab team continue to assess barriers to discharge/monitor patient progress toward functional and medical goals  Care Tool:  Bathing    Body parts bathed by patient: Right arm, Left arm, Chest, Abdomen, Front perineal area, Buttocks, Right upper leg, Left upper leg, Face         Bathing assist Assist Level: Minimal Assistance - Patient > 75%     Upper Body Dressing/Undressing Upper body dressing   What is the patient wearing?: Pull over shirt    Upper body assist Assist Level: Set up assist    Lower Body Dressing/Undressing Lower body dressing      What is the patient wearing?: Underwear/pull up, Pants     Lower body assist  Assist for lower body dressing: Minimal Assistance - Patient > 75%     Toileting Toileting    Toileting assist Assist for toileting: Minimal Assistance - Patient > 75%     Transfers Chair/bed transfer  Transfers assist     Chair/bed transfer assist level: Contact Guard/Touching assist Chair/bed transfer assistive device: Armrests, Walker   Locomotion Ambulation   Ambulation assist   Ambulation activity did not occur: Safety/medical concerns (Pt did not feel safe attempting gait unless using RW, which he did not use at baseline)  Assist level: Minimal Assistance - Patient > 75% Assistive device: Walker-rolling Max distance: 150ft   Walk 10 feet  activity   Assist  Walk 10 feet activity did not occur: Safety/medical concerns        Walk 50 feet activity   Assist Walk 50 feet with 2 turns activity did not occur: Safety/medical concerns         Walk 150 feet activity   Assist Walk 150 feet activity did not occur: Safety/medical concerns         Walk 10 feet on uneven surface  activity   Assist Walk 10 feet on uneven surfaces activity did not occur: Safety/medical concerns         Wheelchair     Assist Is the patient using a wheelchair?: Yes (for transport to/from gym)      Wheelchair assist level: Dependent - Patient 0%      Wheelchair 50 feet with 2 turns activity    Assist        Assist Level: Dependent - Patient 0%   Wheelchair 150 feet activity     Assist      Assist Level: Dependent - Patient 0%   Blood pressure 135/78, pulse 75, temperature 98.2 F (36.8 C), resp. rate 18, weight 68.9 kg, SpO2 97 %.  Medical Problem List and Plan: 1. 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 Left cerebellar infarct extends into dorsal left midbrain onset 09/25/21.             -patient may  shower             -ELOS/Goals: 12-14 days, supervision goals with PT, OT, SLP             - Continue CIR PT/OT/SLP             -Team conference today please see physician documentation under team conference tab, met with team  to discuss problems,progress, and goals. Formulized individual treatment plan based on medical history, underlying problem and comorbidities.  2.  Antithrombotics: -DVT/anticoagulation:  Pharmaceutical: Lovenox             -antiplatelet therapy: Plavix and aspirin for 3 months then aspirin alone 3. Pain Management: Tylenol as needed 4. Mood: LCSW to evaluate and provide emotional support             -antipsychotic agents: n/a 5. Neuropsych: This patient is capable of making decisions on his own behalf. 6. Skin/Wound Care: Routine skin care  checks 7. Fluids/Electrolytes/Nutrition: Routine Is and Os and follow-up chemistries             -dys 3 diet with nectar; crush meds 8: Dysphagia; continue dysphagia 3 diet with nectar thick liquids             --Continue SLP, currently Dys3 with Nectar thick liquids, he is eating most meals             -Recheck BMP   labs tomorrow, advise fluids intake              -Cr and BUN stable, continue to monitor 6/3 BUN 11, Crt 0.96 6/4 Follow with qMonday labs    Latest Ref Rng & Units 10/07/2021    6:22 AM 10/04/2021    5:47 AM 10/02/2021    5:40 AM  BMP  Glucose 70 - 99 mg/dL 104   103   91    BUN 8 - 23 mg/dL 14   11   11    Creatinine 0.61 - 1.24 mg/dL 0.83   0.96   0.83    Sodium 135 - 145 mmol/L 135   136   137    Potassium 3.5 - 5.1 mmol/L 3.8   4.2   3.8    Chloride 98 - 111 mmol/L 102   100   103    CO2 22 - 32 mmol/L 28   30   26    Calcium 8.9 - 10.3 mg/dL 8.8   8.8   8.7    9: Hypertension: continue metoprolol 50 mg daily             --home Catapres and Benicar held and not restarted    10/09/2021    5:15 AM 10/08/2021    7:47 PM 10/08/2021    4:56 PM  Vitals with BMI  Weight   151 lbs 14 oz  BMI   21.19  Systolic 135 125   Diastolic 78 71   Pulse 75 58   6/7 improved 10: Hyperlipidemia: continue Crestor 20 mg daily             --home Zetia and Repatha held not restarted 11: Prediabetes: random serum glucoses and CBGs within range --follow-up/monitor with PCP -glucose values appear good overall 12: GERD: continue Protonix 40 mg daily 13: Hypokalemia: recheck BMP improved , monitor weekly              -has received supplement             -6/2 K+wnl 6/4 Trend with qMonday labs 14: Anemia: not symptomatic; follow-up CBC Improving     Latest Ref Rng & Units 10/07/2021    6:22 AM 10/02/2021    5:40 AM 10/01/2021    4:37 AM  CBC  WBC 4.0 - 10.5 K/uL 6.5   5.7   5.3    Hemoglobin 13.0 - 17.0 g/dL 12.0   11.5   10.8    Hematocrit 39.0 - 52.0 % 35.2   34.4   32.9    Platelets  150 - 400 K/uL 262   240   220      15. Episodes of SVT--responded to IV metoprolol x 1 dose             -f/u EKG confirmed NSR             -continue metoprolol 50mg daily as bove. Appreciate cardiology consult - will notify cardiology regard episode of SVT this am , recheck EKG   16.  Dry cough , not on ACE-I , no wheezing, no WBC elevation , nl CXR, former smoker  LOS: 8 days A FACE TO FACE EVALUATION WAS PERFORMED   E  10/09/2021, 8:22 AM     

## 2021-10-09 NOTE — Plan of Care (Signed)
  Problem: Education: Goal: Knowledge of General Education information will improve Description: Including pain rating scale, medication(s)/side effects and non-pharmacologic comfort measures Outcome: Progressing   Problem: Health Behavior/Discharge Planning: Goal: Ability to manage health-related needs will improve Outcome: Progressing   Problem: Clinical Measurements: Goal: Cardiovascular complication will be avoided Outcome: Progressing   Problem: Safety: Goal: Ability to remain free from injury will improve Outcome: Progressing   

## 2021-10-09 NOTE — Progress Notes (Signed)
Occupational Therapy Session Note  Patient Details  Name: MURAD STAPLES MRN: [id number] Date of Birth: 07/14/1945  Today's Date: 10/09/2021 OT Individual Time: 3235-5732 OT Individual Time Calculation (min): 25 min    Short Term Goals: Week 1:  OT Short Term Goal 1 (Week 1): Pt will complete stand pivot toilet transfer with supervision OT Short Term Goal 2 (Week 1): Pt will complete donn/doff pants with supervision OT Short Term Goal 3 (Week 1): Pt will bathe LB with CGA OT Short Term Goal 4 (Week 1): Pt will increase shoulder FF/abd to 4-/5 MMT  Skilled Therapeutic Interventions/Progress Updates:  Pt greeted supine in bed having just finished with PT, per PT report pt had episode of tachycardia and low BP. Pts BP from supine 104/87 HR 116.  BP to be reassessed in 10 mins from supine therefore took the opportunity to call wife during session with pt present to discuss the follow questions: - wife unable to attend family ed as she had no one to drive her - wife will assist from bathing from sink level - wife in good health and can provide at least supervision assist.  - wife reports that pt told her that "his leg gave out" clarifed with pt as pt  was referring to an incident that happened in May.   Reassessed BP from supine at end of session as indicated below:  Bp from supine 94/71 (79) hr 141 EKG tech arrived, pt left supine in bed with EKG tech present.    Therapy Documentation Precautions:  Precautions Precautions: Fall, Other (comment) Precaution Comments: HOH, dysphagia diet, L hemibody ataxia Restrictions Weight Bearing Restrictions: No   Pain: No pain reported during session    Therapy/Group: Individual Therapy  Corinne Ports [facility] 10/09/2021, 12:06 PM

## 2021-10-09 NOTE — Progress Notes (Signed)
Occupational Therapy Note  Patient Details  Name: Javier [name] MRN: [id number] Date of Birth: 05/15/1945  Occupational Therapy Discharge Note  This patient was unable to complete the inpatient rehab program due to cardiac/ medical issues; therefore did not meet their long term goals. Pt left the program at a min  assist level for their  functional ADLs. This patient is being discharged from OT services at this time.  BIMS at time of d/c  Pt unable to complete due to medical status  See CareTool for functional status details.  If the patient is able to return to inpatient rehabilitation within 3 midnights, this may be considered an interrupted stay and therapy services will resume as ordered. Modification and reinstatement of their goals will be made upon completion of therapy service reevaluations.     Willeen Cass [facility] 10/09/2021, 10:48 AM

## 2021-10-09 NOTE — Progress Notes (Signed)
Physical Therapy Session Note  Patient Details  Name: Javier [name] MRN: [id number] Date of Birth: 12-Jun-1945  Today's Date: 10/09/2021 PT Individual Time: 0805-0917 PT Individual Time Calculation (min): 72 min   PT Short Term Goals Week 1:  PT Short Term Goal 1 (Week 1): Pt will perform supine<>sit with CGA consistently PT Short Term Goal 1 - Progress (Week 1): Met PT Short Term Goal 2 (Week 1): Pt will perform sit<>stands using LRAD with CGA consistently PT Short Term Goal 2 - Progress (Week 1): Met PT Short Term Goal 3 (Week 1): Pt will perform bed<>chair transfers using LRAD with CGA consistently PT Short Term Goal 3 - Progress (Week 1): Met PT Short Term Goal 4 (Week 1): Pt will ambulate at least 155f using LRAD with CGA PT Short Term Goal 4 - Progress (Week 1): Met PT Short Term Goal 5 (Week 1): Pt will navigate 2 steps per home set-up with min assist PT Short Term Goal 5 - Progress (Week 1): Met Week 2:  PT Short Term Goal 1 (Week 2): = to LTGs based on ELOS  Skilled Therapeutic Interventions/Progress Updates:  Pt received sitting in w/c and agreeable to therapy session. Sit<>stands using RW with CGA for steadying/safety throughout session. Gait training ~1529fto main therapy gym using RW with CGA for steadying - frequent verbal cuing to maintain upright posture and not push down forward through AD - continues to have quick back and forth unsteady movements of AD due to L UE ataxia - cuing for slow, controlled gait speed.   MD in/out for morning assessment.   Gait training ~7025f2 to/from stairs using L HHA and min assist for balance with pt primarily relying heavily on HHA to maintain balance - continues to have truncal and LLE ataxia with increased postural sway and inconsistent L LE placement - pt with very guarded upper body posturing due to increased fear of falling without AD - therapist provided calming and breathing cues to relax and regain balance.  Stair navigation  training ascending/descending 4 (6" height) steps x2 using only the R HR and HHA on opposite side to work towards the fact that pt has no HRs at home on his 2STE - pt prefers and does best with step-to pattern leading with L LE on ascend and R LE on descent - requires heavy min assist for balance with pt continuing to have increased postural sway and a very guarded upper body posturing.   Dynamic gait training of forwards/backwards walking ~67f54f (intermittent rest breaks) with L HHA and min assist for balance - pt continues to rely heavily on L HHA but pt would not feel safe attempting without that support - pushes more strongly into L HHA when stepping backwards with L LE due to L forward trunk lean - continues to have some overshooting when stepping back with L LE but becoming more symmetrical though inconsistently.  Pt's HR spot-checked throughout the above and noted to be around mid50-70bpms.  Gait training 146ft9fng L HHA and min assist for balance - pt continues to push heavily down through L UE support - truncal and LLE ataxia with heavy L foot landing on initial contact and increased postural sway - pt continues to keep upper body held in guarded positioning. HR 100bpm after this but recovers to 70-90bpms in ~1minu51m After prolonged seated rest break, performed dynamic gait and balance task with L UE NMR challenge of grasping horseshoes from a table and walking ~5ft71f  over to basketball goal to place them on the hoop - consistently using L UE support throughout for balance and pt confidence and safety of which pt relies heavily on but pt demos improved ability to walk forward towards goal with decreased ataxia but continues to be ataxia very present when turning or stepping backwards  Seated L UE NMR task of cleaning the horsehose wearing gloves with pt having difficulty manipulating the wipe in his L hand and holding onto it.  Pt reports significant feeling of fatigue.  Noticed HR elevated  to 160bpm therefore assessed BP 99/68 (MAP 76) and SpO2 98% with HR on dynamap of 151bpm  L squat pivot EOM>w/c with min assist for balance. Transported pt back to room. Notified MD and nurse. Squat pivot to EOB with CGA/light min assist. Sit>supine with supervision. Pt left supine in bed in the care of his nurse and nurse tech.    Therapy Documentation Precautions:  Precautions Precautions: Fall, Other (comment) Precaution Comments: HOH, dysphagia diet, L hemibody ataxia Restrictions Weight Bearing Restrictions: No   Pain: Denies pain during session.    Therapy/Group: Individual Therapy  Tawana Scale , PT, DPT, NCS, CSRS 10/09/2021, 7:45 AM

## 2021-10-09 NOTE — Patient Care Conference (Signed)
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 Alysia Penna Social Worker Present: Erlene Quan, BSW Nurse Present: Dorien Chihuahua, RN PT Present: Page Spiro, PT OT Present: Willeen Cass, Ludwig Lean, COTA SLP Present: Charolett Bumpers, SLP PPS Coordinator present : Ileana Ladd, PT     Current Status/Progress Goal Weekly Team Focus  Bowel/Bladder   patient continent to bowel and bladder, some incontinent episodes or urinal spillage  at night  regain continence  toileting at hs   Swallow/Nutrition/ Hydration             ADL's   CGA for stand pivot transfers with Rw, MIN A for toileting tasks, MIN A for bathing from sink level ( has no shower at home, recommended sink baths at home), set- up for UB dressing, MIN A fro LB dressing. continues to be limited by LUE ataxia, poor carryover of compensatory strategies to manage ataxia during ADLs. had the episode of tachycardia now low HR. can wife come in for education?  supervision goals  BADL reeducation, dynamic standing balance, family training   Mobility   supervision supine<>sit, CGA sit<>stand and stand pivot transfers using RW, CGA/min assist gait up to 147f using RW, CGA/min assist 12 stairs using B HRs - pt relies heavily on B UE support to maintain balance due to L hemibody and  truncal ataxia  supervision overall at ambulatory level  pt education, activity tolerance, dynamic standing balance, dynamic gait training, DME training, L hemibody NMR, stair navigation training   Communication             Safety/Cognition/ Behavioral Observations            Pain   denies pain  denies pain      Skin   skin intact  skin to remain intact  assess every shift     Discharge Planning:  Patient plans to discharge back home with spouse able to provide up to MDuncanand able to provide 24/7 supervision   Team Discussion: Patient with ? A-fib, tachycardia. Cardiology consulted; reported plan to discharge to tele unit for monitoring.  Patient on target to meet rehab goals: yes, was making progress towards goals.  *See Care Plan and progress notes for long and short-term goals.   Revisions to Treatment Plan:  N/A   Teaching Needs: Safety, medications, dietary modifications, transfers, toileting, etc.   Current Barriers to Discharge: Decreased caregiver support and Home enviroment access/layout  Possible Resolutions to Barriers: Family education Follow up recommendations pending DME pending     Medical Summary Current Status: bradycardia, dry cough, anemia, hypokalemia  Barriers to Discharge: Medical stability   Possible Resolutions to Barriers/Weekly Focus: cont BP management , monitor anemia, upward trend, monitor e lytes   Continued Need for Acute Rehabilitation Level of Care: The patient requires daily medical management by a physician with  specialized training in physical medicine and rehabilitation for the following reasons: Direction of a multidisciplinary physical rehabilitation program to maximize functional independence : Yes Medical management of patient stability for increased activity during participation in an intensive rehabilitation regime.: Yes Analysis of laboratory values and/or radiology reports with any subsequent need for medication adjustment  and/or medical intervention. : Yes   I attest that I was present, lead the team conference, and concur with the assessment and plan of the team.   Dorien Chihuahua B 10/09/2021, 4:21 PM

## 2021-10-09 NOTE — Progress Notes (Signed)
   10/09/21 1000  Assess: MEWS Score  Temp 97.6 F (36.4 C)  BP 92/61  MAP (mmHg) 70  Pulse Rate (!) 139 (manual)  Resp 18  Level of Consciousness Alert  SpO2 98 %  O2 Device Room Air  Assess: if the MEWS score is Yellow or Red  Were vital signs taken at a resting state? Yes  Focused Assessment No change from prior assessment  Does the patient meet 2 or more of the SIRS criteria? No  MEWS guidelines implemented *See Row Information* Yes  Treat  MEWS Interventions Other (Comment)  Pain Scale 0-10  Pain Score 0  Faces Pain Scale 0  Take Vital Signs  Increase Vital Sign Frequency  Red: Q 1hr X 4 then Q 4hr X 4, if remains red, continue Q 4hrs  Escalate  MEWS: Escalate Red: discuss with charge nurse/RN and provider, consider discussing with RRT  Notify: Charge Nurse/RN  Name of Charge Nurse/RN Notified Santiago Glad RN  Date Charge Nurse/RN Notified 10/09/21  Time Charge Nurse/RN Notified 1008  Notify: Provider  Provider Name/Title dr. Mirian Mo PA  Date Provider Notified 10/09/21  Time Provider Notified 1000  Method of Notification Face-to-face  Provider response See new orders  Date of Provider Response 10/09/21  Time of Provider Response 1000  Document  Patient Outcome Other (Comment) (pt remained on the floor; new orders noted; MD aware)  Progress note created (see row info) Yes  Assess: SIRS CRITERIA  SIRS Temperature  0  SIRS Pulse 1  SIRS Respirations  0  SIRS WBC 0  SIRS Score Sum  1

## 2021-10-09 NOTE — Progress Notes (Signed)
Inpatient Rehabilitation Discharge Medication Review by a Pharmacist  A complete drug regimen review was completed for this patient to identify any potential clinically significant medication issues.  High Risk Drug Classes Is patient taking? Indication by Medication  Antipsychotic No   Anticoagulant Yes Enoxaparin: VTE ppx  Antibiotic No   Opioid No   Antiplatelet Yes Aspirin/clopidogrel: stroke ppx  Hypoglycemics/insulin No   Vasoactive Medication Yes Irbesartan, metoprolol: hypertension  Chemotherapy No   Other Yes Methocarbamol: PRN muscle spasms Pantoprazole: GERD ppx Rosuvastatin: hyperlipidemia/stroke ppx     Type of Medication Issue Identified Description of Issue Recommendation(s)  Drug Interaction(s) (clinically significant)     Duplicate Therapy     Allergy     No Medication Administration End Date     Incorrect Dose     Additional Drug Therapy Needed     Significant med changes from prior encounter (inform family/care partners about these prior to discharge). Discontinued medications: - amlodipine, naproxen Communicate with patient/family medication changes at discharge  Other       Clinically significant medication issues were identified that warrant physician communication and completion of prescribed/recommended actions by midnight of the next day:  Yes and discussed (see below)  Name of provider notified for urgent issues identified: Marlowe Shores, PA-C  Provider Method of Notification: Secure Chat    Pharmacist comments:  Hyperlipidemia: PTA, patient was prescribed Repatha, rosuvastatin, and ezetimibe. Per patient, only taking Repatha PTA LDL elevated above goal of < 70 (current 108) >> recommended to continue rosuvastatin and Repatha for now   Time spent performing this drug regimen review (minutes): 20   Thank you for allowing pharmacy to be a part of this patient's care.  Ardyth Harps, PharmD Clinical Pharmacist

## 2021-10-09 NOTE — Progress Notes (Signed)
Patient transferring to [phone] .Report  given to [facility].

## 2021-10-09 NOTE — Progress Notes (Signed)
Progress Note  Patient Name: Javier [name] Date of Encounter: 10/09/2021  [facility] Cardiologist: Donato Heinz, MD   Subjective   Had another episode of SVT this morning, still appears in SVT. Denies any chest pain, dyspnea, palpitations currently  Inpatient Medications    Scheduled Meds:  aspirin  81 mg Oral Daily   clopidogrel  75 mg Oral Daily   enoxaparin (LOVENOX) injection  40 mg Subcutaneous Daily   irbesartan  37.5 mg Oral Daily   [START ON 10/10/2021] metoprolol succinate  50 mg Oral Daily   pantoprazole  40 mg Oral Daily   rosuvastatin  20 mg Oral Daily   Continuous Infusions:  PRN Meds: acetaminophen, alum & mag hydroxide-simeth, benzonatate, bisacodyl, diphenhydrAMINE, guaiFENesin-dextromethorphan, magnesium hydroxide, methocarbamol, prochlorperazine **OR** prochlorperazine **OR** prochlorperazine, sodium phosphate, traZODone   Vital Signs    Vitals:   10/08/21 1947 10/09/21 0515 10/09/21 1000 10/09/21 1057  BP: 125/71 135/78 92/61 115/71  Pulse: (!) 58 75 (!) 139 (!) 142  Resp: '18 18 18 18  '$ Temp: 97.7 F (36.5 C) 98.2 F (36.8 C) 97.6 F (36.4 C) 97.7 F (36.5 C)  TempSrc:   Oral Oral  SpO2: 100% 97% 98% 97%  Weight:        Intake/Output Summary (Last 24 hours) at 10/09/2021 1111 Last data filed at 10/09/2021 0745 Gross per 24 hour  Intake 354 ml  Output 700 ml  Net -346 ml       10/08/2021    4:56 PM 09/25/2021   10:38 AM 09/25/2021    3:38 AM  Last 3 Weights  Weight (lbs) 151 lb 14.4 oz 154 lb 156 lb 8.4 oz  Weight (kg) 68.9 kg 69.854 kg 71 kg      Telemetry    Not on telemetry- Personally Reviewed  ECG    SVT, rate 143- Personally Reviewed  Physical Exam   GEN: No acute distress.   Neck: No JVD Cardiac: Tachycardic, regular no murmurs, rubs, or gallops.  Respiratory: Clear to auscultation bilaterally. GI: Soft, nontender, non-distended  MS: No edema; No deformity. Neuro:  Nonfocal  Psych: Normal affect    Labs    High Sensitivity Troponin:  No results for input(s): TROPONINIHS in the last 720 hours.   Chemistry Recent Labs  Lab 10/04/21 0547 10/07/21 0622 10/07/21 1631  NA 136 135  --   K 4.2 3.8  --   CL 100 102  --   CO2 30 28  --   GLUCOSE 103* 104*  --   BUN 11 14  --   CREATININE 0.96 0.83  --   CALCIUM 8.8* 8.8*  --   MG  --   --  1.7  GFRNONAA >60 >60  --   ANIONGAP 6 5  --      Lipids No results for input(s): CHOL, TRIG, HDL, LABVLDL, LDLCALC, CHOLHDL in the last 168 hours.  Hematology Recent Labs  Lab 10/07/21 0622  WBC 6.5  RBC 3.79*  HGB 12.0*  HCT 35.2*  MCV 92.9  MCH 31.7  MCHC 34.1  RDW 12.9  PLT 262    Thyroid  Recent Labs  Lab 10/07/21 1631  TSH 1.710     BNPNo results for input(s): BNP, PROBNP in the last 168 hours.  DDimer No results for input(s): DDIMER in the last 168 hours.   Radiology    DG Chest 2 View  Result Date: 10/08/2021 CLINICAL DATA:  Cough.  Episode of tachycardia yesterday. EXAM: CHEST -  2 VIEW COMPARISON:  None Available. FINDINGS: The cardiomediastinal silhouette is within normal limits. No airspace consolidation, edema, pleural effusion, or pneumothorax is identified. Multiple old left-sided rib fractures are noted. IMPRESSION: No active cardiopulmonary disease. Electronically Signed   By: Logan Bores M.D.   On: 10/08/2021 15:10    Cardiac Studies   Echo 09/25/21:  1. Left ventricular ejection fraction, by estimation, is 65 to 70%. The  left ventricle has normal function. The left ventricle has no regional  wall motion abnormalities. There is mild asymmetric left ventricular  hypertrophy of the basal segment. Left  ventricular diastolic parameters were normal.   2. Right ventricular systolic function is normal. The right ventricular  size is normal. There is normal pulmonary artery systolic pressure. The  estimated right ventricular systolic pressure is 57.8 mmHg.   3. Left atrial size was mildly dilated.   4. The  mitral valve is grossly normal. Trivial mitral valve  regurgitation.   5. The aortic valve is tricuspid. Aortic valve regurgitation is not  visualized. Aortic valve mean gradient measures 2.0 mmHg.   6. Aortic dilatation noted. There is mild dilatation of the aortic root,  measuring 40 mm.   7. The inferior vena cava is normal in size with greater than 50%  respiratory variability, suggesting right atrial pressure of 3 mmHg.   Patient Profile     76 y.o. male with a hx of HTN, HLD, GERD, ETOH use disorder admitted 09/25/21 with dizziness and poor balance dx with CVA who is being seen 10/07/2021 for the evaluation of tachycardia  Assessment & Plan    SVT: has had multiple episodes since admission.  Rates to 150s, self terminating.  TSH normal.  Echo shows normal biventricular function, no significant valvular disease. -Continue toprol XL 50 mg, no room to increase given low resting HR.   -Maintain K greater than 4, mag greater than 2 -Had another episode of SVT this morning.  Rates in 140s on EKG.  Discussed with primary team and recommend transferring to telemetry bed.  Suspect will likely need to start amiodarone to maintain sinus rhythm so he is able to complete his therapy.  Once on telemetry, if remains in SVT would start IV amiodarone.  If has converted to sinus, can start p.o. amiodarone 200 mg twice daily  CVA  on ASA and plavix, rosuvastatin.   -Plan 30-day monitor on discharge  Hypertension: On irbesartan and metoprolol   For questions or updates, please contact [place] Please consult [email or web address] for contact info under        Signed, Donato Heinz, MD  10/09/2021, 11:11 AM

## 2021-10-09 NOTE — Progress Notes (Signed)
   Pt now on 6E and in [place].  I placed order to notify cardiology for any further SVT  Cecilie Kicks, [place]  [phone] or after 5pm and on weekends call [phone] 10/09/2021.

## 2021-10-09 NOTE — Progress Notes (Signed)
Orders to transfer patient to acute waiting for room. Wife notified of transfer. Continued to monitor

## 2021-10-09 NOTE — H&P (Signed)
History and Physical    Patient: Javier [name] QBH:[id number] DOB: May 10, 1945 DOA: 10/09/2021 DOS: the patient was seen and examined on 10/09/2021 PCP: Pcp, No  DOA: 10/01/2021 DOS: the patient was seen and examined on 10/09/2021 NOK:  Wife, Brenson Hartman, [phone]    HPI: Javier [name] is a 76 y.o. male with past medical history of HTN and HLD who was admitted from 5/24-30 with an acute cerebellar CVA.  During the hospitalization, he had an episode of SVT that resolved with IV metoprolol.  He was discharged to [facility] and cardiology has been consulting there.  He was fixated on his IV and asked repeatedly about what will happen to it.  He did not seem to understand the concept of transferring back to the acute hospital.  He denies racing heart, CP, SOB, or really any other symptoms.     Referring physician: Kirsteins/Love - CIR Reason for transfer back to acute care: In and out of SVT.  HR 160 during therapy this AM, had not yet gotten metoprolol.  Given metoprolol with HR 140s, SBP 93.  He is symptomatic, unsure how often it is happening.  Cardiology has been consulting.  They are concerned that he needs to be on telemetry - maybe resume rehab after.      Review of Systems: unable to review all systems due to the inability of the patient to answer questions.  ROS is not presumed to be accurate.  Past Medical History:  Diagnosis Date   Cataract    HLD (hyperlipidemia)    Hypertension    Past Surgical History:  Procedure Laterality Date   COLONOSCOPY  11/19/2009   RMR: 1. Multiple small rectal polyps status post snare polypectomy. 2. Multiple left colon polyp status post snare removal . Poor prep compromised exam.    COLONOSCOPY N/A 12/13/2014   Procedure: COLONOSCOPY;  Surgeon: Daneil Dolin, MD;  Location: [facility];  Service: Endoscopy;  Laterality: N/A;  945   COLONOSCOPY N/A 07/18/2020   Procedure: COLONOSCOPY;  Surgeon: Daneil Dolin, MD;  Location: [facility];  Service: Endoscopy;  Laterality: N/A;  am   FLEXIBLE SIGMOIDOSCOPY N/A 04/25/2020   Procedure: FLEXIBLE SIGMOIDOSCOPY;  Surgeon: Daneil Dolin, MD; incomplete colonoscopy with flex sig only due to poor prep.   HERNIA REPAIR     POLYPECTOMY  07/18/2020   Procedure: POLYPECTOMY;  Surgeon: Daneil Dolin, MD;  Location: [facility];  Service: Endoscopy;;   Social History:  reports that he has quit smoking. He has never used smokeless tobacco. He reports current alcohol use. He reports that he does not use drugs.  No Known Allergies  Family History  Problem Relation Age of Onset   Colon cancer Neg Hx    Stroke Mother    Heart attack Father     Prior to Admission medications   Medication Sig Start Date End Date Taking? Authorizing Provider  aspirin EC 81 MG tablet Take 1 tablet (81 mg total) by mouth daily. Swallow whole. 09/26/21 12/20/22  Barton Dubois, MD  benzonatate (TESSALON) 100 MG capsule Take 1 capsule (100 mg total) by mouth every 8 (eight) hours as needed for cough. 09/26/21   Barton Dubois, MD  cloNIDine (CATAPRES) 0.1 MG tablet Take by mouth daily.  06/24/12   [provider]  clopidogrel (PLAVIX) 75 MG tablet Take 1 tablet (75 mg total) by mouth daily. 09/26/21 12/25/21  Barton Dubois, MD  ezetimibe (ZETIA) 10 MG tablet Take by mouth  daily.     [provider]  metoprolol succinate (TOPROL-XL) 50 MG 24 hr tablet Take 50 mg by mouth daily. Take with or immediately following a meal.     [provider]  olmesartan (BENICAR) 20 MG tablet Take 20 mg by mouth daily.     [provider]  pantoprazole (PROTONIX) 40 MG tablet Take 1 tablet (40 mg total) by mouth 2 (two) times daily. 09/26/21   Barton Dubois, MD  Poydras 420 MG/3.5ML SOCT SMARTSIG:420 Milligram(s) SUB-Q Once a Month 11/09/19   [provider]  rosuvastatin (CRESTOR) 20 MG tablet Take 20 mg by mouth daily.     [provider]    Physical  Exam: Vitals:   10/08/21 1656 10/08/21 1947 10/09/21 0515 10/09/21 1000  BP:  125/71 135/78 92/61  Pulse:  (!) 58 75 (!) 139  Resp:  '18 18 18  '$ Temp:  97.7 F (36.5 C) 98.2 F (36.8 C) 97.6 F (36.4 C)  TempSrc:    Oral  SpO2:  100% 97% 98%  Weight: 68.9 kg      General:  Appears calm and comfortable and is in NAD, confused Eyes:  EOMI, normal lids, iris ENT:  grossly normal hearing, lips & tongue, mmm Neck:  no LAD, masses or thyromegaly Cardiovascular:  RRR, no m/r/g. No LE edema.  Respiratory:   CTA bilaterally with no wheezes/rales/rhonchi.  Normal respiratory effort. Abdomen:  soft, NT, ND Skin:  no rash or induration seen on limited exam Musculoskeletal:  grossly normal tone BUE/BLE, good ROM, no bony abnormality Psychiatric:  confused mood and affect, speech fluent and appropriate but confused and mildly perseverative Neurologic:  CN 2-12 grossly intact, moves all extremities in coordinated fashion   Radiological Exams on Admission: Independently reviewed - see discussion in A/P where applicable  DG Chest 2 View  Result Date: 10/08/2021 CLINICAL DATA:  Cough.  Episode of tachycardia yesterday. EXAM: CHEST - 2 VIEW COMPARISON:  None Available. FINDINGS: The cardiomediastinal silhouette is within normal limits. No airspace consolidation, edema, pleural effusion, or pneumothorax is identified. Multiple old left-sided rib fractures are noted. IMPRESSION: No active cardiopulmonary disease. Electronically Signed   By: Logan Bores M.D.   On: 10/08/2021 15:10    EKG: Independently reviewed.  SVT with rate 143; no evidence of acute ischemia   Labs on Admission: I have personally reviewed the available labs and imaging studies at the time of the admission.  Pertinent labs:    Unremarkable BMP on 6/5 Unremarkable CBC on 6/5 Normal TSH on 6/5  Assessment and Plan:   SVT -Patient is having periodic bouts of SVT -He was started on leta blockers but is having persistent  symptoms and so is unable to participate in therapy -Will transfer back to acute hospital for ongoing care -Cardiology is consulting and has recommended starting Amiodarone and monitoring on telemetry  CVA -Noted to have generalized weakness and impaired coordination -he appeared to be mildly confused and perseverative at the time of my evaluation today; will place delirium precautions -Needs dysphagia 3 diet with nectar thick liquids -Will order PT/OT/ST evaluation/treatment until he is able to return to rehab -Continue ASA, Plavix  HTN -Continue Avapro, metoprolol  HLD -Continue Crestor  Advance Care Planning:   Code Status: Full  Consults: Cardiology; PT/OT/ST; TOC team; nutrition  DVT Prophylaxis: Lovenox  Family Communication: None present; I attempted to call his wife at the time of transfer and was unable to reach her  Severity of  Illness: The appropriate patient status for this patient is INPATIENT. Inpatient status is judged to be reasonable and necessary in order to provide the required intensity of service to ensure the patient's safety. The patient's presenting symptoms, physical exam findings, and initial radiographic and laboratory data in the context of their chronic comorbidities is felt to place them at high risk for further clinical deterioration. Furthermore, it is not anticipated that the patient will be medically stable for discharge from the hospital within 2 midnights of admission.   * I certify that at the point of admission it is my clinical judgment that the patient will require inpatient hospital care spanning beyond 2 midnights from the point of admission due to high intensity of service, high risk for further deterioration and high frequency of surveillance required.*    Author: Karmen Bongo, MD 10/09/2021 10:44 AM  For on call review [email or web address].

## 2021-10-09 NOTE — Discharge Summary (Signed)
Physician Discharge Summary  Patient ID: Javier [name] MRN: [id number] DOB/AGE: 05/07/1945 76 y.o.  Admit date: 10/01/2021 Discharge date: 10/09/2021  Discharge Diagnoses:  Principal Problem:   Cerebellar stroke [facility]) Active Problems:   SVT (supraventricular tachycardia) (HCC) Hypertension Hyperlipidemia Dysphagia Prediabetes GERD Anemia  Discharged Condition: Guarded  Significant Diagnostic Studies: DG Chest 2 View  Result Date: 10/08/2021 CLINICAL DATA:  Cough.  Episode of tachycardia yesterday. EXAM: CHEST - 2 VIEW COMPARISON:  None Available. FINDINGS: The cardiomediastinal silhouette is within normal limits. No airspace consolidation, edema, pleural effusion, or pneumothorax is identified. Multiple old left-sided rib fractures are noted. IMPRESSION: No active cardiopulmonary disease. Electronically Signed   By: Logan Bores M.D.   On: 10/08/2021 15:10   CT Head Wo Contrast  Result Date: 09/25/2021 CLINICAL DATA:  Dizziness, persistent/recurrent with cardiac or vascular cause suspected. EXAM: CT HEAD WITHOUT CONTRAST TECHNIQUE: Contiguous axial images were obtained from the base of the skull through the vertex without intravenous contrast. RADIATION DOSE REDUCTION: This exam was performed according to the departmental dose-optimization program which includes automated exposure control, adjustment of the mA and/or kV according to patient size and/or use of iterative reconstruction technique. COMPARISON:  None Available. FINDINGS: Brain: No evidence of acute infarction, hemorrhage, hydrocephalus, extra-axial collection or mass lesion/mass effect. Well-defined/chronic appearing infarcts in the left more than right cerebellum and left occipital cortex. Vascular: No hyperdense vessel or unexpected calcification. Skull: Normal. Negative for fracture or focal lesion. Sinuses/Orbits: No acute finding. IMPRESSION: 1. No acute finding. 2. Chronic infarcts in the left more than right cerebellum  and left occipital cortex. Electronically Signed   By: Jorje Guild M.D.   On: 09/25/2021 04:34   MR ANGIO HEAD WO CONTRAST  Result Date: 09/25/2021 CLINICAL DATA:  Dizziness, persistent/recurrent, cardiac or vascular cause suspected EXAM: MRI HEAD WITHOUT CONTRAST MRA HEAD WITHOUT CONTRAST TECHNIQUE: Multiplanar, multi-echo pulse sequences of the brain and surrounding structures were acquired without intravenous contrast. Angiographic images of the Circle of Willis were acquired using MRA technique without intravenous contrast. COMPARISON:  CT from earlier same day FINDINGS: MRI HEAD Patient could not tolerate all sequences. Brain: There is mildly reduced diffusion in the superior left cerebellum involving the hemisphere and vermis. There is extension into the dorsal left midbrain with involvement of the superior cerebellar peduncle. Additional small area in the inferior left cerebellum. Two small foci of reduced diffusion in the right cerebellum. No evidence of hemorrhage. Additional chronic bilateral cerebellar and left occipital infarcts. Patchy foci of T2 hyperintensity in the supratentorial white matter are nonspecific but probably reflect chronic microvascular ischemic changes. Prominence of the ventricles and sulci reflects parenchymal volume loss. There is no intracranial mass or mass effect. No extra-axial collection. No hydrocephalus. Vascular: Major vessel flow voids at the skull base are preserved. Skull and upper cervical spine: Normal marrow signal is preserved. Sinuses/Orbits: Mild patchy mucosal thickening. Bilateral lens replacements. Other: Sella is unremarkable.  Mastoid air cells are clear. MRA HEAD Motion artifact is present. Intracranial internal carotid arteries are patent with atherosclerotic irregularity. Middle and anterior cerebral arteries are patent. Intracranial vertebral arteries are patent with atherosclerotic irregularity. Patent PICA origins, more irregular on the left.  Likely patent AICA origins. Right SCA origin is patent. The left SCA origin is not identified. Bilateral posterior communicating arteries are present. Posterior cerebral arteries are patent. Bilateral posterior communicating arteries are present. IMPRESSION: Acute infarcts involving the left cerebellum extending into the dorsal left midbrain. Two small foci of acute infarction in  the right cerebellum. Patent vertebrobasilar arteries. Left SCA may be occluded. 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 microvascular ischemic changes. Electronically Signed   By: Macy Mis M.D.   On: 09/25/2021 08:03   MR BRAIN WO CONTRAST  Result Date: 09/25/2021 CLINICAL DATA:  Dizziness, persistent/recurrent, cardiac or vascular cause suspected EXAM: MRI HEAD WITHOUT CONTRAST MRA HEAD WITHOUT CONTRAST TECHNIQUE: Multiplanar, multi-echo pulse sequences of the brain and surrounding structures were acquired without intravenous contrast. Angiographic images of the Circle of Willis were acquired using MRA technique without intravenous contrast. COMPARISON:  CT from earlier same day FINDINGS: MRI HEAD Patient could not tolerate all sequences. Brain: There is mildly reduced diffusion in the superior left cerebellum involving the hemisphere and vermis. There is extension into the dorsal left midbrain with involvement of the superior cerebellar peduncle. Additional small area in the inferior left cerebellum. Two small foci of reduced diffusion in the right cerebellum. No evidence of hemorrhage. Additional chronic bilateral cerebellar and left occipital infarcts. Patchy foci of T2 hyperintensity in the supratentorial white matter are nonspecific but probably reflect chronic microvascular ischemic changes. Prominence of the ventricles and sulci reflects parenchymal volume loss. There is no intracranial mass or mass effect. No extra-axial collection. No hydrocephalus. Vascular: Major vessel flow voids at the  skull base are preserved. Skull and upper cervical spine: Normal marrow signal is preserved. Sinuses/Orbits: Mild patchy mucosal thickening. Bilateral lens replacements. Other: Sella is unremarkable.  Mastoid air cells are clear. MRA HEAD Motion artifact is present. Intracranial internal carotid arteries are patent with atherosclerotic irregularity. Middle and anterior cerebral arteries are patent. Intracranial vertebral arteries are patent with atherosclerotic irregularity. Patent PICA origins, more irregular on the left. Likely patent AICA origins. Right SCA origin is patent. The left SCA origin is not identified. Bilateral posterior communicating arteries are present. Posterior cerebral arteries are patent. Bilateral posterior communicating arteries are present. IMPRESSION: Acute infarcts involving the left cerebellum extending into the dorsal left midbrain. Two small foci of acute infarction in the right cerebellum. Patent vertebrobasilar arteries. Left SCA may be occluded. 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 microvascular ischemic changes. Electronically Signed   By: Macy Mis M.D.   On: 09/25/2021 08:03   US Carotid Bilateral  Result Date: 09/26/2021 CLINICAL DATA:  Stroke. History of hypertension and hyperlipidemia. Former smoker. EXAM: BILATERAL CAROTID DUPLEX ULTRASOUND TECHNIQUE: Pearline Cables scale imaging, color Doppler and duplex ultrasound were performed of bilateral carotid and vertebral arteries in the neck. COMPARISON:  Brain MRI-09/25/2021 FINDINGS: Criteria: Quantification of carotid stenosis is based on velocity parameters that correlate the residual internal carotid diameter with NASCET-based stenosis levels, using the diameter of the distal internal carotid lumen as the denominator for stenosis measurement. The following velocity measurements were obtained: RIGHT ICA: 69/19 cm/sec CCA: 263/7 cm/sec SYSTOLIC ICA/CCA RATIO:  0.6 ECA: 67 cm/sec LEFT ICA: 95/24 cm/sec  CCA: 85/8 cm/sec SYSTOLIC ICA/CCA RATIO:  1.5 ECA: 64 cm/sec RIGHT CAROTID ARTERY: There is a minimal amount of eccentric echogenic plaque scattered throughout the right common carotid artery. There is a minimal amount of eccentric echogenic plaque within the right carotid bulb, extending to involve the origin and proximal aspects of the right internal carotid artery (image 24), not resulting in elevated peak systolic velocities within the interrogated course of the right internal carotid artery to suggest a hemodynamically significant stenosis. RIGHT VERTEBRAL ARTERY:  Not visualized LEFT CAROTID ARTERY: There is a minimal amount of atherosclerotic plaque scattered throughout the left common carotid  artery. There is a minimal amount of eccentric echogenic plaque involving the origin and proximal aspects of the left internal carotid artery (image 58), not resulting in elevated peak systolic velocities within the interrogated course of the left internal carotid artery to suggest a hemodynamically significant stenosis. LEFT VERTEBRAL ARTERY:  Antegrade Flow IMPRESSION: 1. Minimal amount of bilateral atherosclerotic plaque, right subjectively greater than left, not resulting in a hemodynamically significant stenosis within either internal carotid artery. 2. Nonvisualization of the right vertebral artery, an age-indeterminate finding in the absence of prior examinations. 3. Patent left vertebral artery. Electronically Signed   By: Sandi Mariscal M.D.   On: 09/26/2021 08:55   DG Swallowing Func-Speech Pathology  Result Date: 09/27/2021 Table formatting from the original result was not included. Objective Swallowing Evaluation: Type of Study: MBS-Modified Barium Swallow Study  Patient Details Name: Javier [name] MRN: [id number] Date of Birth: 10/01/1945 Today's Date: 09/27/2021 Time: SLP Start Time (ACUTE ONLY): 1100 -SLP Stop Time (ACUTE ONLY): 1117 SLP Time Calculation (min) (ACUTE ONLY): 17 min Past Medical History:  Past Medical History: Diagnosis Date  Cataract   HLD (hyperlipidemia)   Hypertension  Past Surgical History: Past Surgical History: Procedure Laterality Date  COLONOSCOPY  11/19/2009  RMR: 1. Multiple small rectal polyps status post snare polypectomy. 2. Multiple left colon polyp status post snare removal . Poor prep compromised exam.   COLONOSCOPY N/A 12/13/2014  Procedure: COLONOSCOPY;  Surgeon: Daneil Dolin, MD;  [facility];  Service: Endoscopy;  Laterality: N/A;  945  COLONOSCOPY N/A 07/18/2020  Procedure: COLONOSCOPY;  Surgeon: Daneil Dolin, MD;  Location: [facility];  Service: Endoscopy;  Laterality: N/A;  am  FLEXIBLE SIGMOIDOSCOPY N/A 04/25/2020  Procedure: FLEXIBLE SIGMOIDOSCOPY;  Surgeon: Daneil Dolin, MD; incomplete colonoscopy with flex sig only due to poor prep.  HERNIA REPAIR    POLYPECTOMY  07/18/2020  Procedure: POLYPECTOMY;  Surgeon: Daneil Dolin, MD;  Location: [facility];  Service: Endoscopy;; HPI: This is a 76 year old male who presents by EMS with concerns for dizziness.  Patient states that he woke up this morning and felt weak and dizzy.  He has been nauseated.  He denies room spinning dizziness.  Patient reports that he drinks daily.  When asked, she drank last night he cannot quantify.  Patient states that every time he gets up "I just have to fall back down."  EMS noted that he was able to ambulate to the stretcher.  He denies any abdominal pain but has had 1 episode of nonbilious, nonbloody emesis.  No recent illnesses or fevers. 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 Two small foci of acute infarction in the  right cerebellum. Patent vertebrobasilar arteries. 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 BSE completed on 09/26/21 initiating Dysphagia 3/thin w/ precautions; concerns for aspiration.  Subjective: "I"ve been coughing"  Recommendations for follow up therapy are one component of a multi-disciplinary discharge  planning process, led by the attending physician.  Recommendations may be updated based on patient status, additional functional criteria and insurance authorization. Assessment / Plan / Recommendation   09/27/2021  11:00 AM Clinical Impressions Clinical Impression Pt presents with mild oropharyngeal dysphagia characterized by impaired mastication (likely d/t edentulous state, pt does not have dentures present for study), delay in the initiaton of the swallow to valleculae and one incidence to the pyriform sinuses (with thin via tsp).  Decreased laryngeal elevation noted during study which primarily impacted  thin via larger volumes (successive swallows) via cup d/t decreased airway closure and trace aspiration which was cleared via throat clear or cough with cueing from SLP.  Smaller volumes of thin liquids did not elicit this response.  Good oral/pharyngeal clearance noted throughout study and no evidence of aspiration observed with any other consistency.  Pt with impulsive tendency, so larger volumes overall with liquids/foods place him at a higher aspiration risk  paired with impulsivity.  Recommend continuing Dysphagia 3/thin via small SUPERVISED amounts.  FULL supervision required during meal intake with precautions in place at all times. SLP Visit Diagnosis Dysphagia, oropharyngeal phase (R13.12) Impact on safety and function Mild aspiration risk;Moderate aspiration risk     09/27/2021  11:00 AM Treatment Recommendations Treatment Recommendations Therapy as outlined in treatment plan below     09/27/2021  11:00 AM Prognosis Prognosis for Safe Diet Advancement Good   09/27/2021  11:00 AM Diet Recommendations SLP Diet Recommendations Dysphagia 3 (Mech soft) solids;Thin liquid Liquid Administration via Cup;No straw Medication Administration Whole meds with puree Compensations Slow rate;Small sips/bites;Minimize environmental distractions;Clear throat intermittently;Multiple dry swallows after each bite/sip Postural  Changes Seated upright at 90 degrees     09/27/2021  11:00 AM Other Recommendations Oral Care Recommendations Oral care BID;Staff/trained caregiver to provide oral care Follow Up Recommendations Follow physician's recommendations for discharge plan and follow up therapies Assistance recommended at discharge Frequent or constant Supervision/Assistance Functional Status Assessment Patient has had a recent decline in their functional status and demonstrates the ability to make significant improvements in function in a reasonable and predictable amount of time.   09/27/2021  11:00 AM Frequency and Duration  Speech Therapy Frequency (ACUTE ONLY) min 2x/week Treatment Duration 1 week     09/27/2021  11:00 AM Oral Phase Oral Phase [facility]    09/27/2021  11:00 AM Pharyngeal Phase Pharyngeal Phase Impaired Pharyngeal- Nectar Teaspoon NT Pharyngeal Material does not enter airway Pharyngeal- Nectar Cup Delayed swallow initiation-vallecula Pharyngeal Material does not enter airway Pharyngeal- Nectar Straw NT Pharyngeal- Thin Teaspoon Delayed swallow initiation-pyriform sinuses;Reduced laryngeal elevation Pharyngeal Material does not enter airway Pharyngeal- Thin Cup Reduced laryngeal elevation;Reduced airway/laryngeal closure;Delayed swallow initiation-vallecula Pharyngeal Material enters airway, remains ABOVE vocal cords then ejected out Pharyngeal- Thin Straw NT Pharyngeal- Puree Delayed swallow initiation-vallecula Pharyngeal Material does not enter airway Pharyngeal- Mechanical Soft Delayed swallow initiation-vallecula Pharyngeal Material does not enter airway Pharyngeal- Multi-consistency NT Pharyngeal- Pill Delayed swallow initiation-vallecula Pharyngeal Material does not enter airway    09/27/2021  11:00 AM Cervical Esophageal Phase  Cervical Esophageal Phase [facility] Elvina Sidle, M.S., CCC-SLP 09/27/2021, 12:12 PM                     ECHOCARDIOGRAM COMPLETE  Result Date: 09/25/2021    ECHOCARDIOGRAM REPORT   Patient Name:   Javier [name] Date of Exam: 09/25/2021 Medical Rec #:  [id number]           Height:       71.0 in Accession #:    [id number]          Weight:       154.0 lb Date of Birth:  1945-09-04            BSA:          1.887 m Patient Age:    30 years            BP:           152/76 mmHg Patient Gender: M  HR:           63 bpm. Exam Location:  [facility] Procedure: 2D Echo, Cardiac Doppler and Color Doppler Indications:   Stroke  History:       Patient has no prior history of Echocardiogram examinations.                Stroke.  Sonographer:   Wenda Low Referring      [place] Phys:  Sonographer Comments: Image acquisition challenging due to respiratory motion. IMPRESSIONS  1. Left ventricular ejection fraction, by estimation, is 65 to 70%. The left ventricle has normal function. The left ventricle has no regional wall motion abnormalities. There is mild asymmetric left ventricular hypertrophy of the basal segment. Left ventricular diastolic parameters were normal.  2. Right ventricular systolic function is normal. The right ventricular size is normal. There is normal pulmonary artery systolic pressure. The estimated right ventricular systolic pressure is 78.9 mmHg.  3. Left atrial size was mildly dilated.  4. The mitral valve is grossly normal. Trivial mitral valve regurgitation.  5. The aortic valve is tricuspid. Aortic valve regurgitation is not visualized. Aortic valve mean gradient measures 2.0 mmHg.  6. Aortic dilatation noted. There is mild dilatation of the aortic root, measuring 40 mm.  7. The inferior vena cava is normal in size with greater than 50% respiratory variability, suggesting right atrial pressure of 3 mmHg. Comparison(s): No prior Echocardiogram. FINDINGS  Left Ventricle: Left ventricular ejection fraction, by estimation, is 65 to 70%. The left ventricle has normal function. The left ventricle has no regional wall motion abnormalities. The left ventricular internal cavity size  was normal in size. There is  mild asymmetric left ventricular hypertrophy of the basal segment. Left ventricular diastolic parameters were normal. Right Ventricle: The right ventricular size is normal. No increase in right ventricular wall thickness. Right ventricular systolic function is normal. There is normal pulmonary artery systolic pressure. The tricuspid regurgitant velocity is 2.35 m/s, and  with an assumed right atrial pressure of 3 mmHg, the estimated right ventricular systolic pressure is 38.1 mmHg. Left Atrium: Left atrial size was mildly dilated. Right Atrium: Right atrial size was normal in size. Pericardium: There is no evidence of pericardial effusion. Mitral Valve: The mitral valve is grossly normal. Trivial mitral valve regurgitation. MV peak gradient, 3.2 mmHg. The mean mitral valve gradient is 1.0 mmHg. Tricuspid Valve: The tricuspid valve is grossly normal. Tricuspid valve regurgitation is mild. Aortic Valve: The aortic valve is tricuspid. There is mild aortic valve annular calcification. Aortic valve regurgitation is not visualized. Aortic valve mean gradient measures 2.0 mmHg. Aortic valve peak gradient measures 3.9 mmHg. Aortic valve area, by  VTI measures 4.51 cm. Pulmonic Valve: The pulmonic valve was grossly normal. Pulmonic valve regurgitation is trivial. Aorta: Aortic dilatation noted. There is mild dilatation of the aortic root, measuring 40 mm. Venous: The inferior vena cava is normal in size with greater than 50% respiratory variability, suggesting right atrial pressure of 3 mmHg. IAS/Shunts: No atrial level shunt detected by color flow Doppler.  LEFT VENTRICLE PLAX 2D LVIDd:         4.30 cm      Diastology LVIDs:         2.10 cm      LV e' medial:    9.14 cm/s LV PW:         1.10 cm      LV E/e' medial:  8.2 LV IVS:  1.20 cm      LV e' lateral:   9.36 cm/s LVOT diam:     2.50 cm      LV E/e' lateral: 8.0 LV SV:         112 LV SV Index:   60 LVOT Area:     4.91 cm  LV Volumes  (MOD) LV vol d, MOD A2C: 101.0 ml LV vol d, MOD A4C: 82.8 ml LV vol s, MOD A2C: 29.7 ml LV vol s, MOD A4C: 27.7 ml LV SV MOD A2C:     71.3 ml LV SV MOD A4C:     82.8 ml LV SV MOD BP:      63.0 ml RIGHT VENTRICLE RV Basal diam:  3.75 cm RV Mid diam:    3.10 cm RV S prime:     10.30 cm/s TAPSE (M-mode): 2.8 cm LEFT ATRIUM             Index        RIGHT ATRIUM           Index LA diam:        3.60 cm 1.91 cm/m   RA Area:     19.00 cm LA Vol (A2C):   77.6 ml 41.13 ml/m  RA Volume:   57.20 ml  30.31 ml/m LA Vol (A4C):   65.0 ml 34.45 ml/m LA Biplane Vol: 72.7 ml 38.53 ml/m  AORTIC VALVE                    PULMONIC VALVE AV Area (Vmax):    4.33 cm     PV Vmax:       0.66 m/s AV Area (Vmean):   4.50 cm     PV Peak grad:  1.8 mmHg AV Area (VTI):     4.51 cm AV Vmax:           99.20 cm/s AV Vmean:          65.400 cm/s AV VTI:            0.249 m AV Peak Grad:      3.9 mmHg AV Mean Grad:      2.0 mmHg LVOT Vmax:         87.50 cm/s LVOT Vmean:        60.000 cm/s LVOT VTI:          0.229 m LVOT/AV VTI ratio: 0.92  AORTA Ao Root diam: 4.00 cm Ao Asc diam:  3.60 cm MITRAL VALVE               TRICUSPID VALVE MV Area (PHT): 3.50 cm    TR Peak grad:   22.1 mmHg MV Area VTI:   3.29 cm    TR Vmax:        235.00 cm/s MV Peak grad:  3.2 mmHg MV Mean grad:  1.0 mmHg    SHUNTS MV Vmax:       0.89 m/s    Systemic VTI:  0.23 m MV Vmean:      41.8 cm/s   Systemic Diam: 2.50 cm MV Decel Time: 217 msec MV E velocity: 75.00 cm/s MV A velocity: 66.80 cm/s MV E/A ratio:  1.12 Rozann Lesches MD Electronically signed by Rozann Lesches MD Signature Date/Time: 09/25/2021/2:30:39 PM    Final     Labs:  Basic Metabolic Panel: Recent Labs  Lab 10/04/21 0547 10/07/21 0622 10/07/21 1631  NA 136 135  --   K 4.2 3.8  --  CL 100 102  --   CO2 30 28  --   GLUCOSE 103* 104*  --   BUN 11 14  --   CREATININE 0.96 0.83  --   CALCIUM 8.8* 8.8*  --   MG  --   --  1.7    CBC:    Latest Ref Rng & Units 10/07/2021    6:22 AM 10/02/2021     5:40 AM 10/01/2021    4:37 AM  CBC  WBC 4.0 - 10.5 K/uL 6.5   5.7   5.3    Hemoglobin 13.0 - 17.0 g/dL 12.0   11.5   10.8    Hematocrit 39.0 - 52.0 % 35.2   34.4   32.9    Platelets 150 - 400 K/uL 262   240   220       CBG: No results for input(s): GLUCAP in the last 168 hours.  Family history.  Mother with CVA father with myocardial infarction.  Denies any colon cancer esophageal cancer or rectal cancer  Brief HPI:   Javier [name] is a 76 y.o. right-handed male with history of hypertension, hyperlipidemia.  Presented emergency department complaining of acute onset of imbalance while trying to stand on the morning of 09/25/2021.  There was associated dizziness generalized weakness and nausea.  EMS was called the patient was able to ambulate to the stretcher.  There was report of recent alcohol intake.  BAC level negative.  UDS negative.  CT of head negative for acute hemorrhage.  Physical exam significant for left upper and lower extremity ataxia.  MRI positive for acute infarct involving the left cerebellum extending into the dorsal and left midbrain 2 small foci of acute infarct in the right cerebellum.  Neurology consulted.  No tPA was given.  Etiology likely atheroembolic versus cardioembolic.  TEE done 09/21/2021.  Aspirin 81 mg daily and Plavix 75 mg daily added for 90 days followed by aspirin alone.  Recommendations of 30-day cardiac event monitor.  Therapy evaluations completed due to patient's decreased functional mobility was admitted for comprehensive rehab program.   Hospital Course: Javier [name] was admitted to rehab 10/01/2021 for inpatient therapies to consist of PT, ST and OT at least three hours five days a week. Past admission physiatrist, therapy team and rehab RN have worked together to provide customized collaborative inpatient rehab. At admission, patient required pertaining to patient's cerebellar infarction remained stable aspirin and Plavix therapy x3 months  then aspirin alone.  Lovenox for DVT prophylaxis.  Tolerating a mechanical soft nectar thick liquid diet.  Monitoring of hydration.  Blood pressure remains somewhat soft noted episode of SVT currently on Avapro as well as Toprol-XL 50 mg daily.  He did respond to IV metoprolol x1 dose.  Noted episodes as BP in the 90s chief complaint of dizziness with cardiology services consulted EKG completed reviewed per cardiology services follow-up for SVT wide-complex due to these findings patient was discharged to acute care services for ongoing monitoring.   Blood pressures were monitored on TID basis and have been boderline. His heart rate continued   Diabetes has been monitored with ac/hs CBG checks and SSI was use prn for tighter BS control.   Current Medications:  aspirin  81 mg Oral Daily   clopidogrel  75 mg Oral Daily   enoxaparin (LOVENOX) injection  40 mg Subcutaneous Daily   irbesartan  37.5 mg Oral Daily   [START ON 10/10/2021] metoprolol succinate  50 mg Oral Daily  pantoprazole  40 mg Oral Daily   rosuvastatin  20 mg Oral Daily      Diet: Dysphagia 3, Nectar liquids. Minced meats.   Disposition:  Acute hospital/Tele  Medications at time of dictation 1.  Tylenol as needed 2.  Plavix 75 mg p.o. daily 3.  Lovenox 40 mg subcutaneous daily 4.  Avapro 37.5 mg p.o. daily 5.  Robaxin 500 mg every 6 hours as needed muscle spasms 6.  Toprol-XL 50 mg daily 7.  Protonix 40 mg p.o. daily 8.  Crestor 20 mg p.o. daily  30-35 minutes were spent completing discharge summary and discharge planning   Signed: Bary Leriche 10/09/2021, 10:24 AM

## 2021-10-09 NOTE — Progress Notes (Signed)
Patient transferred from 4W at 1545hrs.  Oriented to unit and plan of care for shift.

## 2021-10-10 DIAGNOSIS — I639 Cerebral infarction, unspecified: Secondary | ICD-10-CM

## 2021-10-10 DIAGNOSIS — E440 Moderate protein-calorie malnutrition: Secondary | ICD-10-CM

## 2021-10-10 DIAGNOSIS — E785 Hyperlipidemia, unspecified: Secondary | ICD-10-CM | POA: Diagnosis not present

## 2021-10-10 DIAGNOSIS — I10 Essential (primary) hypertension: Secondary | ICD-10-CM | POA: Diagnosis not present

## 2021-10-10 DIAGNOSIS — I471 Supraventricular tachycardia: Secondary | ICD-10-CM | POA: Diagnosis not present

## 2021-10-10 LAB — BASIC METABOLIC PANEL
Anion gap: 7 (ref 5–15)
BUN: 18 mg/dL (ref 8–23)
CO2: 29 mmol/L (ref 22–32)
Calcium: 9 mg/dL (ref 8.9–10.3)
Chloride: 101 mmol/L (ref 98–111)
Creatinine, Ser: 0.96 mg/dL (ref 0.61–1.24)
GFR, Estimated: >60 mL/min (ref >60)
Glucose, Bld: 102 mg/dL — ABNORMAL HIGH (ref 70–99)
Potassium: 4 mmol/L (ref 3.5–5.1)
Sodium: 137 mmol/L (ref 135–145)

## 2021-10-10 LAB — GLUCOSE, CAPILLARY: Glucose-Capillary: 130 mg/dL — ABNORMAL HIGH (ref 70–99)

## 2021-10-10 LAB — CBC
HCT: 34.8 % — ABNORMAL LOW (ref 39.0–52.0)
Hemoglobin: 11.5 g/dL — ABNORMAL LOW (ref 13.0–17.0)
MCH: 31.2 pg (ref 26.0–34.0)
MCHC: 33 g/dL (ref 30.0–36.0)
MCV: 94.3 fL (ref 80.0–100.0)
Platelets: 271 10*3/uL (ref 150–400)
RBC: 3.69 MIL/uL — ABNORMAL LOW (ref 4.22–5.81)
RDW: 12.8 % (ref 11.5–15.5)
WBC: 6 10*3/uL (ref 4.0–10.5)
nRBC: 0 % (ref 0.0–0.2)

## 2021-10-10 LAB — MAGNESIUM: Magnesium: 1.7 mg/dL (ref 1.7–2.4)

## 2021-10-10 MED ORDER — METOPROLOL SUCCINATE ER 25 MG PO TB24
25.0000 mg | ORAL_TABLET | Freq: Every day | ORAL | Status: DC
  Administered 2021-10-11 – 2021-10-15 (×5): 25 mg via ORAL
  Filled 2021-10-10 (×5): qty 1

## 2021-10-10 MED ORDER — MAGNESIUM SULFATE IN D5W 1-5 GM/100ML-% IV SOLN
1.0000 g | Freq: Once | INTRAVENOUS | Status: AC
Start: 2021-10-10 — End: 2021-10-10
  Administered 2021-10-10: 1 g via INTRAVENOUS
  Filled 2021-10-10: qty 100

## 2021-10-10 MED ORDER — ADULT MULTIVITAMIN W/MINERALS CH
1.0000 | ORAL_TABLET | Freq: Every day | ORAL | Status: DC
Start: 2021-10-10 — End: 2021-10-15
  Administered 2021-10-10 – 2021-10-15 (×6): 1 via ORAL
  Filled 2021-10-10 (×6): qty 1

## 2021-10-10 NOTE — Evaluation (Addendum)
Physical Therapy Evaluation Patient Details Name: Javier [name] MRN: [id number] DOB: [birth year] Today's Date: 10/10/2021  History of Present Illness  76 yo male admitted from [facility] 6/7 with SVT. 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 PMHx: GERD, diverticulosis, HLD, HTN  Clinical Impression  Pt pleasant with decreased awareness of situation for returning to hospital from [facility]. Pt oriented to time and able to recall room number. Pt with maintained ataxia of left side, decreased cognition and awareness, decreased balance all limiting his functional mobility and independence. Pt will benefit from acute therapy to maximize mobility, safety and independence to decrease burden of care.   HR 46-96 SpO2 100% on RA       Recommendations for follow up therapy are one component of a multi-disciplinary discharge planning process, led by the attending physician.  Recommendations may be updated based on patient status, additional functional criteria and insurance authorization.  Follow Up Recommendations Acute inpatient rehab    Assistance Recommended at Discharge Frequent or constant Supervision/Assistance  Patient can return home with the following  A little help with walking and/or transfers;A little help with bathing/dressing/bathroom;Assistance with cooking/housework;Direct supervision/assist for financial management;Assist for transportation;Help with stairs or ramp for entrance;Direct supervision/assist for medications management    Equipment Recommendations BSC/3in1;Rolling walker (2 wheels)  Recommendations for Other Services       Functional Status Assessment Patient has had a recent decline in their functional status and demonstrates the ability to make significant improvements in function in a reasonable and predictable amount of time.     Precautions / Restrictions Precautions Precautions: Fall;Other (comment) Precaution Comments: HOH, dysphagia diet, L hemibody ataxia       Mobility  Bed Mobility Overal bed mobility: Modified Independent             General bed mobility comments: pt able to transition to sitting with bed flat and use of rail    Transfers Overall transfer level: Needs assistance   Transfers: Sit to/from Stand Sit to Stand: Min guard           General transfer comment: guarding for safety with HHA without RW present and with RW present only guarding. x 2 trials    Ambulation/Gait Ambulation/Gait assistance: Min guard Gait Distance (Feet): 400 Feet Assistive device: Rolling walker (2 wheels) Gait Pattern/deviations: Step-through pattern, Decreased stride length, Ataxic, Decreased stance time - left   Gait velocity interpretation: 1.31 - 2.62 ft/sec, indicative of limited community ambulator   General Gait Details: pt with mildly ataxic movement of LLE with stepping, reliant on RW with partially flexed trunk with cues for safety and proximity to RW. HR max 94  Stairs            Wheelchair Mobility    Modified Rankin (Stroke Patients Only)       Balance Overall balance assessment: Needs assistance   Sitting balance-Leahy Scale: Fair Sitting balance - Comments: static sitting and donning shoe with supervision eOB   Standing balance support: Single extremity supported, Bilateral upper extremity supported Standing balance-Leahy Scale: Poor Standing balance comment: single and bil UE support in standing and with gait                             Pertinent Vitals/Pain Pain Assessment Pain Assessment: No/denies pain    Home Living Family/patient expects to be discharged to:: Private residence Living Arrangements: Spouse/significant other   Type of Home: House Home Access: Stairs to enter Entrance  Stairs-Rails: None Entrance Stairs-Number of Steps: 2   Home Layout: One level Home Equipment: None      Prior Function Prior Level of Function : Needs assist             Mobility Comments:  Patient reports ambulating independently prior to CVA. Patient was not driving ADLs Comments: Pt reports independence with ADL's with family assisting IADL's.     Hand Dominance        Extremity/Trunk Assessment   Upper Extremity Assessment RUE Deficits / Details: grossly 3/5 LUE Deficits / Details: grossly 3/5 not formally tested with ataxic movement reaching for RW and tying shoe LUE Coordination: decreased fine motor;decreased gross motor    Lower Extremity Assessment Lower Extremity Assessment: LLE deficits/detail LLE Deficits / Details: ataxic with stepping with increased ankle inversion and lateral excursion with stepping    Cervical / Trunk Assessment Cervical / Trunk Assessment: Normal  Communication   Communication: No difficulties  Cognition Arousal/Alertness: Awake/alert Behavior During Therapy: WFL for tasks assessed/performed Overall Cognitive Status: Impaired/Different from baseline Area of Impairment: Orientation, Attention, Memory, Following commands, Safety/judgement                 Orientation Level: Disoriented to, Situation Current Attention Level: Sustained Memory: Decreased short-term memory Following Commands: Follows one step commands consistently, Follows one step commands with increased time Safety/Judgement: Decreased awareness of deficits, Decreased awareness of safety     General Comments: pt unable to recall how to call 911, unaware of situation, able to place shoes on his feet but could not tie, able to recall room number with gait and locate appropriately        General Comments      Exercises     Assessment/Plan    PT Assessment Patient needs continued PT services  PT Problem List Decreased strength;Decreased activity tolerance;Decreased balance;Decreased mobility;Decreased coordination;Decreased cognition       PT Treatment Interventions DME instruction;Gait training;Stair training;Functional mobility training;Therapeutic  activities;Therapeutic exercise;Balance training;Neuromuscular re-education    PT Goals (Current goals can be found in the Care Plan section)  Acute Rehab PT Goals Patient Stated Goal: return home PT Goal Formulation: With patient Time For Goal Achievement: 10/24/21 Potential to Achieve Goals: Good    Frequency Min 3X/week     Co-evaluation               AM-PAC PT "6 Clicks" Mobility  Outcome Measure Help needed turning from your back to your side while in a flat bed without using bedrails?: None Help needed moving from lying on your back to sitting on the side of a flat bed without using bedrails?: A Little Help needed moving to and from a bed to a chair (including a wheelchair)?: A Little Help needed standing up from a chair using your arms (e.g., wheelchair or bedside chair)?: A Little Help needed to walk in hospital room?: A Little Help needed climbing 3-5 steps with a railing? : A Lot 6 Click Score: 18    End of Session Equipment Utilized During Treatment: Gait belt Activity Tolerance: Patient tolerated treatment well Patient left: in chair;with call bell/phone within reach;with chair alarm set Nurse Communication: Mobility status PT Visit Diagnosis: Unsteadiness on feet (R26.81);Other abnormalities of gait and mobility (R26.89);Muscle weakness (generalized) (M62.81);Ataxic gait (R26.0)    Time: 1610-9604 PT Time Calculation (min) (ACUTE ONLY): 25 min   Charges:   PT Evaluation $PT Eval Moderate Complexity: 1 Mod PT Treatments $Gait Training: 8-22 mins  [facility] Office: [phone]   Lamarr Lulas 10/10/2021, 8:26 AM

## 2021-10-10 NOTE — Evaluation (Signed)
Clinical/Bedside Swallow Evaluation Patient Details  Name: BRADON FESTER MRN: [id number] Date of Birth: 10-23-1945  Today's Date: 10/10/2021 Time: SLP Start Time (ACUTE ONLY): 0910 SLP Stop Time (ACUTE ONLY): 0927 SLP Time Calculation (min) (ACUTE ONLY): 17 min  Past Medical History:  Past Medical History:  Diagnosis Date   Cataract    HLD (hyperlipidemia)    Hypertension    Past Surgical History:  Past Surgical History:  Procedure Laterality Date   COLONOSCOPY  11/19/2009   RMR: 1. Multiple small rectal polyps status post snare polypectomy. 2. Multiple left colon polyp status post snare removal . Poor prep compromised exam.    COLONOSCOPY N/A 12/13/2014   Procedure: COLONOSCOPY;  Surgeon: Daneil Dolin, MD;  [facility]: [facility];  Service: Endoscopy;  Laterality: N/A;  945   COLONOSCOPY N/A 07/18/2020   Procedure: COLONOSCOPY;  Surgeon: Daneil Dolin, MD;  Location: [facility];  Service: Endoscopy;  Laterality: N/A;  am   FLEXIBLE SIGMOIDOSCOPY N/A 04/25/2020   Procedure: FLEXIBLE SIGMOIDOSCOPY;  Surgeon: Daneil Dolin, MD; incomplete colonoscopy with flex sig only due to poor prep.   HERNIA REPAIR     POLYPECTOMY  07/18/2020   Procedure: POLYPECTOMY;  Surgeon: Daneil Dolin, MD;  Location: [facility];  Service: Endoscopy;;   HPI:  76 yo male admitted from [facility] 6/7 with SVT. 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 MBS on 5/26 showed trace aspiration of thin liquids with large sips. Initially pt started on thin/dys 3, but due to coughing after meals and impulsivity pt downgraded to nectar thick liquids. Advancement complicated on CIR by intermittent coughing at basline and following meals. Subjectively pt appeared to be tolerating nectar diet with soft solids. CXR on readmission negative for acute finding. PMHx: GERD, diverticulosis, HLD, HTN    Assessment / Plan / Recommendation  Clinical Impression  Pt seen up in chair, just finishing dys 3 tray with nectar  thick liquids. Pt with no delayed coughing after meal. Able to self feed without dentition. Provided trials of thin liquids; pt independently took small sips and refused a straw. No throat clearing or coughing observed. Given that he would not consume further solids with thin liquids to test for mixed consistencies and tolerance throughout a meal, will defer upgrade at this time. Pt to continue dys 3 diet and nectar thick liquids but SLP will f/u for trials of thin liquids with a meal for potential upgrade. SLP Visit Diagnosis: Dysphagia, oropharyngeal phase (R13.12)    Aspiration Risk  Mild aspiration risk    Diet Recommendation Dysphagia 3 (Mech soft);Nectar-thick liquid   Liquid Administration via: Cup Medication Administration: Whole meds with puree Supervision: Patient able to self feed;Intermittent supervision to cue for compensatory strategies Compensations: Slow rate;Small sips/bites;Minimize environmental distractions;Multiple dry swallows after each bite/sip;Clear throat intermittently Postural Changes: Seated upright at 90 degrees    Other  Recommendations      Recommendations for follow up therapy are one component of a multi-disciplinary discharge planning process, led by the attending physician.  Recommendations may be updated based on patient status, additional functional criteria and insurance authorization.  Follow up Recommendations Acute inpatient rehab (3hours/day)      Assistance Recommended at Discharge Frequent or constant Supervision/Assistance  Functional Status Assessment Patient has had a recent decline in their functional status and demonstrates the ability to make significant improvements in function in a reasonable and predictable amount of time.  Frequency and Duration min 2x/week  2 weeks  Prognosis Prognosis for Safe Diet Advancement: Good Barriers to Reach Goals: Cognitive deficits      Swallow Study   General HPI: 76 yo male admitted from [facility]  6/7 with SVT. 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 MBS on 5/26 showed trace aspiration of thin liquids with large sips. Initially pt started on thin/dys 3, but due to coughing after meals and impulsivity pt downgraded to nectar thick liquids. Advancement complicated on CIR by intermittent coughing at basline and following meals. Subjectively pt appeared to be tolerating nectar diet with soft solids. CXR on readmission negative for acute finding. PMHx: GERD, diverticulosis, HLD, HTN Type of Study: Bedside Swallow Evaluation Previous Swallow Assessment: see impression Diet Prior to this Study: Dysphagia 3 (soft);Nectar-thick liquids Temperature Spikes Noted: No Respiratory Status: Room air History of Recent Intubation: No Behavior/Cognition: Alert;Cooperative;Impulsive Oral Cavity Assessment: Within Functional Limits Oral Care Completed by SLP: No Oral Cavity - Dentition: Edentulous;Dentures, not available Vision: Functional for self-feeding Self-Feeding Abilities: Able to feed self Patient Positioning: Upright in chair Baseline Vocal Quality: Normal Volitional Cough: Strong Volitional Swallow: Able to elicit    Oral/Motor/Sensory Function Overall Oral Motor/Sensory Function: Within functional limits   Ice Chips     Thin Liquid Thin Liquid: Within functional limits Presentation: Cup;Self Fed    Nectar Thick Nectar Thick Liquid: Within functional limits   Honey Thick Honey Thick Liquid: Not tested   Puree Puree: Not tested   Solid     Solid: Within functional limits      Simrit Gohlke, [name] 10/10/2021,9:28 AM

## 2021-10-10 NOTE — Evaluation (Signed)
Occupational Therapy Evaluation Patient Details Name: Javier [name] MRN: [id number] DOB: February 16, 1946 Today's Date: 10/10/2021   History of Present Illness 76 yo male admitted from [facility] 6[facility]7 with SVT. 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 PMHx: GERD, diverticulosis, HLD, HTN   Clinical Impression   Pt admitted for concerns listed above. PTA was independent with all ADL's and functional mobility. At this time, pt presents with weakness, balance deficits, cognitive deficits, and decreased activity tolerance. He is requiring min to mid A with all ADL's and functional mobility. Dynamic standing tasks, requiring increased support and assist for steadying. Recommending pt return to [facility] to maximize independence and safety. OT will follow acutely.      Recommendations for follow up therapy are one component of a multi-disciplinary discharge planning process, led by the attending physician.  Recommendations may be updated based on patient status, additional functional criteria and insurance authorization.   Follow Up Recommendations  Acute inpatient rehab (3hours/day)    Assistance Recommended at Discharge Intermittent Supervision/Assistance  Patient can return home with the following A little help with walking and/or transfers;A lot of help with bathing/dressing/bathroom;Assistance with cooking/housework;Direct supervision/assist for medications management;Direct supervision/assist for financial management    Functional Status Assessment  Patient has had a recent decline in their functional status and demonstrates the ability to make significant improvements in function in a reasonable and predictable amount of time.  Equipment Recommendations  None recommended by OT    Recommendations for Other Services Rehab consult     Precautions / Restrictions Precautions Precautions: Fall;Other (comment) Precaution Comments: HOH, dysphagia diet, L hemibody ataxia Restrictions Weight Bearing  Restrictions: No      Mobility Bed Mobility Overal bed mobility: Modified Independent             General bed mobility comments: no difficulties    Transfers Overall transfer level: Needs assistance Equipment used: Rolling walker (2 wheels) Transfers: Sit to/from Stand Sit to Stand: Min assist           General transfer comment: Min A to steady, heavy min A from toilet      Balance Overall balance assessment: Needs assistance Sitting-balance support: Feet supported Sitting balance-Leahy Scale: Good     Standing balance support: Bilateral upper extremity supported, Single extremity supported, During functional activity Standing balance-Leahy Scale: Poor Standing balance comment: single and bil UE support in standing and with gait                           ADL either performed or assessed with clinical judgement   ADL Overall ADL's : Needs assistance/impaired Eating/Feeding: Set up;Sitting;Minimal assistance   Grooming: Minimal assistance;Sitting   Upper Body Bathing: Minimal assistance;Sitting   Lower Body Bathing: Moderate assistance;Sitting/lateral leans;Sit to/from stand   Upper Body Dressing : Minimal assistance;Sitting   Lower Body Dressing: Moderate assistance;Sitting/lateral leans;Sit to/from stand   Toilet Transfer: Minimal assistance;Ambulation   Toileting- Clothing Manipulation and Hygiene: Minimal assistance;Sitting/lateral lean;Sit to/from stand       Functional mobility during ADLs: Minimal assistance;Rolling walker (2 wheels) General ADL Comments: Pt requiring min-mod A for all ADL's and functional mobility due to weakness, balance deficits, and cognitive concerns     Vision Ability to See in Adequate Light: 0 Adequate Patient Visual Report: No change from baseline Vision Assessment?: Vision impaired- to be further tested in functional context Additional Comments: Pt has R visual deficits at baseline.     Perception  Praxis      Pertinent Vitals/Pain Pain Assessment Pain Assessment: No/denies pain     Hand Dominance Right   Extremity/Trunk Assessment Upper Extremity Assessment Upper Extremity Assessment: Generalized weakness   Lower Extremity Assessment Lower Extremity Assessment: Defer to PT evaluation   Cervical / Trunk Assessment Cervical / Trunk Assessment: Normal   Communication Communication Communication: No difficulties   Cognition Arousal/Alertness: Awake/alert Behavior During Therapy: WFL for tasks assessed/performed Overall Cognitive Status: Impaired/Different from baseline Area of Impairment: Orientation, Attention, Memory, Following commands, Safety/judgement                 Orientation Level: Disoriented to, Situation Current Attention Level: Sustained Memory: Decreased short-term memory Following Commands: Follows one step commands consistently, Follows one step commands with increased time Safety/Judgement: Decreased awareness of deficits, Decreased awareness of safety     General Comments: unaware of situation, able to place shoes on his feet but could not tie, requiring 1 step commands     General Comments  VSS on RA    Exercises     Shoulder Instructions      Home Living Family/patient expects to be discharged to:: Private residence Living Arrangements: Spouse/significant other Available Help at Discharge: Family;Available 24 hours/day Type of Home: House Home Access: Stairs to enter [employer] of Steps: 2 Entrance Stairs-Rails: None Home Layout: One level     Bathroom Shower/Tub: Teacher, early years/pre: Handicapped height     Home Equipment: None          Prior Functioning/Environment Prior Level of Function : Needs assist             Mobility Comments: Patient reports ambulating independently prior to CVA. Patient was not driving ADLs Comments: Pt reports independence with ADL's with family assisting  IADL's.        OT Problem List: Decreased strength;Decreased activity tolerance;Impaired balance (sitting and/or standing);Decreased coordination;Decreased cognition;Decreased safety awareness      OT Treatment/Interventions: Self-care/ADL training;Therapeutic exercise;Energy conservation;DME and/or AE instruction;Therapeutic activities;Cognitive remediation/compensation;Patient/family education;Balance training    OT Goals(Current goals can be found in the care plan section) Acute Rehab OT Goals Patient Stated Goal: To go home OT Goal Formulation: With patient Time For Goal Achievement: 10/24/21 Potential to Achieve Goals: Good ADL Goals Pt Will Perform Grooming: with modified independence;sitting Pt Will Perform Lower Body Bathing: with modified independence;sitting/lateral leans;sit to/from stand Pt Will Perform Lower Body Dressing: with modified independence;sitting/lateral leans;sit to/from stand Pt Will Transfer to Toilet: with modified independence;ambulating Pt Will Perform Toileting - Clothing Manipulation and hygiene: with modified independence;sitting/lateral leans;sit to/from stand  OT Frequency: Min 2X/week    Co-evaluation              AM-PAC OT "6 Clicks" Daily Activity     Outcome Measure Help from another person eating meals?: A Little Help from another person taking care of personal grooming?: A Little Help from another person toileting, which includes using toliet, bedpan, or urinal?: A Little Help from another person bathing (including washing, rinsing, drying)?: A Lot Help from another person to put on and taking off regular upper body clothing?: A Little Help from another person to put on and taking off regular lower body clothing?: A Lot 6 Click Score: 16   End of Session Equipment Utilized During Treatment: Gait belt;Rolling walker (2 wheels) Nurse Communication: Mobility status  Activity Tolerance: Patient tolerated treatment well Patient left: in  chair;with call bell/phone within reach;with chair alarm set  OT Visit Diagnosis: Unsteadiness  on feet (R26.81);Other abnormalities of gait and mobility (R26.89);Ataxia, unspecified (R27.0);Muscle weakness (generalized) (M62.81)                Time: 1555-1610 OT Time Calculation (min): 15 min Charges:  OT General Charges $OT Visit: 1 Visit OT Evaluation $OT Eval Moderate Complexity: 1 Mod  Olita Takeshita H., OTR/L Acute Rehabilitation  Cris Gibby Elane Yolanda Bonine 10/10/2021, 7:23 PM

## 2021-10-10 NOTE — Progress Notes (Signed)
Initial Nutrition Assessment  DOCUMENTATION CODES:   Non-severe (moderate) malnutrition in context of social or environmental circumstances  INTERVENTION:  Vital Cuisine Shake BID, each supplement provides 520 kcal and 22 grams of protein MVI with minerals daily  NUTRITION DIAGNOSIS:   Moderate Malnutrition related to social / environmental circumstances as evidenced by moderate fat depletion, moderate muscle depletion, severe muscle depletion.  GOAL:   Patient will meet greater than or equal to 90% of their needs  MONITOR:   PO intake, Supplement acceptance, Labs, Weight trends  REASON FOR ASSESSMENT:   Consult Other (Comment) (nutrition goals)  ASSESSMENT:   Pt admitted 05/24-5/30 with acute cerebellar CVA. Pt was d/c to CIR. Pt developed SVT and continues with persistent symptoms and was transferred back to the floor for ongoing treatment. PMH significant for HTN and HLD.  Pt sitting up in bed. He provided limited history. Reports that he has a good appetite and has been eating well although did not quantify how much. He states that PTA he was eating 2-3 meals per day on a variety of foods such a eggs, bacon, and other meats. During assessment, pt had questions and expressed concerns over going back to rehab and whether or not he will be able to walk again.   Meal completions: 06/07: 75%-dinner  Pt unable to provide wt hx and was unsure if he's had recent wt loss or if his clothes have been fitting more loosely. Reviewed wt hx. There is limited documentation of wt's within the last year. Pt's current wt appears stable- 69 kg. Will continue to monitor throughout admission.  Pt meets criteria for malnutrition. Per review of chart, noted outpatient physician questioning of underlying mental delay/early dementia. Suspect malnutrition to be more chronic in nature. If pt has underlying early dementia, this could likely be attributing to presence of malnutrition.   Medications:  colace, protonix, IV Mg sulfate  Labs: reviewed  NUTRITION - FOCUSED PHYSICAL EXAM:  Flowsheet Row Most Recent Value  Orbital Region Moderate depletion  Upper Arm Region Moderate depletion  Thoracic and Lumbar Region Mild depletion  Buccal Region Moderate depletion  Temple Region Moderate depletion  Clavicle Bone Region Moderate depletion  Clavicle and Acromion Bone Region Moderate depletion  Scapular Bone Region Moderate depletion  Dorsal Hand Mild depletion  Patellar Region Severe depletion  Anterior Thigh Region Severe depletion  Posterior Calf Region Moderate depletion  Edema (RD Assessment) None  Hair Reviewed  Eyes Reviewed  Mouth Reviewed  Skin Reviewed  Nails Reviewed      Diet Order:   Diet Order             DIET DYS 3 Room service appropriate? Yes; Fluid consistency: Nectar Thick  Diet effective now                  EDUCATION NEEDS:   Education needs have been addressed  Skin:  Skin Assessment: Reviewed RN Assessment  Last BM:  6/8 (type 4)  Height:   Ht Readings from Last 1 Encounters:  10/09/21 '5\' 11"'$  (1.803 m)    Weight:   Wt Readings from Last 1 Encounters:  10/09/21 69 kg   BMI:  Body mass index is 21.22 kg/m.  Estimated Nutritional Needs:   Kcal:  1900-2100  Protein:  90-105g  Fluid:  >/=1.9L  Clayborne Dana, RDN, LDN Clinical Nutrition

## 2021-10-10 NOTE — Progress Notes (Signed)
Speech Therapy Discharge Note  This patient was unable to complete the inpatient rehab program due to change in medical status; therefore did not meet their long term goals and has been discharged from skilled SLP services at this time.The patient left the program at a Mod assist level for overall cognitive functioning. The patient is currently consuming Dys. 3 textures with nectar-thick liquids with Mod assist for use of swallowing compensatory strategies.   See CareTool for functional status details.  If the patient is able to return to inpatient rehabilitation within 3 midnights, this may be considered an interrupted stay and therapy services will resume as ordered. Modification and reinstatement of their goals will be made upon completion of therapy service reevaluations.

## 2021-10-10 NOTE — Progress Notes (Addendum)
Progress Note  Patient Name: Javier [name] Date of Encounter: 10/10/2021  [facility] Cardiologist: Donato Heinz, MD   Subjective   No acute overnight events. No recurrent SVT. Currently in sinus rhythm with rates in the high 40s at rest. No complaints this morning. No chest pain, shortness of breath, or palpitations.  Inpatient Medications    Scheduled Meds:  amiodarone  200 mg Oral BID   aspirin EC  81 mg Oral Daily   clopidogrel  75 mg Oral Daily   docusate sodium  100 mg Oral BID   enoxaparin (LOVENOX) injection  40 mg Subcutaneous Q24H   irbesartan  37.5 mg Oral Daily   metoprolol succinate  50 mg Oral Daily   pantoprazole  40 mg Oral BID   rosuvastatin  20 mg Oral Daily   sodium chloride flush  3 mL Intravenous Q12H   Continuous Infusions:  magnesium sulfate bolus IVPB     PRN Meds: acetaminophen **OR** acetaminophen, bisacodyl, hydrALAZINE, methocarbamol, morphine injection, ondansetron **OR** ondansetron (ZOFRAN) IV, oxyCODONE, polyethylene glycol   Vital Signs    Vitals:   10/09/21 2025 10/10/21 0021 10/10/21 0819 10/10/21 0840  BP: 130/65 136/70  126/68  Pulse: (!) 58 (!) 56 94 (!) 59  Resp: '20 16  18  '$ Temp: 97.7 F (36.5 C) 97.9 F (36.6 C)  (!) 97.5 F (36.4 C)  TempSrc: Oral Oral  Oral  SpO2: 95% 96%    Weight:      Height:        Intake/Output Summary (Last 24 hours) at 10/10/2021 1122 Last data filed at 10/10/2021 0500 Gross per 24 hour  Intake 480 ml  Output 600 ml  Net -120 ml      10/09/2021    4:50 PM 10/08/2021    4:56 PM 09/25/2021   10:38 AM  Last 3 Weights  Weight (lbs) 152 lb 1.9 oz 151 lb 14.4 oz 154 lb  Weight (kg) 69 kg 68.9 kg 69.854 kg      Telemetry    Sinus rhythm with rates ranging from the high 40s (at rest) to 90s (suspect with exertion). - Personally Reviewed  ECG    No new ECG tracing today. - Personally Reviewed  Physical Exam   GEN: Thin African-American male in no acute distress.   Neck: No  JVD. Cardiac: Bradycardic with normal rhythm. No murmurs, rubs, or gallops.  Respiratory: Clear to auscultation bilaterally. No wheezes, rhonchi, or rales. GI: Soft, non-distended, and non-tender.  MS: No lower extremity edema. No deformity. Skin: Warm and dry. Neuro:  No focal deficits. Psych: Normal affect. Responds appropriately.   Labs    High Sensitivity Troponin:  No results for input(s): "TROPONINIHS" in the last 720 hours.   Chemistry Recent Labs  Lab 10/04/21 0547 10/07/21 0622 10/07/21 1631 10/10/21 0115  NA 136 135  --  137  K 4.2 3.8  --  4.0  CL 100 102  --  101  CO2 30 28  --  29  GLUCOSE 103* 104*  --  102*  BUN 11 14  --  18  CREATININE 0.96 0.83  --  0.96  CALCIUM 8.8* 8.8*  --  9.0  MG  --   --  1.7 1.7  GFRNONAA >60 >60  --  >60  ANIONGAP 6 5  --  7    Lipids No results for input(s): "CHOL", "TRIG", "HDL", "LABVLDL", "LDLCALC", "CHOLHDL" in the last 168 hours.  Hematology Recent Labs  Lab  10/07/21 0622 10/10/21 0115  WBC 6.5 6.0  RBC 3.79* 3.69*  HGB 12.0* 11.5*  HCT 35.2* 34.8*  MCV 92.9 94.3  MCH 31.7 31.2  MCHC 34.1 33.0  RDW 12.9 12.8  PLT 262 271   Thyroid  Recent Labs  Lab 10/07/21 1631  TSH 1.710    BNPNo results for input(s): "BNP", "PROBNP" in the last 168 hours.  DDimer No results for input(s): "DDIMER" in the last 168 hours.   Radiology    DG Chest 2 View  Result Date: 10/08/2021 CLINICAL DATA:  Cough.  Episode of tachycardia yesterday. EXAM: CHEST - 2 VIEW COMPARISON:  None Available. FINDINGS: The cardiomediastinal silhouette is within normal limits. No airspace consolidation, edema, pleural effusion, or pneumothorax is identified. Multiple old left-sided rib fractures are noted. IMPRESSION: No active cardiopulmonary disease. Electronically Signed   By: Logan Bores M.D.   On: 10/08/2021 15:10    Cardiac Studies   Echocardiogram 09/25/2021: Impressions:  1. Left ventricular ejection fraction, by estimation, is 65 to 70%.  The  left ventricle has normal function. The left ventricle has no regional  wall motion abnormalities. There is mild asymmetric left ventricular  hypertrophy of the basal segment. Left  ventricular diastolic parameters were normal.   2. Right ventricular systolic function is normal. The right ventricular  size is normal. There is normal pulmonary artery systolic pressure. The  estimated right ventricular systolic pressure is 86.5 mmHg.   3. Left atrial size was mildly dilated.   4. The mitral valve is grossly normal. Trivial mitral valve  regurgitation.   5. The aortic valve is tricuspid. Aortic valve regurgitation is not  visualized. Aortic valve mean gradient measures 2.0 mmHg.   6. Aortic dilatation noted. There is mild dilatation of the aortic root,  measuring 40 mm.   7. The inferior vena cava is normal in size with greater than 50%  respiratory variability, suggesting right atrial pressure of 3 mmHg.   Comparison(s): No prior Echocardiogram.    Patient Profile     76 y.o. 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 He was admitted on 09/25/2021 with CVA after presenting with dizziness and poor balance. He was discharged to CIR on 10/01/2021. Cardiology was consulted on 10/07/2021 for evaluation of tachycardia and patient was noted to be in SVT with rates in the 140s. He was transferred back to inpatient side on 10/09/2021 due to persistent SVT and need for telemetry.  Assessment & Plan    Paroxysmal SVT Patient has had multiple episodes during this admission - all self terminating. Rates as high as the 150s. TSH normal. Echo shows normal biventricular function and no significant valvular disease. - No recurrent SVT on Amiodarone. Rates currently in the high 40s at rest. - Potassium 4.0 today. - Magnesium 1.7 today. Being supplemented by primary team. - Continue PO Amiodarone '200mg'$  twice daily. - Continue Toprol-XL, will  reduce dose to 25 mg daily given rates in 40s  Hypertension BP well controlled. - Continue Irbesartan 37.'5mg'$  daily. - Continue Toprol-XL, reduced to 25 mg daily as above  Recent CVA - Continue DAPT with Aspirin and Plavix. - Continue Crestor '20mg'$  daily. - Will plan for 30 day Event Monitor at discharge.   For questions or updates, please contact [place] Please consult [email or web address] for contact info under        Signed, Darreld Mclean, PA-C  10/10/2021, 11:22 AM    Patient seen and examined.  Agree with above documentation.  On exam, patient is alert and oriented, bradycardic, regular, no murmurs, lungs CTAB, no LE edema or JVD.  Telemetry shows no further SVT since transferred to 6 E.  Has had low resting heart rate, down to 40s.  We will need to decrease metoprolol dose to 25 mg daily.  Can continue amiodarone load.  If maintaining sinus rhythm, possible transfer back to CIR tomorrow.  Donato Heinz, MD

## 2021-10-10 NOTE — Progress Notes (Signed)
  Progress Note  Patient: Javier [name] [id number]:[id number] DOB: [birth year]  DOA: 10/09/2021  DOS: 10/10/2021    Brief hospital course: Javier [name] is a 76 y.o. male with a history of HTN, HLD, alcohol abuse and recent admission 5/24 - 5/30 for acute cerebellar CVA, subsequently discharged to CIR where he was noted to have SVT for which cardiology had been consulting. HR reached 160's during therapy, down to 140 with metoprolol, and he was transferred to inpatient hospital for continuous cardiac monitoring and amiodarone treatment. HR has reached into 40's, so beta blocker is lowered. PT now recommending home health at discharge.   Assessment and Plan: SVT:  - Improving, continue metoprolol, reduced dose per cardiology with sinus bradycardia overnight into 40's.  - Continue amiodarone. SVT burden resolved thus far.  - Planning 30-day cardiac monitor at discharge  Acute right cerebellar CVA with dysphagia:  - SLP evaluating. PT feels patient is stable for discharge home with [facility]. TOC consulted. OT pending.  - Continue DAPT x90 days, then aspirin alone, high-intensity statin.  - Outpatient stroke neurology follow up.   HTN:  - Continue ARB, BB  HLD:  - Continue rosuvastatin  Alcohol abuse:  - No evidence of withdrawal currently.   Moderate protein calorie malnutrition:  - Supplement protein and vitamins.   Subjective: No new complaints. Denies chest pain, dyspnea or palpitations when working with PT. No symptoms overnight with bradycardia while sleeping. BP wnl.  Objective: Vitals:   10/10/21 0819 10/10/21 0840 10/10/21 1150 10/10/21 1525  BP:  126/68  127/67  Pulse: 94 (!) 59  (!) 50  Resp:  '18 18 18  '$ Temp:  (!) 97.5 F (36.4 C) 97.8 F (36.6 C) 97.8 F (36.6 C)  TempSrc:  Oral Oral Oral  SpO2:    99%  Weight:      Height:       Gen: Chronically ill-appearing, frail 76 y.o. male in no distress Pulm: Nonlabored breathing room air. Clear CV: Regular bradycardia without  murmur, rub, or gallop. No JVD, no dependent edema. GI: Abdomen soft, non-tender, non-distended, with normoactive bowel sounds.  Ext: Warm, no deformities Skin: No rashes, lesions or ulcers on visualized skin. Neuro: Alert and oriented. Mild perseveration, mild ataxia. No focal neurological deficits. Psych: Judgement and insight appear fair. Mood euthymic & affect congruent. Behavior is appropriate.    Data Personally reviewed: CBC: Recent Labs  Lab 10/07/21 0622 10/10/21 0115  WBC 6.5 6.0  HGB 12.0* 11.5*  HCT 35.2* 34.8*  MCV 92.9 94.3  PLT 262 408   Basic Metabolic Panel: Recent Labs  Lab 10/04/21 0547 10/07/21 0622 10/07/21 1631 10/10/21 0115  NA 136 135  --  137  K 4.2 3.8  --  4.0  CL 100 102  --  101  CO2 30 28  --  29  GLUCOSE 103* 104*  --  102*  BUN 11 14  --  18  CREATININE 0.96 0.83  --  0.96  CALCIUM 8.8* 8.8*  --  9.0  MG  --   --  1.7 1.7   Family Communication: None at bedside  Disposition: Status is: Inpatient Remains inpatient appropriate because: Continue cardiac monitoring for SVT and bradycardia Planned Discharge Destination: Home with [place], MD 10/10/2021 5:22 PM Page by [email or web address]

## 2021-10-11 DIAGNOSIS — I471 Supraventricular tachycardia: Secondary | ICD-10-CM | POA: Diagnosis not present

## 2021-10-11 DIAGNOSIS — I639 Cerebral infarction, unspecified: Secondary | ICD-10-CM | POA: Diagnosis not present

## 2021-10-11 DIAGNOSIS — I10 Essential (primary) hypertension: Secondary | ICD-10-CM | POA: Diagnosis not present

## 2021-10-11 DIAGNOSIS — E785 Hyperlipidemia, unspecified: Secondary | ICD-10-CM | POA: Diagnosis not present

## 2021-10-11 LAB — BASIC METABOLIC PANEL
Anion gap: 6 (ref 5–15)
BUN: 19 mg/dL (ref 8–23)
CO2: 28 mmol/L (ref 22–32)
Calcium: 9 mg/dL (ref 8.9–10.3)
Chloride: 101 mmol/L (ref 98–111)
Creatinine, Ser: 0.97 mg/dL (ref 0.61–1.24)
GFR, Estimated: >60 mL/min (ref >60)
Glucose, Bld: 98 mg/dL (ref 70–99)
Potassium: 4.3 mmol/L (ref 3.5–5.1)
Sodium: 135 mmol/L (ref 135–145)

## 2021-10-11 LAB — GLUCOSE, CAPILLARY
Glucose-Capillary: 98 mg/dL (ref 70–99)
Glucose-Capillary: 98 mg/dL (ref 70–99)
Glucose-Capillary: 101 mg/dL — ABNORMAL HIGH (ref 70–99)

## 2021-10-11 LAB — MAGNESIUM: Magnesium: 1.9 mg/dL (ref 1.7–2.4)

## 2021-10-11 NOTE — TOC Progression Note (Signed)
Transition of Care [facility]) - Progression Note    Patient Details  Name: Javier [name] MRN: [id number] Date of Birth: Jul 14, 1945  Transition of Care [facility]) CM/SW [place], RN Phone Number: 10/11/2021, 1:45 PM  Clinical Narrative:      Discussed case among CIR, PT and OT and nursing. Patient is being recommended for CIR post hospitalization. Was  on CIR prior to hospitalization.  CIR is putting into insurance for approval.      Expected Discharge Plan and Services      CIR     Expected Discharge Date: 10/11/21                                     Social Determinants of Health (SDOH) Interventions    Readmission Risk Interventions     No data to display

## 2021-10-11 NOTE — Progress Notes (Signed)
Speech Language Pathology Treatment: Dysphagia  Patient Details Name: Javier [name] MRN: [id number] DOB: 06-02-1945 Today's Date: 10/11/2021 Time: 9323-5573 SLP Time Calculation (min) (ACUTE ONLY): 28 min  Assessment / Plan / Recommendation Clinical Impression  Pt demonstrates stable function consistent with all prior reports and MBS. Pt has slight delayed cough with thin and nectar thick liquids. Pt takes small sips of thins independently if given a straw. Given overall tolerance and minimal dysphagia on MBS (trace aspiration with large sips) recommend pt resume least restrictive diet to faciltiate ease of care for potential d/c home with wife. Reviewed basicc precautions. Will f/u while admitted, recommend f/u with home  health SLP.   HPI HPI: 76 yo male admitted from [facility] 6/7 with SVT. 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 MBS on 5/26 showed trace aspiration of thin liquids with large sips. Initially pt started on thin/dys 3, but due to coughing after meals and impulsivity pt downgraded to nectar thick liquids. Advancement complicated on CIR by intermittent coughing at basline and following meals. Subjectively pt appeared to be tolerating nectar diet with soft solids. CXR on readmission negative for acute finding. PMHx: GERD, diverticulosis, HLD, HTN      SLP Plan  Continue with current plan of care  Patient needs continued Speech Lanaguage Pathology Services   Recommendations for follow up therapy are one component of a multi-disciplinary discharge planning process, led by the attending physician.  Recommendations may be updated based on patient status, additional functional criteria and insurance authorization.    Recommendations  Diet recommendations: Dysphagia 3 (mechanical soft);Thin liquid Liquids provided via: No straw Medication Administration: Whole meds with puree Supervision: Patient able to self feed;Full supervision/cueing for compensatory strategies;Staff to  assist with self feeding Compensations: Slow rate;Small sips/bites;Minimize environmental distractions;Multiple dry swallows after each bite/sip;Clear throat intermittently Postural Changes and/or Swallow Maneuvers: Seated upright 90 degrees                Follow Up Recommendations: Home health SLP SLP Visit Diagnosis: Cognitive communication deficit (R41.841) Attention and concentration deficit following: Cerebral infarction Plan: Continue with current plan of care           Javier [name], Javier [name]  10/11/2021, 10:12 AM

## 2021-10-11 NOTE — Progress Notes (Signed)
  Progress Note  Patient: Javier [name] MQK:[id number] DOB: 1945-09-15  DOA: 10/09/2021  DOS: 10/11/2021    Brief hospital course: Javier [name] is a 76 y.o. male with a history of HTN, HLD, alcohol abuse and recent admission 5/24 - 5/30 for acute cerebellar CVA, subsequently discharged to CIR where he was noted to have SVT for which cardiology had been consulting. HR reached 160's during therapy, down to 140 with metoprolol, and he was transferred to inpatient hospital for continuous cardiac monitoring and amiodarone treatment. HR has reached into 40's, so beta blocker is lowered with improvement. No further episodes of SVT since transfer from CIR. He has been cleared for discharge by cardiology. Due to progression with rehab, PT/OT/SLP recommend return to CIR to continue rehabilitation.  Assessment and Plan: SVT:  - Resolved >48 hrs. Continue amiodarone '200mg'$  BID.  - Will need cardiology follow up. Planning 30-day cardiac monitor at discharge   Sinus bradycardia: Asymptomatic.  - Continue metoprolol at low dose.    Acute right cerebellar CVA with dysphagia: Has received inpatient rehabilitation services.  - PT, OT, SLP evaluated pt, recommend continuing inpatient rehab.  - Continue DAPT x90 days, then aspirin alone, high-intensity statin.  - Outpatient stroke neurology follow up.    HTN:  - Continue ARB, BB   HLD:  - Continue rosuvastatin   Alcohol abuse:  - No evidence of withdrawal currently.    Moderate protein calorie malnutrition:  - Supplement protein and vitamins.   Subjective: No new complaints. No VT overnight, denies any palpitations, chest pain or dyspnea or lightheadedness overnight.  Objective: Vitals:   10/10/21 1525 10/10/21 1900 10/11/21 0532 10/11/21 1020  BP: 127/67 113/60 125/65 123/65  Pulse: (!) 50 60 (!) 51 81  Resp: '18 17 19 19  '$ Temp: 97.8 F (36.6 C) 98.1 F (36.7 C) 97.8 F (36.6 C) (!) 97.4 F (36.3 C)  TempSrc: Oral Oral Oral Oral   SpO2: 99% 98% 97% 98%  Weight:      Height:      Chronically ill-appearing male in no distress Regular bradycardia, no edema Clear, nonlabored Alert, oriented though cognition is not 100%, he's interactive. Mild ataxia without focal sensory motor weakness.   Data Personally reviewed: CBC: Recent Labs  Lab 10/07/21 0622 10/10/21 0115  WBC 6.5 6.0  HGB 12.0* 11.5*  HCT 35.2* 34.8*  MCV 92.9 94.3  PLT 262 657   Basic Metabolic Panel: Recent Labs  Lab 10/07/21 0622 10/07/21 1631 10/10/21 0115 10/11/21 0727  NA 135  --  137 135  K 3.8  --  4.0 4.3  CL 102  --  101 101  CO2 28  --  29 28  GLUCOSE 104*  --  102* 98  BUN 14  --  18 19  CREATININE 0.83  --  0.96 0.97  CALCIUM 8.8*  --  9.0 9.0  MG  --  1.7 1.7 1.9   Family Communication: None at bedside  Disposition: Status is: Inpatient Remains inpatient appropriate because: Continue cardiac monitoring for SVT and bradycardia Planned Discharge Destination: Home with [place], MD 10/11/2021 1:54 PM Page by [email or web address]

## 2021-10-11 NOTE — Progress Notes (Signed)
Inpatient Rehab Admissions Coordinator:   PT/OT now recommending return to CIR. I will pursue consult and open case with insurance.   Clemens Catholic, [place], [place] Admissions Coordinator  [phone] ([place]) [phone] (office)

## 2021-10-11 NOTE — Progress Notes (Addendum)
Progress Note  Patient Name: Javier [name] Date of Encounter: 10/11/2021  [facility] Cardiologist: Donato Heinz, MD   Subjective   No acute overnight events. Patient is up sitting in chair today and rates are in the 50s. No recurrent SVT. No chest pain, shortness of breath, or palpitations. No complaints today.  Inpatient Medications    Scheduled Meds:  amiodarone  200 mg Oral BID   aspirin EC  81 mg Oral Daily   clopidogrel  75 mg Oral Daily   docusate sodium  100 mg Oral BID   enoxaparin (LOVENOX) injection  40 mg Subcutaneous Q24H   irbesartan  37.5 mg Oral Daily   metoprolol succinate  25 mg Oral Daily   multivitamin with minerals  1 tablet Oral Daily   pantoprazole  40 mg Oral BID   rosuvastatin  20 mg Oral Daily   sodium chloride flush  3 mL Intravenous Q12H   Continuous Infusions:  PRN Meds: acetaminophen **OR** acetaminophen, bisacodyl, hydrALAZINE, methocarbamol, morphine injection, ondansetron **OR** ondansetron (ZOFRAN) IV, oxyCODONE, polyethylene glycol   Vital Signs    Vitals:   10/10/21 1150 10/10/21 1525 10/10/21 1900 10/11/21 0532  BP:  127/67 113/60 125/65  Pulse:  (!) 50 60 (!) 51  Resp: '18 18 17 19  '$ Temp: 97.8 F (36.6 C) 97.8 F (36.6 C) 98.1 F (36.7 C) 97.8 F (36.6 C)  TempSrc: Oral Oral Oral Oral  SpO2:  99% 98% 97%  Weight:      Height:        Intake/Output Summary (Last 24 hours) at 10/11/2021 0856 Last data filed at 10/11/2021 0532 Gross per 24 hour  Intake 120 ml  Output 760 ml  Net -640 ml      10/09/2021    4:50 PM 10/08/2021    4:56 PM 09/25/2021   10:38 AM  Last 3 Weights  Weight (lbs) 152 lb 1.9 oz 151 lb 14.4 oz 154 lb  Weight (kg) 69 kg 68.9 kg 69.854 kg      Telemetry    Sinus rhythm with rates in the 40s overnight but currently in the high 50s. - Personally Reviewed  ECG    No new EKG today. - Personally Reviewed  Physical Exam   GEN: No acute distress.   Neck: No JVD. Cardiac: Bradycardic  with normal rhythm. No murmurs, rubs, or gallops.  Respiratory: Clear to auscultation bilaterally. GI: Soft, non-distended, and non-tender. MS: No lower extremity edema. No deformity. Skin: Warm and dry. Neuro:  No focal deficits. Psych: Normal affect. Responds appropriately.   Labs    High Sensitivity Troponin:  No results for input(s): "TROPONINIHS" in the last 720 hours.   Chemistry Recent Labs  Lab 10/07/21 0622 10/07/21 1631 10/10/21 0115 10/11/21 0727  NA 135  --  137 135  K 3.8  --  4.0 4.3  CL 102  --  101 101  CO2 28  --  29 28  GLUCOSE 104*  --  102* 98  BUN 14  --  18 19  CREATININE 0.83  --  0.96 0.97  CALCIUM 8.8*  --  9.0 9.0  MG  --  1.7 1.7 1.9  GFRNONAA >60  --  >60 >60  ANIONGAP 5  --  7 6    Lipids No results for input(s): "CHOL", "TRIG", "HDL", "LABVLDL", "LDLCALC", "CHOLHDL" in the last 168 hours.  Hematology Recent Labs  Lab 10/07/21 0622 10/10/21 0115  WBC 6.5 6.0  RBC 3.79* 3.69*  HGB 12.0* 11.5*  HCT 35.2* 34.8*  MCV 92.9 94.3  MCH 31.7 31.2  MCHC 34.1 33.0  RDW 12.9 12.8  PLT 262 271   Thyroid  Recent Labs  Lab 10/07/21 1631  TSH 1.710    BNPNo results for input(s): "BNP", "PROBNP" in the last 168 hours.  DDimer No results for input(s): "DDIMER" in the last 168 hours.   Radiology    No results found.  Cardiac Studies   Echocardiogram 09/25/2021: Impressions:  1. Left ventricular ejection fraction, by estimation, is 65 to 70%. The  left ventricle has normal function. The left ventricle has no regional  wall motion abnormalities. There is mild asymmetric left ventricular  hypertrophy of the basal segment. Left  ventricular diastolic parameters were normal.   2. Right ventricular systolic function is normal. The right ventricular  size is normal. There is normal pulmonary artery systolic pressure. The  estimated right ventricular systolic pressure is 02.5 mmHg.   3. Left atrial size was mildly dilated.   4. The mitral valve  is grossly normal. Trivial mitral valve  regurgitation.   5. The aortic valve is tricuspid. Aortic valve regurgitation is not  visualized. Aortic valve mean gradient measures 2.0 mmHg.   6. Aortic dilatation noted. There is mild dilatation of the aortic root,  measuring 40 mm.   7. The inferior vena cava is normal in size with greater than 50%  respiratory variability, suggesting right atrial pressure of 3 mmHg.   Comparison(s): No prior Echocardiogram.   Patient Profile     76 y.o. 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 He was admitted on 09/25/2021 with CVA after presenting with dizziness and poor balance. He was discharged to CIR on 10/01/2021. Cardiology was consulted on 10/07/2021 for evaluation of tachycardia and patient was noted to be in SVT with rates in the 140s. He was transferred back to inpatient side on 10/09/2021 due to persistent SVT and need for telemetry.  Assessment & Plan    Paroxysmal SVT Patient has had multiple episodes during this admission - all self terminating. Rates as high as the 150s. TSH normal. Echo shows normal biventricular function and no significant valvular disease. - No recurrent SVT on Amiodarone. Still has sinus bradycardia with rates as low as the 40s overnight but currently in the high 50s while sitting in chair. - Potassium 4.3 today. - Magnesium 1.9 today.  - Continue PO Amiodarone '200mg'$  twice daily x2 weeks then decrease to 200 mg daily - Toprol-XL was decreased from '50mg'$  to '25mg'$  daily yesterday. Continue this dose.   Hypertension BP well controlled. - Continue Irbesartan 37.'5mg'$  daily. - Continue Toprol-XL '25mg'$  daily.   Recent CVA - Continue DAPT with Aspirin and Plavix. - Continue Crestor '20mg'$  daily. - Will plan for 30 day Event Monitor at discharge.  Disposition: planning to go back to [place] will sign off.   Medication Recommendations: Amiodarone 200 mg twice  daily x2 weeks then decrease to 200 mg daily.  Continue Toprol-XL 25 mg daily Other recommendations (labs, testing, etc): Will plan 30-day monitor on discharge from CIR, please let us know when being discharged Follow up as an outpatient: Will schedule   For questions or updates, please contact [place] Please consult [email or web address] for contact info under        Signed, Darreld Mclean, PA-C  10/11/2021, 8:56 AM    Patient seen and examined.  Agree with above documentation.  On exam, patient is alert and oriented, regular rate and rhythm, no murmurs, lungs CTAB, no LE edema or JVD.  No episodes of SVT since amiodarone started.  Would continue amiodarone 200 mg twice daily x2 weeks then decrease to 200 mg daily.  Continue Toprol-XL 25 mg daily.  Planning to go back to CIR.  We will plan 30-day monitor on discharge from CIR, please let us know when patient is being discharged.  Donato Heinz, MD

## 2021-10-11 NOTE — Discharge Summary (Incomplete)
Physician Discharge Summary   Patient: Javier Javier [name] MRN: [id number] DOB: May 07, 1945  Admit date:     10/09/2021  Discharge date: 10/12/21  Discharge Physician: Patrecia Pour   PCP: Pcp, No   Recommendations at discharge:  Continue home health therapies after CIR discharge. Follow up with cardiology for SVT, 30-day cardiac monitoring, discharged on metoprolol '25mg'$  daily and amiodarone '200mg'$  daily.   Discharge Diagnoses: Principal Problem:   SVT (supraventricular tachycardia) (HCC) Active Problems:   Essential hypertension   Hyperlipidemia   Cerebellar stroke ([place])   Malnutrition of moderate degree  Hospital Course: Javier Javier [name] is a 76 y.o. Javier [name] with a history of HTN, HLD, alcohol abuse and recent admission 5/24 - 5/30 for acute cerebellar CVA, subsequently discharged to CIR where he was noted to have SVT for which cardiology had been consulting. HR reached 160's during therapy, down to 140 with metoprolol, and he was transferred to inpatient hospital for continuous cardiac monitoring and amiodarone treatment. HR has reached into 40's, so beta blocker is lowered with improvement. No further episodes of SVT since transfer from CIR. He has been cleared for discharge by cardiology. Due to progression with rehab, PT/OT/SLP recommend return to CIR to continue rehabilitation.  Assessment and Plan: SVT:  - Resolved >72 hrs. Continue amiodarone '200mg'$  BID x2 weeks, then '200mg'$  daily.  - Please contact cardiology at time of discharge from rehab to assist with follow up scheduling and 30-day cardiac monitor arrangement.   Sinus bradycardia: Asymptomatic.  - Continue metoprolol at low dose.    Acute right cerebellar CVA with dysphagia: Has received inpatient rehabilitation services.  - PT, OT, SLP evaluated pt, recommend continuing inpatient rehab. Stable for discharge. - Continue DAPT x90 days, then aspirin alone, high-intensity statin.  - Outpatient stroke neurology follow up.     HTN:  - Continue ARB, BB   HLD:  - Continue rosuvastatin   Alcohol abuse:  - No evidence of withdrawal currently.    Moderate protein calorie malnutrition:  - Supplement protein and vitamins.   Consultants: Cardiology Procedures performed: None  Disposition:  CIR Diet recommendation:  Dysphagia type 3 thin Liquid DISCHARGE MEDICATION: Allergies as of 10/12/2021   No Known Allergies      Medication List     TAKE these medications    acetaminophen 325 MG tablet Commonly known as: TYLENOL Take 1-2 tablets (325-650 mg total) by mouth every 4 (four) hours as needed for mild pain.   amiodarone 200 MG tablet Commonly known as: PACERONE Take 1 tablet (200 mg total) by mouth 2 (two) times daily for 14 days, THEN 1 tablet (200 mg total) daily for 14 days. Start taking on: October 12, 2021   aspirin EC 81 MG tablet Take 1 tablet (81 mg total) by mouth daily. Swallow whole.   clopidogrel 75 MG tablet Commonly known as: Plavix Take 1 tablet (75 mg total) by mouth daily.   irbesartan 75 MG tablet Commonly known as: AVAPRO Take 0.5 tablets (37.5 mg total) by mouth daily.   methocarbamol 500 MG tablet Commonly known as: ROBAXIN Take 1 tablet (500 mg total) by mouth every 6 (six) hours as needed for muscle spasms.   metoprolol succinate 25 MG 24 hr tablet Commonly known as: TOPROL-XL Take 1 tablet (25 mg total) by mouth daily. Take with or immediately following a meal. What changed:  medication strength how much to take   pantoprazole 40 MG tablet Commonly known as: PROTONIX Take 1 tablet (40 mg total)  by mouth 2 (two) times daily.   rosuvastatin 20 MG tablet Commonly known as: CRESTOR Take 20 mg by mouth daily.        Discharge Exam: Filed Weights   10/09/21 1650  Weight: 69 kg  BP 128/70   Pulse 61   Temp (!) 97.5 F (36.4 C) (Oral)   Resp 16   Ht '5\' 11"'$  (1.803 m)   Wt 69 kg   SpO2 99%   BMI 21.22 kg/m   Chronically ill-appearing Javier [name] in no  distress Regular bradycardia, no edema Clear, nonlabored Alert, oriented though cognition is not 100%, he's interactive. Mild ataxia without focal sensory motor weakness.   Condition at discharge: stable  The results of significant diagnostics from this hospitalization (including imaging, microbiology, ancillary and laboratory) are listed below for reference.   Imaging Studies: DG Chest 2 View  Result Date: 10/08/2021 CLINICAL DATA:  Cough.  Episode of tachycardia yesterday. EXAM: CHEST - 2 VIEW COMPARISON:  None Available. FINDINGS: The cardiomediastinal silhouette is within normal limits. No airspace consolidation, edema, pleural effusion, or pneumothorax is identified. Multiple old left-sided rib fractures are noted. IMPRESSION: No active cardiopulmonary disease. Electronically Signed   By: Logan Bores M.D.   On: 10/08/2021 15:10   DG Swallowing Func-Speech Pathology  Result Date: 09/27/2021 Table formatting from the original result was not included. Objective Swallowing Evaluation: Type of Study: MBS-Modified Barium Swallow Study  Patient Details Name: Javier Javier [name] MRN: [id number] Date of Birth: 02-12-1946 Today's Date: 09/27/2021 Time: SLP Start Time (ACUTE ONLY): 1100 -SLP Stop Time (ACUTE ONLY): 1117 SLP Time Calculation (min) (ACUTE ONLY): 17 min Past Medical History: Past Medical History: Diagnosis Date  Cataract   HLD (hyperlipidemia)   Hypertension  Past Surgical History: Past Surgical History: Procedure Laterality Date  COLONOSCOPY  11/19/2009  RMR: 1. Multiple small rectal polyps status post snare polypectomy. 2. Multiple left colon polyp status post snare removal . Poor prep compromised exam.   COLONOSCOPY N/A 12/13/2014  Procedure: COLONOSCOPY;  Surgeon: Daneil Dolin, MD;  [facility];  Service: Endoscopy;  Laterality: N/A;  945  COLONOSCOPY N/A 07/18/2020  Procedure: COLONOSCOPY;  Surgeon: Daneil Dolin, MD;  [facility]: [facility];  Service: Endoscopy;  Laterality:  N/A;  am  FLEXIBLE SIGMOIDOSCOPY N/A 04/25/2020  Procedure: FLEXIBLE SIGMOIDOSCOPY;  Surgeon: Daneil Dolin, MD; incomplete colonoscopy with flex sig only due to poor prep.  HERNIA REPAIR    POLYPECTOMY  07/18/2020  Procedure: POLYPECTOMY;  Surgeon: Daneil Dolin, MD;  Location: [facility];  Service: Endoscopy;; HPI: This is a 76 year old Javier [name] who presents by EMS with concerns for dizziness.  Patient states that he woke up this morning and felt weak and dizzy.  He has been nauseated.  He denies room spinning dizziness.  Patient reports that he drinks daily.  When asked, she drank last night he cannot quantify.  Patient states that every time he gets up "I just have to fall back down."  EMS noted that he was able to ambulate to the stretcher.  He denies any abdominal pain but has had 1 episode of nonbilious, nonbloody emesis.  No recent illnesses or fevers. 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 Two small foci of acute infarction in the  right cerebellum. Patent vertebrobasilar arteries. 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 BSE completed on 09/26/21 initiating Dysphagia 3/thin w/ precautions; concerns for aspiration.  Subjective: "I"ve been coughing"  Recommendations for  follow up therapy are one component of a multi-disciplinary discharge planning process, led by the attending physician.  Recommendations may be updated based on patient status, additional functional criteria and insurance authorization. Assessment / Plan / Recommendation   09/27/2021  11:00 AM Clinical Impressions Clinical Impression Pt presents with mild oropharyngeal dysphagia characterized by impaired mastication (likely d/t edentulous state, pt does not have dentures present for study), delay in the initiaton of the swallow to valleculae and one incidence to the pyriform sinuses (with thin via tsp).  Decreased laryngeal elevation noted during study which primarily impacted thin via larger  volumes (successive swallows) via cup d/t decreased airway closure and trace aspiration which was cleared via throat clear or cough with cueing from SLP.  Smaller volumes of thin liquids did not elicit this response.  Good oral/pharyngeal clearance noted throughout study and no evidence of aspiration observed with any other consistency.  Pt with impulsive tendency, so larger volumes overall with liquids/foods place him at a higher aspiration risk  paired with impulsivity.  Recommend continuing Dysphagia 3/thin via small SUPERVISED amounts.  FULL supervision required during meal intake with precautions in place at all times. SLP Visit Diagnosis Dysphagia, oropharyngeal phase (R13.12) Impact on safety and function Mild aspiration risk;Moderate aspiration risk     09/27/2021  11:00 AM Treatment Recommendations Treatment Recommendations Therapy as outlined in treatment plan below     09/27/2021  11:00 AM Prognosis Prognosis for Safe Diet Advancement Good   09/27/2021  11:00 AM Diet Recommendations SLP Diet Recommendations Dysphagia 3 (Mech soft) solids;Thin liquid Liquid Administration via Cup;No straw Medication Administration Whole meds with puree Compensations Slow rate;Small sips/bites;Minimize environmental distractions;Clear throat intermittently;Multiple dry swallows after each bite/sip Postural Changes Seated upright at 90 degrees     09/27/2021  11:00 AM Other Recommendations Oral Care Recommendations Oral care BID;Staff/trained caregiver to provide oral care Follow Up Recommendations Follow physician's recommendations for discharge plan and follow up therapies Assistance recommended at discharge Frequent or constant Supervision/Assistance Functional Status Assessment Patient has had a recent decline in their functional status and demonstrates the ability to make significant improvements in function in a reasonable and predictable amount of time.   09/27/2021  11:00 AM Frequency and Duration  Speech Therapy  Frequency (ACUTE ONLY) min 2x/week Treatment Duration 1 week     09/27/2021  11:00 AM Oral Phase Oral Phase [facility]    09/27/2021  11:00 AM Pharyngeal Phase Pharyngeal Phase Impaired Pharyngeal- Nectar Teaspoon NT Pharyngeal Material does not enter airway Pharyngeal- Nectar Cup Delayed swallow initiation-vallecula Pharyngeal Material does not enter airway Pharyngeal- Nectar Straw NT Pharyngeal- Thin Teaspoon Delayed swallow initiation-pyriform sinuses;Reduced laryngeal elevation Pharyngeal Material does not enter airway Pharyngeal- Thin Cup Reduced laryngeal elevation;Reduced airway/laryngeal closure;Delayed swallow initiation-vallecula Pharyngeal Material enters airway, remains ABOVE vocal cords then ejected out Pharyngeal- Thin Straw NT Pharyngeal- Puree Delayed swallow initiation-vallecula Pharyngeal Material does not enter airway Pharyngeal- Mechanical Soft Delayed swallow initiation-vallecula Pharyngeal Material does not enter airway Pharyngeal- Multi-consistency NT Pharyngeal- Pill Delayed swallow initiation-vallecula Pharyngeal Material does not enter airway    09/27/2021  11:00 AM Cervical Esophageal Phase  Cervical Esophageal Phase [facility] Sidle, M.S., CCC-SLP 09/27/2021, 12:12 PM                     US Carotid Bilateral  Result Date: 09/26/2021 CLINICAL DATA:  Stroke. History of hypertension and hyperlipidemia. Former smoker. EXAM: BILATERAL CAROTID DUPLEX ULTRASOUND TECHNIQUE: Pearline Cables scale imaging, color Doppler and duplex ultrasound were performed of bilateral  carotid and vertebral arteries in the neck. COMPARISON:  Brain MRI-09/25/2021 FINDINGS: Criteria: Quantification of carotid stenosis is based on velocity parameters that correlate the residual internal carotid diameter with NASCET-based stenosis levels, using the diameter of the distal internal carotid lumen as the denominator for stenosis measurement. The following velocity measurements were obtained: RIGHT ICA: 69/19 cm/sec CCA: 657/8 cm/sec SYSTOLIC  ICA/CCA RATIO:  0.6 ECA: 67 cm/sec LEFT ICA: 95/24 cm/sec CCA: 46/9 cm/sec SYSTOLIC ICA/CCA RATIO:  1.5 ECA: 64 cm/sec RIGHT CAROTID ARTERY: There is a minimal amount of eccentric echogenic plaque scattered throughout the right common carotid artery. There is a minimal amount of eccentric echogenic plaque within the right carotid bulb, extending to involve the origin and proximal aspects of the right internal carotid artery (image 24), not resulting in elevated peak systolic velocities within the interrogated course of the right internal carotid artery to suggest a hemodynamically significant stenosis. RIGHT VERTEBRAL ARTERY:  Not visualized LEFT CAROTID ARTERY: There is a minimal amount of atherosclerotic plaque scattered throughout the left common carotid artery. There is a minimal amount of eccentric echogenic plaque involving the origin and proximal aspects of the left internal carotid artery (image 58), not resulting in elevated peak systolic velocities within the interrogated course of the left internal carotid artery to suggest a hemodynamically significant stenosis. LEFT VERTEBRAL ARTERY:  Antegrade Flow IMPRESSION: 1. Minimal amount of bilateral atherosclerotic plaque, right subjectively greater than left, not resulting in a hemodynamically significant stenosis within either internal carotid artery. 2. Nonvisualization of the right vertebral artery, an age-indeterminate finding in the absence of prior examinations. 3. Patent left vertebral artery. Electronically Signed   By: Sandi Mariscal M.D.   On: 09/26/2021 08:55   ECHOCARDIOGRAM COMPLETE  Result Date: 09/25/2021    ECHOCARDIOGRAM REPORT   Patient Name:   Javier Javier [name] Date of Exam: 09/25/2021 Medical Rec #:  [id number]           Height:       71.0 in Accession #:    [id number]          Weight:       154.0 lb Date of Birth:  02-27-1946            BSA:          1.887 m Patient Age:    50 years            BP:           152/76 mmHg Patient Gender: M                    HR:           63 bpm. Exam Location:  [facility] Procedure: 2D Echo, Cardiac Doppler and Color Doppler Indications:   Stroke  History:       Patient has no prior history of Echocardiogram examinations.                Stroke.  Sonographer:   Wenda Low Referring      [place] Phys:  Sonographer Comments: Image acquisition challenging due to respiratory motion. IMPRESSIONS  1. Left ventricular ejection fraction, by estimation, is 65 to 70%. The left ventricle has normal function. The left ventricle has no regional wall motion abnormalities. There is mild asymmetric left ventricular hypertrophy of the basal segment. Left ventricular diastolic parameters were normal.  2. Right ventricular systolic function is normal. The right ventricular size is normal. There is normal pulmonary artery  systolic pressure. The estimated right ventricular systolic pressure is 65.7 mmHg.  3. Left atrial size was mildly dilated.  4. The mitral valve is grossly normal. Trivial mitral valve regurgitation.  5. The aortic valve is tricuspid. Aortic valve regurgitation is not visualized. Aortic valve mean gradient measures 2.0 mmHg.  6. Aortic dilatation noted. There is mild dilatation of the aortic root, measuring 40 mm.  7. The inferior vena cava is normal in size with greater than 50% respiratory variability, suggesting right atrial pressure of 3 mmHg. Comparison(s): No prior Echocardiogram. FINDINGS  Left Ventricle: Left ventricular ejection fraction, by estimation, is 65 to 70%. The left ventricle has normal function. The left ventricle has no regional wall motion abnormalities. The left ventricular internal cavity size was normal in size. There is  mild asymmetric left ventricular hypertrophy of the basal segment. Left ventricular diastolic parameters were normal. Right Ventricle: The right ventricular size is normal. No increase in right ventricular wall thickness. Right ventricular systolic function is  normal. There is normal pulmonary artery systolic pressure. The tricuspid regurgitant velocity is 2.35 m/s, and  with an assumed right atrial pressure of 3 mmHg, the estimated right ventricular systolic pressure is 84.6 mmHg. Left Atrium: Left atrial size was mildly dilated. Right Atrium: Right atrial size was normal in size. Pericardium: There is no evidence of pericardial effusion. Mitral Valve: The mitral valve is grossly normal. Trivial mitral valve regurgitation. MV peak gradient, 3.2 mmHg. The mean mitral valve gradient is 1.0 mmHg. Tricuspid Valve: The tricuspid valve is grossly normal. Tricuspid valve regurgitation is mild. Aortic Valve: The aortic valve is tricuspid. There is mild aortic valve annular calcification. Aortic valve regurgitation is not visualized. Aortic valve mean gradient measures 2.0 mmHg. Aortic valve peak gradient measures 3.9 mmHg. Aortic valve area, by  VTI measures 4.51 cm. Pulmonic Valve: The pulmonic valve was grossly normal. Pulmonic valve regurgitation is trivial. Aorta: Aortic dilatation noted. There is mild dilatation of the aortic root, measuring 40 mm. Venous: The inferior vena cava is normal in size with greater than 50% respiratory variability, suggesting right atrial pressure of 3 mmHg. IAS/Shunts: No atrial level shunt detected by color flow Doppler.  LEFT VENTRICLE PLAX 2D LVIDd:         4.30 cm      Diastology LVIDs:         2.10 cm      LV e' medial:    9.14 cm/s LV PW:         1.10 cm      LV E/e' medial:  8.2 LV IVS:        1.20 cm      LV e' lateral:   9.36 cm/s LVOT diam:     2.50 cm      LV E/e' lateral: 8.0 LV SV:         112 LV SV Index:   60 LVOT Area:     4.91 cm  LV Volumes (MOD) LV vol d, MOD A2C: 101.0 ml LV vol d, MOD A4C: 82.8 ml LV vol s, MOD A2C: 29.7 ml LV vol s, MOD A4C: 27.7 ml LV SV MOD A2C:     71.3 ml LV SV MOD A4C:     82.8 ml LV SV MOD BP:      63.0 ml RIGHT VENTRICLE RV Basal diam:  3.75 cm RV Mid diam:    3.10 cm RV S prime:     10.30 cm/s  TAPSE (M-mode): 2.8 cm LEFT  ATRIUM             Index        RIGHT ATRIUM           Index LA diam:        3.60 cm 1.91 cm/m   RA Area:     19.00 cm LA Vol (A2C):   77.6 ml 41.13 ml/m  RA Volume:   57.20 ml  30.31 ml/m LA Vol (A4C):   65.0 ml 34.45 ml/m LA Biplane Vol: 72.7 ml 38.53 ml/m  AORTIC VALVE                    PULMONIC VALVE AV Area (Vmax):    4.33 cm     PV Vmax:       0.66 m/s AV Area (Vmean):   4.50 cm     PV Peak grad:  1.8 mmHg AV Area (VTI):     4.51 cm AV Vmax:           99.20 cm/s AV Vmean:          65.400 cm/s AV VTI:            0.249 m AV Peak Grad:      3.9 mmHg AV Mean Grad:      2.0 mmHg LVOT Vmax:         87.50 cm/s LVOT Vmean:        60.000 cm/s LVOT VTI:          0.229 m LVOT/AV VTI ratio: 0.92  AORTA Ao Root diam: 4.00 cm Ao Asc diam:  3.60 cm MITRAL VALVE               TRICUSPID VALVE MV Area (PHT): 3.50 cm    TR Peak grad:   22.1 mmHg MV Area VTI:   3.29 cm    TR Vmax:        235.00 cm/s MV Peak grad:  3.2 mmHg MV Mean grad:  1.0 mmHg    SHUNTS MV Vmax:       0.89 m/s    Systemic VTI:  0.23 m MV Vmean:      41.8 cm/s   Systemic Diam: 2.50 cm MV Decel Time: 217 msec MV E velocity: 75.00 cm/s MV A velocity: 66.80 cm/s MV E/A ratio:  1.12 Rozann Lesches MD Electronically signed by Rozann Lesches MD Signature Date/Time: 09/25/2021/2:30:39 PM    Final    MR BRAIN WO CONTRAST  Result Date: 09/25/2021 CLINICAL DATA:  Dizziness, persistent/recurrent, cardiac or vascular cause suspected EXAM: MRI HEAD WITHOUT CONTRAST MRA HEAD WITHOUT CONTRAST TECHNIQUE: Multiplanar, multi-echo pulse sequences of the brain and surrounding structures were acquired without intravenous contrast. Angiographic images of the Circle of Willis were acquired using MRA technique without intravenous contrast. COMPARISON:  CT from earlier same day FINDINGS: MRI HEAD Patient could not tolerate all sequences. Brain: There is mildly reduced diffusion in the superior left cerebellum involving the hemisphere and  vermis. There is extension into the dorsal left midbrain with involvement of the superior cerebellar peduncle. Additional small area in the inferior left cerebellum. Two small foci of reduced diffusion in the right cerebellum. No evidence of hemorrhage. Additional chronic bilateral cerebellar and left occipital infarcts. Patchy foci of T2 hyperintensity in the supratentorial white matter are nonspecific but probably reflect chronic microvascular ischemic changes. Prominence of the ventricles and sulci reflects parenchymal volume loss. There is no intracranial mass or mass effect. No extra-axial collection. No hydrocephalus.  Vascular: Major vessel flow voids at the skull base are preserved. Skull and upper cervical spine: Normal marrow signal is preserved. Sinuses/Orbits: Mild patchy mucosal thickening. Bilateral lens replacements. Other: Sella is unremarkable.  Mastoid air cells are clear. MRA HEAD Motion artifact is present. Intracranial internal carotid arteries are patent with atherosclerotic irregularity. Middle and anterior cerebral arteries are patent. Intracranial vertebral arteries are patent with atherosclerotic irregularity. Patent PICA origins, more irregular on the left. Likely patent AICA origins. Right SCA origin is patent. The left SCA origin is not identified. Bilateral posterior communicating arteries are present. Posterior cerebral arteries are patent. Bilateral posterior communicating arteries are present. IMPRESSION: Acute infarcts involving the left cerebellum extending into the dorsal left midbrain. Two small foci of acute infarction in the right cerebellum. Patent vertebrobasilar arteries. Left SCA may be occluded. 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 microvascular ischemic changes. Electronically Signed   By: Macy Mis M.D.   On: 09/25/2021 08:03   MR ANGIO HEAD WO CONTRAST  Result Date: 09/25/2021 CLINICAL DATA:  Dizziness, persistent/recurrent, cardiac  or vascular cause suspected EXAM: MRI HEAD WITHOUT CONTRAST MRA HEAD WITHOUT CONTRAST TECHNIQUE: Multiplanar, multi-echo pulse sequences of the brain and surrounding structures were acquired without intravenous contrast. Angiographic images of the Circle of Willis were acquired using MRA technique without intravenous contrast. COMPARISON:  CT from earlier same day FINDINGS: MRI HEAD Patient could not tolerate all sequences. Brain: There is mildly reduced diffusion in the superior left cerebellum involving the hemisphere and vermis. There is extension into the dorsal left midbrain with involvement of the superior cerebellar peduncle. Additional small area in the inferior left cerebellum. Two small foci of reduced diffusion in the right cerebellum. No evidence of hemorrhage. Additional chronic bilateral cerebellar and left occipital infarcts. Patchy foci of T2 hyperintensity in the supratentorial white matter are nonspecific but probably reflect chronic microvascular ischemic changes. Prominence of the ventricles and sulci reflects parenchymal volume loss. There is no intracranial mass or mass effect. No extra-axial collection. No hydrocephalus. Vascular: Major vessel flow voids at the skull base are preserved. Skull and upper cervical spine: Normal marrow signal is preserved. Sinuses/Orbits: Mild patchy mucosal thickening. Bilateral lens replacements. Other: Sella is unremarkable.  Mastoid air cells are clear. MRA HEAD Motion artifact is present. Intracranial internal carotid arteries are patent with atherosclerotic irregularity. Middle and anterior cerebral arteries are patent. Intracranial vertebral arteries are patent with atherosclerotic irregularity. Patent PICA origins, more irregular on the left. Likely patent AICA origins. Right SCA origin is patent. The left SCA origin is not identified. Bilateral posterior communicating arteries are present. Posterior cerebral arteries are patent. Bilateral posterior  communicating arteries are present. IMPRESSION: Acute infarcts involving the left cerebellum extending into the dorsal left midbrain. Two small foci of acute infarction in the right cerebellum. Patent vertebrobasilar arteries. Left SCA may be occluded. 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 microvascular ischemic changes. Electronically Signed   By: Macy Mis M.D.   On: 09/25/2021 08:03   CT Head Wo Contrast  Result Date: 09/25/2021 CLINICAL DATA:  Dizziness, persistent/recurrent with cardiac or vascular cause suspected. EXAM: CT HEAD WITHOUT CONTRAST TECHNIQUE: Contiguous axial images were obtained from the base of the skull through the vertex without intravenous contrast. RADIATION DOSE REDUCTION: This exam was performed according to the departmental dose-optimization program which includes automated exposure control, adjustment of the mA and/or kV according to patient size and/or use of iterative reconstruction technique. COMPARISON:  None Available. FINDINGS: Brain: No evidence  of acute infarction, hemorrhage, hydrocephalus, extra-axial collection or mass lesion/mass effect. Well-defined/chronic appearing infarcts in the left more than right cerebellum and left occipital cortex. Vascular: No hyperdense vessel or unexpected calcification. Skull: Normal. Negative for fracture or focal lesion. Sinuses/Orbits: No acute finding. IMPRESSION: 1. No acute finding. 2. 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 Electronically Signed   By: Jorje Guild M.D.   On: 09/25/2021 04:34    Microbiology: Results for orders placed or performed during the hospital encounter of 07/16/20  SARS CORONAVIRUS 2 (TAT 6-24 HRS) Nasopharyngeal Nasopharyngeal Swab     Status: None   Collection Time: 07/16/20  9:02 AM   Specimen: Nasopharyngeal Swab  Result Value Ref Range Status   SARS Coronavirus 2 NEGATIVE NEGATIVE Final    Comment: (NOTE) SARS-CoV-2  target nucleic acids are NOT DETECTED.  The SARS-CoV-2 RNA is generally detectable in upper and lower respiratory specimens during the acute phase of infection. Negative results do not preclude SARS-CoV-2 infection, do not rule out co-infections with other pathogens, and should not be used as the sole basis for treatment or other patient management decisions. Negative results must be combined with clinical observations, patient history, and epidemiological information. The expected result is Negative.  Fact Sheet for Patients: SugarRoll.be  Fact Sheet for Healthcare Providers: https://www.woods-mathews.com/  This test is not yet approved or cleared by the [place] FDA and  has been authorized for detection and/or diagnosis of SARS-CoV-2 by FDA under an Emergency Use Authorization (EUA). This EUA will remain  in effect (meaning this test can be used) for the duration of the COVID-19 declaration under Se ction 564(b)(1) of the Act, 21 U.S.C. section 360bbb-3(b)(1), unless the authorization is terminated or revoked sooner.  Performed at [facility], [place] [address], [place], [place] [address]     Labs: CBC: Recent Labs  Lab 10/07/21 0622 10/10/21 0115  WBC 6.5 6.0  HGB 12.0* 11.5*  HCT 35.2* 34.8*  MCV 92.9 94.3  PLT 262 888   Basic Metabolic Panel: Recent Labs  Lab 10/07/21 0622 10/07/21 1631 10/10/21 0115 10/11/21 0727  NA 135  --  137 135  K 3.8  --  4.0 4.3  CL 102  --  101 101  CO2 28  --  29 28  GLUCOSE 104*  --  102* 98  BUN 14  --  18 19  CREATININE 0.83  --  0.96 0.97  CALCIUM 8.8*  --  9.0 9.0  MG  --  1.7 1.7 1.9   Liver Function Tests: No results for input(s): "AST", "ALT", "ALKPHOS", "BILITOT", "PROT", "ALBUMIN" in the last 168 hours. CBG: Recent Labs  Lab 10/11/21 0740 10/11/21 1211 10/11/21 1655 10/12/21 0750 10/12/21 1133  GLUCAP 98 98 101* 149* 97    Discharge time spent: greater  than 30 minutes.  Signed: Patrecia Pour, MD [facility] 10/12/2021

## 2021-10-11 NOTE — Evaluation (Signed)
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) 09/25/2021   Essential hypertension 09/25/2021   Hyperlipidemia 09/25/2021   Gastroesophageal reflux disease 09/25/2021   Diverticulosis of colon without hemorrhage    History of adenomatous polyp of colon 11/16/2014   Past Medical History:  Past Medical History:  Diagnosis Date   Cataract    HLD (hyperlipidemia)    Hypertension    Past Surgical History:  Past Surgical History:  Procedure Laterality Date   COLONOSCOPY  11/19/2009   RMR: 1. Multiple small rectal polyps status post snare polypectomy. 2. Multiple left colon polyp status post snare removal . Poor prep compromised exam.    COLONOSCOPY N/A 12/13/2014   Procedure: COLONOSCOPY;  Surgeon: Daneil Dolin, MD;  [facility];  Service: Endoscopy;  Laterality: N/A;  945   COLONOSCOPY N/A 07/18/2020   Procedure: COLONOSCOPY;  Surgeon: Daneil Dolin, MD;  Location: [facility];  Service: Endoscopy;  Laterality: N/A;  am   FLEXIBLE SIGMOIDOSCOPY N/A 04/25/2020   Procedure: FLEXIBLE SIGMOIDOSCOPY;  Surgeon: Daneil Dolin, MD; incomplete colonoscopy with flex sig only due to poor prep.   HERNIA REPAIR     POLYPECTOMY  07/18/2020   Procedure: POLYPECTOMY;  Surgeon: Daneil Dolin, MD;  Location: [facility];  Service: Endoscopy;;   HPI:  76 yo male admitted from [facility] 6/7 with SVT. 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 MBS on 5/26 showed trace aspiration of thin liquids with large sips. Initially pt started on thin/dys 3, but due to coughing after meals and impulsivity pt downgraded to nectar  thick liquids. Advancement complicated on CIR by intermittent coughing at basline and following meals. Subjectively pt appeared to be tolerating nectar diet with soft solids. CXR on readmission negative for acute finding. PMHx: GERD, diverticulosis, HLD, HTN   Assessment / Plan / Recommendation Clinical Impression  Pt seen for assessment of cognition with basic functional tasks. Pts intellectual awareness of deficits is simplistic but accurate. He is unable to predict his abilities at home or participate in anticipatory problem solving. He does state he used to be independent but isnt sure about now. WHen observed with functional tasks requiring working memory, pt is 100% unsuccessful unless there is is immediate verbal cues - such as dialing a phone number or making food choices. He is inattentive to left visual field. Ptis not impulsive and responds to assist well. If he had full supervision from a capable caregiver he could function in a home setting. He should not be responsible for taking his medications. Tried to call wife but couldnot reach her. Pt would benefit from ongoing therapy in AIR setting, but could function at home with aforementioned support.    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  Recommendations may be updated based on patient status, additional functional criteria and insurance authorization.    Follow Up Recommendations       Assistance Recommended at Discharge     Functional  Status Assessment    Frequency and Duration min 2x/week  2 weeks      SLP Evaluation Cognition  Overall Cognitive Status: Impaired/Different from baseline Arousal/Alertness: Awake/alert Orientation Level: Oriented  X4 Sustained Attention: Appears intact Sustained Attention Impairment: Verbal basic;Functional basic Memory: Impaired Memory Impairment:  (working memory with numbers) Awareness: Impaired Awareness Impairment: Anticipatory impairment;Emergent impairment Problem Solving: Impaired Problem Solving Impairment: Functional basic;Functional complex;Verbal basic Executive Function: Self Monitoring       Comprehension  Auditory Comprehension Overall Auditory Comprehension: Appears within functional limits for tasks assessed    Expression Verbal Expression Overall Verbal Expression: Appears within functional limits for tasks assessed   Oral / Motor  Motor Speech Overall Motor Speech: Appears within functional limits for tasks assessed            Fermina Mishkin, [name] 10/11/2021, 9:35 AM

## 2021-10-11 NOTE — Plan of Care (Signed)
  Problem: Education: Goal: Knowledge of General Education information will improve Description: Including pain rating scale, medication(s)/side effects and non-pharmacologic comfort measures Outcome: Progressing   Problem: Health Behavior/Discharge Planning: Goal: Ability to manage health-related needs will improve Outcome: Progressing   Problem: Clinical Measurements: Goal: Respiratory complications will improve Outcome: Progressing   Problem: Clinical Measurements: Goal: Cardiovascular complication will be avoided Outcome: Progressing   Problem: Activity: Goal: Risk for activity intolerance will decrease Outcome: Progressing   Problem: Safety: Goal: Ability to remain free from injury will improve Outcome: Progressing   

## 2021-10-12 DIAGNOSIS — I639 Cerebral infarction, unspecified: Secondary | ICD-10-CM | POA: Diagnosis not present

## 2021-10-12 DIAGNOSIS — E785 Hyperlipidemia, unspecified: Secondary | ICD-10-CM | POA: Diagnosis not present

## 2021-10-12 DIAGNOSIS — I10 Essential (primary) hypertension: Secondary | ICD-10-CM | POA: Diagnosis not present

## 2021-10-12 DIAGNOSIS — I471 Supraventricular tachycardia: Secondary | ICD-10-CM | POA: Diagnosis not present

## 2021-10-12 LAB — GLUCOSE, CAPILLARY
Glucose-Capillary: 149 mg/dL — ABNORMAL HIGH (ref 70–99)
Glucose-Capillary: 97 mg/dL (ref 70–99)
Glucose-Capillary: 106 mg/dL — ABNORMAL HIGH (ref 70–99)
Glucose-Capillary: 136 mg/dL — ABNORMAL HIGH (ref 70–99)

## 2021-10-12 MED ORDER — METOPROLOL SUCCINATE ER 25 MG PO TB24: 25.0000 mg | ORAL_TABLET | Freq: Every day | ORAL | Status: AC

## 2021-10-12 MED ORDER — AMIODARONE HCL 200 MG PO TABS: ORAL_TABLET | ORAL | Status: DC

## 2021-10-12 NOTE — TOC Progression Note (Signed)
Transition of Care [facility]) - Progression Note    Patient Details  Name: Javier [name] MRN: [id number] Date of Birth: August 19, 1945  Transition of Care [facility]) CM/SW Contact  Tiajuana Amass [place], [place] Phone Number: [phone] 10/12/2021, 1:57 PM  Clinical Narrative:  [facility] team received call from [place] inquiring about discharge status to CIR. Followed up with weekend CIR coordinator- Clemens Catholic that shares insurance is still pending. Patient will most likely remain IP throughout the weekend. Will continue to follow for discharge needs and insurance authorization.        Barriers to Discharge: Ship broker, Continued Medical Work up [facility]auf[facility] is pending.)  Expected Discharge Plan and Services           Expected Discharge Date: 10/12/21                                     Social Determinants of Health (SDOH) Interventions    Readmission Risk Interventions     No data to display

## 2021-10-13 DIAGNOSIS — I639 Cerebral infarction, unspecified: Secondary | ICD-10-CM | POA: Diagnosis not present

## 2021-10-13 DIAGNOSIS — E785 Hyperlipidemia, unspecified: Secondary | ICD-10-CM | POA: Diagnosis not present

## 2021-10-13 DIAGNOSIS — I10 Essential (primary) hypertension: Secondary | ICD-10-CM | POA: Diagnosis not present

## 2021-10-13 DIAGNOSIS — I471 Supraventricular tachycardia: Secondary | ICD-10-CM | POA: Diagnosis not present

## 2021-10-13 LAB — GLUCOSE, CAPILLARY
Glucose-Capillary: 98 mg/dL (ref 70–99)
Glucose-Capillary: 118 mg/dL — ABNORMAL HIGH (ref 70–99)

## 2021-10-13 NOTE — Progress Notes (Signed)
Mr. Medina remains stable today. This morning he has no complaints, just eager to get back to rehab so that he can eventually return home. No chest pain, dyspnea, palpitations.   Telemetry personally reviewed with sinus rhythm, 1st degree AV block and rates in 50-60's overnight, no sustained rate in 40's. No SVT.   He was discharged yesterday to CIR, though insurance has required reauthorization to return to CIR from whence he came. This remains pending, though he remains stable for discharge on his present medications once authorization is obtained and bed is available. While he remains here, we will continue cardiac telemetry.   Vance Gather, MD 10/13/2021 10:11 AM

## 2021-10-13 NOTE — PMR Pre-admission (Shared)
PMR Admission Coordinator Pre-Admission Assessment  Patient: Javier [name] is an 76 y.o., male MRN: [id number] DOB: 01/29/1946 Height: 5' 11" (180.3 cm) Weight: 69 kg  IInsurance Information HMO:     PPO:      PCP:      IPA:      80/20:      OTHER:  PRIMARY: [employer] Medicare       Policy#: [id number]      Subscriber: pt CM Name:       Phone#: [phone]     Fax#: [phone]  Pre-Cert#: Benefits:  Phone #:      Name:  Eff Date: 09/02/2021 - still active Deductible: does not have one OOP Max: $8,300 ($0 met) CIR: $1,556/ admission copay SNF: $0.00 Copayment per day for days 1-20; $200 Copayment per day for days 21-100; Maximum of 100 days/benefit period Outpatient: 80% coverage; 20% co-insurance Home Health:  100% coverage, limited by medical necessity DME: 80% coverage; 20% co-insurance Providers: in network   Secondary: Medicaid of [place]  [id number]  Financial Counselor:       Phone#:   The "Data Collection Information Summary" for patients in Inpatient Rehabilitation Facilities with attached "Privacy Act Statement-Health Care Records" was provided and verbally reviewed with: Patient and Family  Emergency Contact Information Contact Information     Name Relation Home Work Mobile   Balbach,Viola Spouse [phone]  [phone]       Current Medical History  Patient Admitting Diagnosis: CVA History of Present Illness: Javier [name] is a 75 y.o. male with medical history significant of hypertension, hyperlipidemia and gastroesophageal reflux disease who presented to the [facility] secondary to dizziness and poor balance on 09/25/21. Work-up in the ED has demonstrated no acute hemorrhagic changes on CT scan; positive MRI for cerebellar infarcts Pt. Was seen by PT/OT/SLP during admission and they recommended CIR to assist return to PLOF  Pt. Was admitted to CIR  10/01/21-10/09/21.He was transferred off the unit when he was noted to have SVT for which cardiology had been  consulting. HR reached 160's during therapy, down to 140 with metoprolol, and he was transferred to inpatient hospital for continuous cardiac monitoring and amiodarone treatment. HR has reached into 40's, so beta blocker is lowered with improvement. No further episodes of SVT since transfer from CIR. He has been cleared for discharge back to CIR  by cardiology.   Complete NIHSS TOTAL: 0  Patient's medical record from [facility] has been reviewed by the rehabilitation admission coordinator and physician.  Past Medical History  Past Medical History:  Diagnosis Date   Cataract    HLD (hyperlipidemia)    Hypertension     Has the patient had major surgery during 100 days prior to admission? No  Family History   family history includes Heart attack in his father; Stroke in his mother.  Current Medications  Current Facility-Administered Medications:    acetaminophen (TYLENOL) tablet 650 mg, 650 mg, Oral, Q6H PRN **OR** acetaminophen (TYLENOL) suppository 650 mg, 650 mg, Rectal, Q6H PRN, Yates, Jennifer, MD   amiodarone (PACERONE) tablet 200 mg, 200 mg, Oral, BID, Yates, Jennifer, MD, 200 mg at 10/13/21 0930   aspirin EC tablet 81 mg, 81 mg, Oral, Daily, Yates, Jennifer, MD, 81 mg at 10/13/21 0930   bisacodyl (DULCOLAX) EC tablet 5 mg, 5 mg, Oral, Daily PRN, Yates, Jennifer, MD   clopidogrel (PLAVIX) tablet 75 mg, 75 mg, Oral, Daily, Yates, Jennifer, MD, 75 mg at 10/13/21 0929     docusate sodium (COLACE) capsule 100 mg, 100 mg, Oral, BID, Yates, Jennifer, MD, 100 mg at 10/11/21 2252   enoxaparin (LOVENOX) injection 40 mg, 40 mg, Subcutaneous, Q24H, Yates, Jennifer, MD, 40 mg at 10/12/21 2112   hydrALAZINE (APRESOLINE) injection 5 mg, 5 mg, Intravenous, Q4H PRN, Yates, Jennifer, MD   irbesartan (AVAPRO) tablet 37.5 mg, 37.5 mg, Oral, Daily, Yates, Jennifer, MD, 37.5 mg at 10/13/21 0929   methocarbamol (ROBAXIN) tablet 500 mg, 500 mg, Oral, Q6H PRN, Yates, Jennifer, MD    metoprolol succinate (TOPROL-XL) 24 hr tablet 25 mg, 25 mg, Oral, Daily, Schumann, Christopher L, MD, 25 mg at 10/13/21 0929   morphine (PF) 2 MG/ML injection 2 mg, 2 mg, Intravenous, Q2H PRN, Yates, Jennifer, MD   multivitamin with minerals tablet 1 tablet, 1 tablet, Oral, Daily, Grunz, Ryan B, MD, 1 tablet at 10/13/21 0930   ondansetron (ZOFRAN) tablet 4 mg, 4 mg, Oral, Q6H PRN **OR** ondansetron (ZOFRAN) injection 4 mg, 4 mg, Intravenous, Q6H PRN, Yates, Jennifer, MD   oxyCODONE (Oxy IR/ROXICODONE) immediate release tablet 5 mg, 5 mg, Oral, Q4H PRN, Yates, Jennifer, MD   pantoprazole (PROTONIX) EC tablet 40 mg, 40 mg, Oral, BID, Yates, Jennifer, MD, 40 mg at 10/13/21 0929   polyethylene glycol (MIRALAX / GLYCOLAX) packet 17 g, 17 g, Oral, Daily PRN, Yates, Jennifer, MD   rosuvastatin (CRESTOR) tablet 20 mg, 20 mg, Oral, Daily, Yates, Jennifer, MD, 20 mg at 10/13/21 0929   sodium chloride flush (NS) 0.9 % injection 3 mL, 3 mL, Intravenous, Q12H, Yates, Jennifer, MD, 3 mL at 10/13/21 0930  Patients Current Diet:  Diet Order             DIET DYS 3 Room service appropriate? Yes; Fluid consistency: Thin  Diet effective now                   Precautions / Restrictions Precautions Precautions: Fall, Other (comment) Precaution Comments: HOH, dysphagia diet, L hemibody ataxia Restrictions Weight Bearing Restrictions: No   Has the patient had 2 or more falls or a fall with injury in the past year? {Yes/No/Unknown:304600602} Prior Activity Level Limited Community (1-2x/wk): Pt. went out for appointments   Prior Functional Level Self Care: Did the patient need help bathing, dressing, using the toilet or eating? Independent   Indoor Mobility: Did the patient need assistance with walking from room to room (with or without device)? Independent   Stairs: Did the patient need assistance with internal or external stairs (with or without device)? Independent   Functional Cognition: Did the  patient need help planning regular tasks such as shopping or remembering to take medications? Independent   Patient Information Are you of Hispanic, Latino/a,or Spanish origin?: A. No, not of Hispanic, Latino/a, or Spanish origin What is your race?: B. Black or African American Do you need or want an interpreter to communicate with a doctor or health care staff?: 0. No   Patient's Response To:  Health Literacy and Transportation Is the patient able to respond to health literacy and transportation needs?: Yes Health Literacy - How often do you need to have someone help you when you read instructions, pamphlets, or other written material from your doctor or pharmacy?: Never In the past 12 months, has lack of transportation kept you from medical appointments or from getting medications?: Yes In the past 12 months, has lack of transportation kept you from meetings, work, or from getting things needed for daily living?: No   Home Assistive Devices /   Equipment Home Assistive Devices/Equipment: Dentures (specify type) (At home) Home Equipment: Rolling Walker (2 wheels)   Prior Device Use: Indicate devices/aids used by the patient prior to current illness, exacerbation or injury?  cane   Current Functional Level Cognition   Arousal/Alertness: Awake/alert Overall Cognitive Status: Impaired/Different from baseline Orientation Level: Oriented to person, Oriented to place, Disoriented to time, Disoriented to situation Attention: Sustained Sustained Attention: Impaired Sustained Attention Impairment: Verbal basic, Functional basic Memory: Impaired Memory Impairment: Retrieval deficit, Decreased short term memory, Decreased recall of new information Decreased Short Term Memory: Verbal basic, Functional basic Awareness: Impaired Awareness Impairment: Anticipatory impairment Problem Solving: Impaired Problem Solving Impairment: Verbal basic, Functional basic Behaviors: Restless, Impulsive,  Perseveration Safety/Judgment: Impaired    Extremity Assessment (includes Sensation/Coordination)   Upper Extremity Assessment: LUE deficits/detail, RUE deficits/detail RUE Deficits / Details: Ataxic with coordination deficits but 4+/5 grossly. RUE Sensation: WNL RUE Coordination: decreased gross motor, decreased fine motor LUE Deficits / Details: 3-/5 MMT shoulder flexion; 4 to 4+ grossly for elbow flexion, extension, wrist extension, and grip. LUE Sensation: decreased proprioception LUE Coordination: decreased fine motor, decreased gross motor  Lower Extremity Assessment: Defer to PT evaluation     ADLs   Overall ADL's : Needs assistance/impaired Eating/Feeding: Set up, Sitting, Minimal assistance Eating/Feeding Details (indicate cue type and reason): Assist to open milk, fruit cup, and apple juice. Spilling coffee on self and tray due to ataxic movement. Grooming: Minimal assistance, Sitting Grooming Details (indicate cue type and reason): washed hands with min assist while standing at the sink, washed face with set up sitting in chair. Upper Body Bathing: Minimal assistance, Sitting Lower Body Bathing: Min guard, Minimal assistance, Sitting/lateral leans Upper Body Dressing : Supervision/safety, Min guard, Sitting Lower Body Dressing: Supervision/safety, Min guard, Sitting/lateral leans Lower Body Dressing Details (indicate cue type and reason): able to doff and done L sock seated at EOB with labored effort and extended time Toilet Transfer: Moderate assistance, Ambulation Toilet Transfer Details (indicate cue type and reason): Completed toilet t/f with RW and mod assist Toileting- Clothing Manipulation and Hygiene: Moderate assistance Toileting - Clothing Manipulation Details (indicate cue type and reason): required mod assist for posterior toilet hygiene Tub/ Shower Transfer: Minimal assistance, Tub bench, Stand-pivot Functional mobility during ADLs: Moderate assistance, Rolling  walker (2 wheels) General ADL Comments: Pt able to ambulate in hall but with moderate assist due to ataxic movements and poor balance overall.     Mobility   Overal bed mobility: Modified Independent General bed mobility comments: Patient is still modified independent with supien to sit but requires extended time     Transfers   Overall transfer level: Needs assistance Equipment used: Rolling walker (2 wheels) Transfers: Sit to/from Stand, Bed to chair/wheelchair/BSC Sit to Stand: Min assist, Mod assist Bed to/from chair/wheelchair/BSC transfer type:: Step pivot Step pivot transfers: Mod assist General transfer comment: Patient has improved with transfer ability with requiring min/mod assist but still presents with significant generalized weakness and impaired coordination impacting ability to transfer.     Ambulation / Gait / Stairs / Wheelchair Mobility   Ambulation/Gait Ambulation/Gait assistance: Mod assist Gait Distance (Feet): 60 Feet Assistive device: Rolling walker (2 wheels) Gait Pattern/deviations: Step-to pattern, Decreased step length - left, Decreased step length - right, Decreased stride length, Decreased weight shift to right, Wide base of support, Ataxic General Gait Details: Patient still presents with labored cadence with severe ataxia but has demonstrated minor improvements with gait speed, but needs cueing with RW for safety. Patient   was able to ambualte without a break during session but was still limited by fatigue. Gait pattern breakdown observed as patient became more fatigued with patient struggling with balance and coordination toward the end of gait. Gait velocity: decreased     Posture / Balance Dynamic Sitting Balance Sitting balance - Comments: seated at EOB Balance Overall balance assessment: Needs assistance Sitting-balance support: Feet supported, Bilateral upper extremity supported Sitting balance-Leahy Scale: Fair Sitting balance - Comments: seated at  EOB Postural control: Posterior lean Standing balance support: Bilateral upper extremity supported, During functional activity, Reliant on assistive device for balance Standing balance-Leahy Scale: Poor Standing balance comment: Patient reliant on RW for standing balance along with struggling with weight shifting and reaching outside BOS.     Special needs/care consideration Special service needs none    Previous Home Environment (from acute therapy documentation) Living Arrangements: Spouse/significant other  Lives With: Spouse Available Help at Discharge: Family, Available 24 hours/day Type of Home: House Home Layout: One level Home Access: Stairs to enter Entrance Stairs-Rails: None Entrance Stairs-Number of Steps: 2 Bathroom Shower/Tub: Chiropodist: Handicapped height Bathroom Accessibility: Yes Commercial Point: No Additional Comments: taken via PT note   Discharge Living Setting Plans for Discharge Living Setting: Patient's home Type of Home at Discharge: House Discharge Home Layout: One level Discharge Home Access: Stairs to enter Entrance Stairs-Rails: None Entrance Stairs-Number of Steps: 2 Discharge Bathroom Shower/Tub: Tub/shower unit Discharge Bathroom Toilet: Handicapped height Discharge Bathroom Accessibility: Yes How Accessible: Accessible via walker Does the patient have any problems obtaining your medications?: No   Social/Family/Support Systems Patient Roles: Spouse Contact Information: [phone] Anticipated Caregiver: Kipper Buch Ability/Limitations of Caregiver: Can provide 24/7 min A Caregiver Availability: 24/7 Discharge Plan Discussed with Primary Caregiver: Yes Is Caregiver In Agreement with Plan?: Yes Does Caregiver/Family have Issues with Lodging/Transportation while Pt is in Rehab?: No   Goals Patient/Family Goal for Rehab: PT/OT/SLP Supervision Expected length of stay: 5-7 days Pt/Family Agrees to Admission and  willing to participate: Yes Program Orientation Provided & Reviewed with Pt/Caregiver Including Roles  & Responsibilities: Yes   Decrease burden of Care through IP rehab admission: Specialzed equipment needs, Decrease number of caregivers, Bowel and bladder program, and Patient/family education   Possible need for SNF placement upon discharge: not anticipated  Patient Condition: I have reviewed medical records from [facility], spoken with CM, and patient. I met with patient at the bedside for inpatient rehabilitation assessment.  Patient will benefit from ongoing PT, OT, and SLP, can actively participate in 3 hours of therapy a day 5 days of the week, and can make measurable gains during the admission.  Patient will also benefit from the coordinated team approach during an Inpatient Acute Rehabilitation admission.  The patient will receive intensive therapy as well as Rehabilitation physician, nursing, social worker, and care management interventions.  Due to safety, skin/wound care, disease management, medication administration, pain management, and patient education the patient requires 24 hour a day rehabilitation nursing.  The patient is currently *** with mobility and basic ADLs.  Discharge setting and therapy post discharge at home with home health is anticipated.  Patient has agreed to participate in the Acute Inpatient Rehabilitation Program and will admit {Time; today/tomorrow:10263}.  Preadmission Screen Completed By:  Genella Mech, 10/13/2021 11:02 AM ______________________________________________________________________   Discussed status with Dr. Marland Kitchen on *** at *** and received approval for admission today.  Admission Coordinator:  Genella Mech, CCC-SLP, time Marland KitchenSudie Grumbling ***  Assessment/Plan: Diagnosis: Does the need for close, 24 hr/day Medical supervision in concert with the patient's rehab needs make it unreasonable for this patient to be served in a less intensive  setting? {yes_no_potentially:3041433} Co-Morbidities requiring supervision/potential complications: *** Due to {due to:3041434}, does the patient require 24 hr/day rehab nursing? {yes_no_potentially:3041433} Does the patient require coordinated care of a physician, rehab nurse, PT, OT, and SLP to address physical and functional deficits in the context of the above medical diagnosis(es)? {yes_no_potentially:3041433} Addressing deficits in the following areas: {deficits:3041436} Can the patient actively participate in an intensive therapy program of at least 3 hrs of therapy 5 days a week? {yes_no_potentially:3041433} The potential for patient to make measurable gains while on inpatient rehab is {potential:3041437} Anticipated functional outcomes upon discharge from inpatient rehab: {functional outcomes:304600100} PT, {functional outcomes:304600100} OT, {functional outcomes:304600100} SLP Estimated rehab length of stay to reach the above functional goals is: *** Anticipated discharge destination: {anticipated dc setting:21604} 10. Overall Rehab/Functional Prognosis: {potential:3041437}   MD Signature: *** 

## 2021-10-14 LAB — GLUCOSE, CAPILLARY
Glucose-Capillary: 123 mg/dL — ABNORMAL HIGH (ref 70–99)
Glucose-Capillary: 115 mg/dL — ABNORMAL HIGH (ref 70–99)
Glucose-Capillary: 100 mg/dL — ABNORMAL HIGH (ref 70–99)
Glucose-Capillary: 102 mg/dL — ABNORMAL HIGH (ref 70–99)
Glucose-Capillary: 114 mg/dL — ABNORMAL HIGH (ref 70–99)

## 2021-10-14 MED ORDER — AMIODARONE HCL 200 MG PO TABS: ORAL_TABLET | ORAL | 0 refills | Status: DC

## 2021-10-14 MED ORDER — LIDOCAINE-EPINEPHRINE 1 %-1:100000 IJ SOLN
INTRAMUSCULAR | Status: AC
  Filled 2021-10-14: qty 1

## 2021-10-14 NOTE — Care Management Important Message (Signed)
Important Message  Patient Details  Name: Javier [name] MRN: [id number] Date of Birth: [birth year]   Medicare Important Message Given:  Yes     Shelda Altes 10/14/2021, 8:35 AM

## 2021-10-14 NOTE — Discharge Summary (Signed)
Physician Discharge Summary   Patient: Javier [name] MRN: [id number] DOB: June 22, 1945  Admit date:     10/09/2021  Discharge date: 10/14/21  Discharge Physician: Little Ishikawa   PCP: Pcp, No   Recommendations at discharge:  Follow-up with PCP in the next 1 to 2 weeks as scheduled Follow-up with home health PT as scheduled Follow up with cardiology for SVT, 30-day cardiac monitoring, discharged on metoprolol '25mg'$  daily and amiodarone '200mg'$  daily.   Discharge Diagnoses: Principal Problem:   SVT (supraventricular tachycardia) (HCC) Active Problems:   Essential hypertension   Hyperlipidemia   Cerebellar stroke ([place])   Malnutrition of moderate degree  Hospital Course: Javier [name] is a 76 y.o. male with a history of HTN, HLD, alcohol abuse and recent admission 5/24 - 5/30 for acute cerebellar CVA, subsequently discharged to CIR where he was noted to have SVT for which cardiology had been consulting. HR reached 160's during therapy, down to 140 with metoprolol, and he was transferred to inpatient hospital for continuous cardiac monitoring and amiodarone treatment. HR has reached into 40's, so beta blocker is lowered with improvement. No further episodes of SVT since transfer from CIR. He has been cleared for discharge by cardiology. Due to progression with rehab patient is now appropriate for discharge back home with home health PT, his insurance has denied readmission to CIR given his marked improvement, patient and wife are both agreeable for discharge back home.  Assessment and Plan: SVT:  - Resolved >72 hrs. Continue amiodarone '200mg'$  BID x2 weeks, then '200mg'$  daily.  - Please contact cardiology at time of discharge from rehab to assist with follow up scheduling and 30-day cardiac monitor arrangement.   Sinus bradycardia: Asymptomatic.  - Continue metoprolol at low dose.    Acute right cerebellar CVA with dysphagia: Has received inpatient rehabilitation services.  - PT,  OT, SLP evaluated pt, recommend continuing inpatient rehab. Stable for discharge. - Continue DAPT x90 days, then aspirin alone, high-intensity statin.  - Outpatient stroke neurology follow up.    HTN:  - Continue ARB, BB   HLD:  - Continue rosuvastatin   Alcohol abuse:  - No evidence of withdrawal currently.    Moderate protein calorie malnutrition:  - Supplement protein and vitamins.   Consultants: Cardiology Procedures performed: None  Disposition:  Home with home health Diet recommendation:  Dysphagia type 3 thin Liquid DISCHARGE MEDICATION: Allergies as of 10/14/2021   No Known Allergies      Medication List     TAKE these medications    acetaminophen 325 MG tablet Commonly known as: TYLENOL Take 1-2 tablets (325-650 mg total) by mouth every 4 (four) hours as needed for mild pain.   amiodarone 200 MG tablet Commonly known as: PACERONE Take 1 tablet (200 mg total) by mouth 2 (two) times daily for 14 days, THEN 1 tablet (200 mg total) daily for 14 days. Start taking on: October 12, 2021   aspirin EC 81 MG tablet Take 1 tablet (81 mg total) by mouth daily. Swallow whole.   clopidogrel 75 MG tablet Commonly known as: Plavix Take 1 tablet (75 mg total) by mouth daily.   irbesartan 75 MG tablet Commonly known as: AVAPRO Take 0.5 tablets (37.5 mg total) by mouth daily.   methocarbamol 500 MG tablet Commonly known as: ROBAXIN Take 1 tablet (500 mg total) by mouth every 6 (six) hours as needed for muscle spasms.   metoprolol succinate 25 MG 24 hr tablet Commonly known as: TOPROL-XL Take  1 tablet (25 mg total) by mouth daily. Take with or immediately following a meal. What changed:  medication strength how much to take   pantoprazole 40 MG tablet Commonly known as: PROTONIX Take 1 tablet (40 mg total) by mouth 2 (two) times daily.   rosuvastatin 20 MG tablet Commonly known as: CRESTOR Take 20 mg by mouth daily.        Discharge Exam: Filed Weights    10/09/21 1650  Weight: 69 kg  BP 124/61 (BP Location: Right Arm)   Pulse 60   Temp 97.6 F (36.4 C) (Oral)   Resp 16   Ht '5\' 11"'$  (1.803 m)   Wt 69 kg   SpO2 97%   BMI 21.22 kg/m   Chronically ill-appearing male in no distress Regular bradycardia, no edema Clear, nonlabored Alert, oriented though cognition is not 100%, he's interactive. Mild ataxia without focal sensory motor weakness.   Condition at discharge: stable  The results of significant diagnostics from this hospitalization (including imaging, microbiology, ancillary and laboratory) are listed below for reference.   Imaging Studies: DG Chest 2 View  Result Date: 10/08/2021 CLINICAL DATA:  Cough.  Episode of tachycardia yesterday. EXAM: CHEST - 2 VIEW COMPARISON:  None Available. FINDINGS: The cardiomediastinal silhouette is within normal limits. No airspace consolidation, edema, pleural effusion, or pneumothorax is identified. Multiple old left-sided rib fractures are noted. IMPRESSION: No active cardiopulmonary disease. Electronically Signed   By: Logan Bores M.D.   On: 10/08/2021 15:10   DG Swallowing Func-Speech Pathology  Result Date: 09/27/2021 Table formatting from the original result was not included. Objective Swallowing Evaluation: Type of Study: MBS-Modified Barium Swallow Study  Patient Details Name: Javier [name] MRN: [id number] Date of Birth: 1945-09-14 Today's Date: 09/27/2021 Time: SLP Start Time (ACUTE ONLY): 1100 -SLP Stop Time (ACUTE ONLY): 1117 SLP Time Calculation (min) (ACUTE ONLY): 17 min Past Medical History: Past Medical History: Diagnosis Date  Cataract   HLD (hyperlipidemia)   Hypertension  Past Surgical History: Past Surgical History: Procedure Laterality Date  COLONOSCOPY  11/19/2009  RMR: 1. Multiple small rectal polyps status post snare polypectomy. 2. Multiple left colon polyp status post snare removal . Poor prep compromised exam.   COLONOSCOPY N/A 12/13/2014  Procedure: COLONOSCOPY;  Surgeon:  Daneil Dolin, MD;  [facility];  Service: Endoscopy;  Laterality: N/A;  945  COLONOSCOPY N/A 07/18/2020  Procedure: COLONOSCOPY;  Surgeon: Daneil Dolin, MD;  Location: [facility];  Service: Endoscopy;  Laterality: N/A;  am  FLEXIBLE SIGMOIDOSCOPY N/A 04/25/2020  Procedure: FLEXIBLE SIGMOIDOSCOPY;  Surgeon: Daneil Dolin, MD; incomplete colonoscopy with flex sig only due to poor prep.  HERNIA REPAIR    POLYPECTOMY  07/18/2020  Procedure: POLYPECTOMY;  Surgeon: Daneil Dolin, MD;  Location: [facility];  Service: Endoscopy;; HPI: This is a 76 year old male who presents by EMS with concerns for dizziness.  Patient states that he woke up this morning and felt weak and dizzy.  He has been nauseated.  He denies room spinning dizziness.  Patient reports that he drinks daily.  When asked, she drank last night he cannot quantify.  Patient states that every time he gets up "I just have to fall back down."  EMS noted that he was able to ambulate to the stretcher.  He denies any abdominal pain but has had 1 episode of nonbilious, nonbloody emesis.  No recent illnesses or fevers. 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 Two small foci of acute infarction in the  right cerebellum. Patent vertebrobasilar arteries. 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 BSE completed on 09/26/21 initiating Dysphagia 3/thin w/ precautions; concerns for aspiration.  Subjective: "I"ve been coughing"  Recommendations for follow up therapy are one component of a multi-disciplinary discharge planning process, led by the attending physician.  Recommendations may be updated based on patient status, additional functional criteria and insurance authorization. Assessment / Plan / Recommendation   09/27/2021  11:00 AM Clinical Impressions Clinical Impression Pt presents with mild oropharyngeal dysphagia characterized by impaired mastication (likely d/t edentulous state, pt does not have dentures  present for study), delay in the initiaton of the swallow to valleculae and one incidence to the pyriform sinuses (with thin via tsp).  Decreased laryngeal elevation noted during study which primarily impacted thin via larger volumes (successive swallows) via cup d/t decreased airway closure and trace aspiration which was cleared via throat clear or cough with cueing from SLP.  Smaller volumes of thin liquids did not elicit this response.  Good oral/pharyngeal clearance noted throughout study and no evidence of aspiration observed with any other consistency.  Pt with impulsive tendency, so larger volumes overall with liquids/foods place him at a higher aspiration risk  paired with impulsivity.  Recommend continuing Dysphagia 3/thin via small SUPERVISED amounts.  FULL supervision required during meal intake with precautions in place at all times. SLP Visit Diagnosis Dysphagia, oropharyngeal phase (R13.12) Impact on safety and function Mild aspiration risk;Moderate aspiration risk     09/27/2021  11:00 AM Treatment Recommendations Treatment Recommendations Therapy as outlined in treatment plan below     09/27/2021  11:00 AM Prognosis Prognosis for Safe Diet Advancement Good   09/27/2021  11:00 AM Diet Recommendations SLP Diet Recommendations Dysphagia 3 (Mech soft) solids;Thin liquid Liquid Administration via Cup;No straw Medication Administration Whole meds with puree Compensations Slow rate;Small sips/bites;Minimize environmental distractions;Clear throat intermittently;Multiple dry swallows after each bite/sip Postural Changes Seated upright at 90 degrees     09/27/2021  11:00 AM Other Recommendations Oral Care Recommendations Oral care BID;Staff/trained caregiver to provide oral care Follow Up Recommendations Follow physician's recommendations for discharge plan and follow up therapies Assistance recommended at discharge Frequent or constant Supervision/Assistance Functional Status Assessment Patient has had a recent  decline in their functional status and demonstrates the ability to make significant improvements in function in a reasonable and predictable amount of time.   09/27/2021  11:00 AM Frequency and Duration  Speech Therapy Frequency (ACUTE ONLY) min 2x/week Treatment Duration 1 week     09/27/2021  11:00 AM Oral Phase Oral Phase [facility]    09/27/2021  11:00 AM Pharyngeal Phase Pharyngeal Phase Impaired Pharyngeal- Nectar Teaspoon NT Pharyngeal Material does not enter airway Pharyngeal- Nectar Cup Delayed swallow initiation-vallecula Pharyngeal Material does not enter airway Pharyngeal- Nectar Straw NT Pharyngeal- Thin Teaspoon Delayed swallow initiation-pyriform sinuses;Reduced laryngeal elevation Pharyngeal Material does not enter airway Pharyngeal- Thin Cup Reduced laryngeal elevation;Reduced airway/laryngeal closure;Delayed swallow initiation-vallecula Pharyngeal Material enters airway, remains ABOVE vocal cords then ejected out Pharyngeal- Thin Straw NT Pharyngeal- Puree Delayed swallow initiation-vallecula Pharyngeal Material does not enter airway Pharyngeal- Mechanical Soft Delayed swallow initiation-vallecula Pharyngeal Material does not enter airway Pharyngeal- Multi-consistency NT Pharyngeal- Pill Delayed swallow initiation-vallecula Pharyngeal Material does not enter airway    09/27/2021  11:00 AM Cervical Esophageal Phase  Cervical Esophageal Phase [facility] Elvina Sidle, M.S., CCC-SLP 09/27/2021, 12:12 PM  US Carotid Bilateral  Result Date: 09/26/2021 CLINICAL DATA:  Stroke. History of hypertension and hyperlipidemia. Former smoker. EXAM: BILATERAL CAROTID DUPLEX ULTRASOUND TECHNIQUE: Pearline Cables scale imaging, color Doppler and duplex ultrasound were performed of bilateral carotid and vertebral arteries in the neck. COMPARISON:  Brain MRI-09/25/2021 FINDINGS: Criteria: Quantification of carotid stenosis is based on velocity parameters that correlate the residual internal carotid diameter with NASCET-based  stenosis levels, using the diameter of the distal internal carotid lumen as the denominator for stenosis measurement. The following velocity measurements were obtained: RIGHT ICA: 69/19 cm/sec CCA: 382/5 cm/sec SYSTOLIC ICA/CCA RATIO:  0.6 ECA: 67 cm/sec LEFT ICA: 95/24 cm/sec CCA: 05/3 cm/sec SYSTOLIC ICA/CCA RATIO:  1.5 ECA: 64 cm/sec RIGHT CAROTID ARTERY: There is a minimal amount of eccentric echogenic plaque scattered throughout the right common carotid artery. There is a minimal amount of eccentric echogenic plaque within the right carotid bulb, extending to involve the origin and proximal aspects of the right internal carotid artery (image 24), not resulting in elevated peak systolic velocities within the interrogated course of the right internal carotid artery to suggest a hemodynamically significant stenosis. RIGHT VERTEBRAL ARTERY:  Not visualized LEFT CAROTID ARTERY: There is a minimal amount of atherosclerotic plaque scattered throughout the left common carotid artery. There is a minimal amount of eccentric echogenic plaque involving the origin and proximal aspects of the left internal carotid artery (image 58), not resulting in elevated peak systolic velocities within the interrogated course of the left internal carotid artery to suggest a hemodynamically significant stenosis. LEFT VERTEBRAL ARTERY:  Antegrade Flow IMPRESSION: 1. Minimal amount of bilateral atherosclerotic plaque, right subjectively greater than left, not resulting in a hemodynamically significant stenosis within either internal carotid artery. 2. Nonvisualization of the right vertebral artery, an age-indeterminate finding in the absence of prior examinations. 3. Patent left vertebral artery. Electronically Signed   By: Sandi Mariscal M.D.   On: 09/26/2021 08:55   ECHOCARDIOGRAM COMPLETE  Result Date: 09/25/2021    ECHOCARDIOGRAM REPORT   Patient Name:   Javier [name] Date of Exam: 09/25/2021 Medical Rec #:  [id number]            Height:       71.0 in Accession #:    [id number]          Weight:       154.0 lb Date of Birth:  19-Feb-1946            BSA:          1.887 m Patient Age:    45 years            BP:           152/76 mmHg Patient Gender: M                   HR:           63 bpm. Exam Location:  [facility] Procedure: 2D Echo, Cardiac Doppler and Color Doppler Indications:   Stroke  History:       Patient has no prior history of Echocardiogram examinations.                Stroke.  Sonographer:   Wenda Low Referring      [place] Phys:  Sonographer Comments: Image acquisition challenging due to respiratory motion. IMPRESSIONS  1. Left ventricular ejection fraction, by estimation, is 65 to 70%. The left ventricle has normal function. The left ventricle has no regional wall motion abnormalities.  There is mild asymmetric left ventricular hypertrophy of the basal segment. Left ventricular diastolic parameters were normal.  2. Right ventricular systolic function is normal. The right ventricular size is normal. There is normal pulmonary artery systolic pressure. The estimated right ventricular systolic pressure is 63.8 mmHg.  3. Left atrial size was mildly dilated.  4. The mitral valve is grossly normal. Trivial mitral valve regurgitation.  5. The aortic valve is tricuspid. Aortic valve regurgitation is not visualized. Aortic valve mean gradient measures 2.0 mmHg.  6. Aortic dilatation noted. There is mild dilatation of the aortic root, measuring 40 mm.  7. The inferior vena cava is normal in size with greater than 50% respiratory variability, suggesting right atrial pressure of 3 mmHg. Comparison(s): No prior Echocardiogram. FINDINGS  Left Ventricle: Left ventricular ejection fraction, by estimation, is 65 to 70%. The left ventricle has normal function. The left ventricle has no regional wall motion abnormalities. The left ventricular internal cavity size was normal in size. There is  mild asymmetric left ventricular  hypertrophy of the basal segment. Left ventricular diastolic parameters were normal. Right Ventricle: The right ventricular size is normal. No increase in right ventricular wall thickness. Right ventricular systolic function is normal. There is normal pulmonary artery systolic pressure. The tricuspid regurgitant velocity is 2.35 m/s, and  with an assumed right atrial pressure of 3 mmHg, the estimated right ventricular systolic pressure is 46.6 mmHg. Left Atrium: Left atrial size was mildly dilated. Right Atrium: Right atrial size was normal in size. Pericardium: There is no evidence of pericardial effusion. Mitral Valve: The mitral valve is grossly normal. Trivial mitral valve regurgitation. MV peak gradient, 3.2 mmHg. The mean mitral valve gradient is 1.0 mmHg. Tricuspid Valve: The tricuspid valve is grossly normal. Tricuspid valve regurgitation is mild. Aortic Valve: The aortic valve is tricuspid. There is mild aortic valve annular calcification. Aortic valve regurgitation is not visualized. Aortic valve mean gradient measures 2.0 mmHg. Aortic valve peak gradient measures 3.9 mmHg. Aortic valve area, by  VTI measures 4.51 cm. Pulmonic Valve: The pulmonic valve was grossly normal. Pulmonic valve regurgitation is trivial. Aorta: Aortic dilatation noted. There is mild dilatation of the aortic root, measuring 40 mm. Venous: The inferior vena cava is normal in size with greater than 50% respiratory variability, suggesting right atrial pressure of 3 mmHg. IAS/Shunts: No atrial level shunt detected by color flow Doppler.  LEFT VENTRICLE PLAX 2D LVIDd:         4.30 cm      Diastology LVIDs:         2.10 cm      LV e' medial:    9.14 cm/s LV PW:         1.10 cm      LV E/e' medial:  8.2 LV IVS:        1.20 cm      LV e' lateral:   9.36 cm/s LVOT diam:     2.50 cm      LV E/e' lateral: 8.0 LV SV:         112 LV SV Index:   60 LVOT Area:     4.91 cm  LV Volumes (MOD) LV vol d, MOD A2C: 101.0 ml LV vol d, MOD A4C: 82.8 ml  LV vol s, MOD A2C: 29.7 ml LV vol s, MOD A4C: 27.7 ml LV SV MOD A2C:     71.3 ml LV SV MOD A4C:     82.8 ml LV SV MOD BP:  63.0 ml RIGHT VENTRICLE RV Basal diam:  3.75 cm RV Mid diam:    3.10 cm RV S prime:     10.30 cm/s TAPSE (M-mode): 2.8 cm LEFT ATRIUM             Index        RIGHT ATRIUM           Index LA diam:        3.60 cm 1.91 cm/m   RA Area:     19.00 cm LA Vol (A2C):   77.6 ml 41.13 ml/m  RA Volume:   57.20 ml  30.31 ml/m LA Vol (A4C):   65.0 ml 34.45 ml/m LA Biplane Vol: 72.7 ml 38.53 ml/m  AORTIC VALVE                    PULMONIC VALVE AV Area (Vmax):    4.33 cm     PV Vmax:       0.66 m/s AV Area (Vmean):   4.50 cm     PV Peak grad:  1.8 mmHg AV Area (VTI):     4.51 cm AV Vmax:           99.20 cm/s AV Vmean:          65.400 cm/s AV VTI:            0.249 m AV Peak Grad:      3.9 mmHg AV Mean Grad:      2.0 mmHg LVOT Vmax:         87.50 cm/s LVOT Vmean:        60.000 cm/s LVOT VTI:          0.229 m LVOT/AV VTI ratio: 0.92  AORTA Ao Root diam: 4.00 cm Ao Asc diam:  3.60 cm MITRAL VALVE               TRICUSPID VALVE MV Area (PHT): 3.50 cm    TR Peak grad:   22.1 mmHg MV Area VTI:   3.29 cm    TR Vmax:        235.00 cm/s MV Peak grad:  3.2 mmHg MV Mean grad:  1.0 mmHg    SHUNTS MV Vmax:       0.89 m/s    Systemic VTI:  0.23 m MV Vmean:      41.8 cm/s   Systemic Diam: 2.50 cm MV Decel Time: 217 msec MV E velocity: 75.00 cm/s MV A velocity: 66.80 cm/s MV E/A ratio:  1.12 Rozann Lesches MD Electronically signed by Rozann Lesches MD Signature Date/Time: 09/25/2021/2:30:39 PM    Final    MR BRAIN WO CONTRAST  Result Date: 09/25/2021 CLINICAL DATA:  Dizziness, persistent/recurrent, cardiac or vascular cause suspected EXAM: MRI HEAD WITHOUT CONTRAST MRA HEAD WITHOUT CONTRAST TECHNIQUE: Multiplanar, multi-echo pulse sequences of the brain and surrounding structures were acquired without intravenous contrast. Angiographic images of the Circle of Willis were acquired using MRA technique without  intravenous contrast. COMPARISON:  CT from earlier same day FINDINGS: MRI HEAD Patient could not tolerate all sequences. Brain: There is mildly reduced diffusion in the superior left cerebellum involving the hemisphere and vermis. There is extension into the dorsal left midbrain with involvement of the superior cerebellar peduncle. Additional small area in the inferior left cerebellum. Two small foci of reduced diffusion in the right cerebellum. No evidence of hemorrhage. Additional chronic bilateral cerebellar and left occipital infarcts. Patchy foci of T2 hyperintensity in the supratentorial white matter  are nonspecific but probably reflect chronic microvascular ischemic changes. Prominence of the ventricles and sulci reflects parenchymal volume loss. There is no intracranial mass or mass effect. No extra-axial collection. No hydrocephalus. Vascular: Major vessel flow voids at the skull base are preserved. Skull and upper cervical spine: Normal marrow signal is preserved. Sinuses/Orbits: Mild patchy mucosal thickening. Bilateral lens replacements. Other: Sella is unremarkable.  Mastoid air cells are clear. MRA HEAD Motion artifact is present. Intracranial internal carotid arteries are patent with atherosclerotic irregularity. Middle and anterior cerebral arteries are patent. Intracranial vertebral arteries are patent with atherosclerotic irregularity. Patent PICA origins, more irregular on the left. Likely patent AICA origins. Right SCA origin is patent. The left SCA origin is not identified. Bilateral posterior communicating arteries are present. Posterior cerebral arteries are patent. Bilateral posterior communicating arteries are present. IMPRESSION: Acute infarcts involving the left cerebellum extending into the dorsal left midbrain. Two small foci of acute infarction in the right cerebellum. Patent vertebrobasilar arteries. Left SCA may be occluded. Chronic bilateral cerebellar and left occipital infarcts.  Chronic microvascular ischemic changes. Electronically Signed   By: Macy Mis M.D.   On: 09/25/2021 08:03   MR ANGIO HEAD WO CONTRAST  Result Date: 09/25/2021 CLINICAL DATA:  Dizziness, persistent/recurrent, cardiac or vascular cause suspected EXAM: MRI HEAD WITHOUT CONTRAST MRA HEAD WITHOUT CONTRAST TECHNIQUE: Multiplanar, multi-echo pulse sequences of the brain and surrounding structures were acquired without intravenous contrast. Angiographic images of the Circle of Willis were acquired using MRA technique without intravenous contrast. COMPARISON:  CT from earlier same day FINDINGS: MRI HEAD Patient could not tolerate all sequences. Brain: There is mildly reduced diffusion in the superior left cerebellum involving the hemisphere and vermis. There is extension into the dorsal left midbrain with involvement of the superior cerebellar peduncle. Additional small area in the inferior left cerebellum. Two small foci of reduced diffusion in the right cerebellum. No evidence of hemorrhage. Additional chronic bilateral cerebellar and left occipital infarcts. Patchy foci of T2 hyperintensity in the supratentorial white matter are nonspecific but probably reflect chronic microvascular ischemic changes. Prominence of the ventricles and sulci reflects parenchymal volume loss. There is no intracranial mass or mass effect. No extra-axial collection. No hydrocephalus. Vascular: Major vessel flow voids at the skull base are preserved. Skull and upper cervical spine: Normal marrow signal is preserved. Sinuses/Orbits: Mild patchy mucosal thickening. Bilateral lens replacements. Other: Sella is unremarkable.  Mastoid air cells are clear. MRA HEAD Motion artifact is present. Intracranial internal carotid arteries are patent with atherosclerotic irregularity. Middle and anterior cerebral arteries are patent. Intracranial vertebral arteries are patent with atherosclerotic irregularity. Patent PICA origins, more irregular on  the left. Likely patent AICA origins. Right SCA origin is patent. The left SCA origin is not identified. Bilateral posterior communicating arteries are present. Posterior cerebral arteries are patent. Bilateral posterior communicating arteries are present. IMPRESSION: Acute infarcts involving the left cerebellum extending into the dorsal left midbrain. Two small foci of acute infarction in the right cerebellum. Patent vertebrobasilar arteries. Left SCA may be occluded. 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 microvascular ischemic changes. Electronically Signed   By: Macy Mis M.D.   On: 09/25/2021 08:03   CT Head Wo Contrast  Result Date: 09/25/2021 CLINICAL DATA:  Dizziness, persistent/recurrent with cardiac or vascular cause suspected. EXAM: CT HEAD WITHOUT CONTRAST TECHNIQUE: Contiguous axial images were obtained from the base of the skull through the vertex without intravenous contrast. RADIATION DOSE REDUCTION: This exam was performed according to the  departmental dose-optimization program which includes automated exposure control, adjustment of the mA and/or kV according to patient size and/or use of iterative reconstruction technique. COMPARISON:  None Available. FINDINGS: Brain: No evidence of acute infarction, hemorrhage, hydrocephalus, extra-axial collection or mass lesion/mass effect. Well-defined/chronic appearing infarcts in the left more than right cerebellum and left occipital cortex. Vascular: No hyperdense vessel or unexpected calcification. Skull: Normal. Negative for fracture or focal lesion. Sinuses/Orbits: No acute finding. IMPRESSION: 1. No acute finding. 2. 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 Electronically Signed   By: Jorje Guild M.D.   On: 09/25/2021 04:34    Microbiology: Results for orders placed or performed during the hospital encounter of 07/16/20  SARS CORONAVIRUS 2 (TAT 6-24 HRS) Nasopharyngeal  Nasopharyngeal Swab     Status: None   Collection Time: 07/16/20  9:02 AM   Specimen: Nasopharyngeal Swab  Result Value Ref Range Status   SARS Coronavirus 2 NEGATIVE NEGATIVE Final    Comment: (NOTE) SARS-CoV-2 target nucleic acids are NOT DETECTED.  The SARS-CoV-2 RNA is generally detectable in upper and lower respiratory specimens during the acute phase of infection. Negative results do not preclude SARS-CoV-2 infection, do not rule out co-infections with other pathogens, and should not be used as the sole basis for treatment or other patient management decisions. Negative results must be combined with clinical observations, patient history, and epidemiological information. The expected result is Negative.  Fact Sheet for Patients: SugarRoll.be  Fact Sheet for Healthcare Providers: https://www.woods-mathews.com/  This test is not yet approved or cleared by the [place] FDA and  has been authorized for detection and/or diagnosis of SARS-CoV-2 by FDA under an Emergency Use Authorization (EUA). This EUA will remain  in effect (meaning this test can be used) for the duration of the COVID-19 declaration under Se ction 564(b)(1) of the Act, 21 U.S.C. section 360bbb-3(b)(1), unless the authorization is terminated or revoked sooner.  Performed at [facility], [place] [address] [place],  [address]     Labs: CBC: Recent Labs  Lab 10/10/21 0115  WBC 6.0  HGB 11.5*  HCT 34.8*  MCV 94.3  PLT 007    Basic Metabolic Panel: Recent Labs  Lab 10/07/21 1631 10/10/21 0115 10/11/21 0727  NA  --  137 135  K  --  4.0 4.3  CL  --  101 101  CO2  --  29 28  GLUCOSE  --  102* 98  BUN  --  18 19  CREATININE  --  0.96 0.97  CALCIUM  --  9.0 9.0  MG 1.7 1.7 1.9    Liver Function Tests: No results for input(s): "AST", "ALT", "ALKPHOS", "BILITOT", "PROT", "ALBUMIN" in the last 168 hours. CBG: Recent Labs  Lab  10/12/21 2112 10/13/21 1133 10/13/21 1540 10/13/21 2131 10/14/21 0735  GLUCAP 136* 98 118* 123* 115*     Discharge time spent: greater than 30 minutes.  Signed: Little Ishikawa, MD [facility] 10/14/2021

## 2021-10-14 NOTE — Progress Notes (Signed)
Physical Therapy Treatment Patient Details Name: Javier [name] MRN: [id number] DOB: 1945-10-24 Today's Date: 10/14/2021   History of Present Illness 76 yo male admitted from [facility] 6/7 with SVT. 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 PMHx: GERD, diverticulosis, HLD, HTN    PT Comments    PT with flat affect, remains aware of CVA but not SVT as cause for readmission. Pt able to don/doff both shoes with assist to tie one, washed hands at sink without UE support with cues for completion of task, walked with walker and able to complete pouring bottled water into cup with lip with increased time due to ataxia and cues. Pt reports wife walks with walker at times so he will need to be supervision level to return home safely. Will continue to follow.   HR 59-71 BP 143/82   Recommendations for follow up therapy are one component of a multi-disciplinary discharge planning process, led by the attending physician.  Recommendations may be updated based on patient status, additional functional criteria and insurance authorization.  Follow Up Recommendations  Acute inpatient rehab (3hours/day)     Assistance Recommended at Discharge Frequent or constant Supervision/Assistance  Patient can return home with the following A little help with walking and/or transfers;A little help with bathing/dressing/bathroom;Assistance with cooking/housework;Direct supervision/assist for financial management;Assist for transportation;Help with stairs or ramp for entrance;Direct supervision/assist for medications management   Equipment Recommendations  BSC/3in1;Rolling walker (2 wheels)    Recommendations for Other Services       Precautions / Restrictions Precautions Precautions: Fall;Other (comment) Precaution Comments: HOH, dysphagia diet, L hemibody ataxia     Mobility  Bed Mobility               General bed mobility comments: in chair on arrival and end of session    Transfers Overall  transfer level: Needs assistance   Transfers: Sit to/from Stand Sit to Stand: Min guard           General transfer comment: guarding with cues for safety, decreased control of descent to surface    Ambulation/Gait Ambulation/Gait assistance: Min guard Gait Distance (Feet): 250 Feet Assistive device: Rolling walker (2 wheels) Gait Pattern/deviations: Step-through pattern, Decreased stride length, Ataxic, Decreased stance time - left   Gait velocity interpretation: 1.31 - 2.62 ft/sec, indicative of limited community ambulator   General Gait Details: pt with mildly ataxic movement of LLE with stepping, reliant on RW with partially flexed trunk with cues for safety and proximity to RW. pt able to recall room number and way find to room   Stairs Stairs: Yes Stairs assistance: Min guard Stair Management: Step to pattern, Backwards, With walker Number of Stairs: 1 General stair comments: pt reports only 1 step to enter home and able to perform stepping backward with use of RW x 2 trials with guarding for safety   Wheelchair Mobility    Modified Rankin (Stroke Patients Only)       Balance Overall balance assessment: Needs assistance   Sitting balance-Leahy Scale: Good Sitting balance - Comments: static sitting and donning shoe with supervision at chair   Standing balance support: No upper extremity supported Standing balance-Leahy Scale: Poor Standing balance comment: static standing 2 min with min to minguard assist limited by ataxia of left side                            Cognition Arousal/Alertness: Awake/alert Behavior During Therapy: WFL for tasks assessed/performed Overall Cognitive  Status: Impaired/Different from baseline Area of Impairment: Orientation, Attention, Memory, Following commands, Safety/judgement                 Orientation Level: Disoriented to, Situation Current Attention Level: Sustained Memory: Decreased short-term  memory Following Commands: Follows one step commands consistently, Follows one step commands with increased time Safety/Judgement: Decreased awareness of deficits, Decreased awareness of safety     General Comments: unaware of situation, able to don/doff shoes needed assist to tie one shoe but not the other. able wash hands in standing with cues to complete rinsing        Exercises      General Comments        Pertinent Vitals/Pain Pain Assessment Pain Assessment: No/denies pain    Home Living                          Prior Function            PT Goals (current goals can now be found in the care plan section) Progress towards PT goals: Progressing toward goals    Frequency    Min 3X/week      PT Plan Current plan remains appropriate    Co-evaluation              AM-PAC PT "6 Clicks" Mobility   Outcome Measure  Help needed turning from your back to your side while in a flat bed without using bedrails?: None Help needed moving from lying on your back to sitting on the side of a flat bed without using bedrails?: A Little Help needed moving to and from a bed to a chair (including a wheelchair)?: A Little Help needed standing up from a chair using your arms (e.g., wheelchair or bedside chair)?: A Little Help needed to walk in hospital room?: A Little Help needed climbing 3-5 steps with a railing? : A Little 6 Click Score: 19    End of Session Equipment Utilized During Treatment: Gait belt Activity Tolerance: Patient tolerated treatment well Patient left: in chair;with call bell/phone within reach;with chair alarm set;with nursing/sitter in room Nurse Communication: Mobility status PT Visit Diagnosis: Unsteadiness on feet (R26.81);Other abnormalities of gait and mobility (R26.89);Muscle weakness (generalized) (M62.81);Ataxic gait (R26.0)     Time: 8546-2703 PT Time Calculation (min) (ACUTE ONLY): 24 min  Charges:  $Gait Training: 8-22  mins $Therapeutic Activity: 8-22 mins                     Bayard Males, PT Acute Rehabilitation Services Office: [place] 10/14/2021, 12:00 PM

## 2021-10-14 NOTE — Progress Notes (Signed)
Inpatient Rehab Admissions Coordinator:    I do not have insurance auth or a bed for this Pt. On CIR today. I will follow and pursue for admission pending insurance auth and bed availability.   Clemens Catholic, [place], [place] Admissions Coordinator  [phone] ([place]) [phone] (office)

## 2021-10-15 ENCOUNTER — Other Ambulatory Visit (HOSPITAL_COMMUNITY): Payer: Self-pay

## 2021-10-15 MED ORDER — AMIODARONE HCL 200 MG PO TABS
ORAL_TABLET | ORAL | 0 refills | Status: AC
  Filled 2021-10-15: qty 42, 28d supply, fill #0

## 2021-10-15 NOTE — Progress Notes (Signed)
Inpatient Rehab Admissions Coordinator:   Insurance denied CIR. Per MD, Pt. To d/c home. CIR will sign off.   Clemens Catholic, [place], [place] Admissions Coordinator  [phone] ([place]) [phone] (office)

## 2021-10-15 NOTE — TOC Transition Note (Signed)
Transition of Care [facility]) - CM/SW Discharge Note   Patient Details  Name: Javier [name] MRN: [id number] Date of Birth: 01/07/1946  [facility] CM/SW Contact:  Bethena Roys, RN Phone Number: 10/15/2021, 10:31 AM   Clinical Narrative:  [facility] can accept the patient for home health services. Unable to get insurance authorization for CIR on 10-14-21. Case Manager tried to arrange several home health agencies for the patient and was unable to secure care until this morning. [facility] will reach out to family within 24-48 hours post transition home. Patient has transportation home this morning. No further needs from Case Manager at this time.   Final next level of care: Home w Home Health Services Barriers to Discharge: No Barriers Identified  Readmission Risk Interventions     No data to display

## 2021-10-15 NOTE — Plan of Care (Signed)
  Problem: Education: Goal: Knowledge of General Education information will improve Description: Including pain rating scale, medication(s)/side effects and non-pharmacologic comfort measures Outcome: Progressing   Problem: Health Behavior/Discharge Planning: Goal: Ability to manage health-related needs will improve Outcome: Progressing   Problem: Clinical Measurements: Goal: Respiratory complications will improve Outcome: Progressing   Problem: Clinical Measurements: Goal: Cardiovascular complication will be avoided Outcome: Progressing   Problem: Safety: Goal: Ability to remain free from injury will improve Outcome: Progressing   

## 2021-10-25 DIAGNOSIS — G8194 Hemiplegia, unspecified affecting left nondominant side: Secondary | ICD-10-CM | POA: Insufficient documentation

## 2021-10-25 DIAGNOSIS — R7303 Prediabetes: Secondary | ICD-10-CM | POA: Insufficient documentation

## 2021-10-25 DIAGNOSIS — I639 Cerebral infarction, unspecified: Secondary | ICD-10-CM | POA: Insufficient documentation

## 2021-12-09 ENCOUNTER — Ambulatory Visit: Payer: Medicare Other | Admitting: General Practice

## 2021-12-25 ENCOUNTER — Other Ambulatory Visit (HOSPITAL_COMMUNITY): Payer: Self-pay

## 2022-02-06 ENCOUNTER — Encounter: Payer: Self-pay | Admitting: *Deleted

## 2022-02-10 DIAGNOSIS — Z742 Need for assistance at home and no other household member able to render care: Secondary | ICD-10-CM | POA: Insufficient documentation

## 2022-02-11 ENCOUNTER — Ambulatory Visit: Payer: Medicare Other | Admitting: Diagnostic Neuroimaging

## 2022-03-02 NOTE — Progress Notes (Deleted)
Cardiology Office Note:    Date:  03/02/2022   ID:  Arna Medici, DOB 02/12/1946, MRN [id number]  PCP:  Pcp, No  Cardiologist:  Donato Heinz, MD  Electrophysiologist:  None   Referring MD: Beverly Milch, NP   No chief complaint on file. ***  History of Present Illness:    Javier [name] is a 76 y.o. male with a hx of CVA 09/2021, hypertension, hyperlipidemia, alcohol abuse who presents for follow-up.  He was admitted with CVA 09/25/2021 and discharged to inpatient rehab on 10/01/2021.  Cardiology was consulted 10/07/2021 for tachycardia as patient was found to be in SVT with rates 140s.  He was started on Toprol-XL but developed bradycardia.  Started on amiodarone to maintain sinus rhythm.  Plan was for amiodarone 200 mg twice daily x2 weeks then decrease to 200 mg daily, along with Toprol-XL 25 mg daily  Past Medical History:  Diagnosis Date   Cataract    Diverticulosis    History of vitamin D deficiency    HLD (hyperlipidemia)    Hypertension    PAD (peripheral artery disease) (HCC)    Peripheral neuropathy    Stroke [facility])    SVT (supraventricular tachycardia)     Past Surgical History:  Procedure Laterality Date   COLONOSCOPY  11/19/2009   RMR: 1. Multiple small rectal polyps status post snare polypectomy. 2. Multiple left colon polyp status post snare removal . Poor prep compromised exam.    COLONOSCOPY N/A 12/13/2014   Procedure: COLONOSCOPY;  Surgeon: Daneil Dolin, MD;  [facility];  Service: Endoscopy;  Laterality: N/A;  945   COLONOSCOPY N/A 07/18/2020   Procedure: COLONOSCOPY;  Surgeon: Daneil Dolin, MD;  Location: [facility];  Service: Endoscopy;  Laterality: N/A;  am   FLEXIBLE SIGMOIDOSCOPY N/A 04/25/2020   Procedure: FLEXIBLE SIGMOIDOSCOPY;  Surgeon: Daneil Dolin, MD; incomplete colonoscopy with flex sig only due to poor prep.   HERNIA REPAIR     POLYPECTOMY  07/18/2020   Procedure: POLYPECTOMY;  Surgeon: Daneil Dolin,  MD;  Location: [facility];  Service: Endoscopy;;    Current Medications: No outpatient medications have been marked as taking for the 03/04/22 encounter (Appointment) with Donato Heinz, MD.     Allergies:   Patient has no known allergies.   Social History   Socioeconomic History   Marital status: Married    Spouse name: Antonieta Pert   Number of children: Not on file   Years of education: Not on file   Highest education level: Not on file  Occupational History   Not on file  Tobacco Use   Smoking status: Former   Smokeless tobacco: Never  Vaping Use   Vaping Use: Never used  Substance and Sexual Activity   Alcohol use: Yes    Comment: Gin - twice per week; no Gin currently. Beer occasionally.    Drug use: No   Sexual activity: Not on file  Other Topics Concern   Not on file  Social History Narrative   Lives with wife   Social Determinants of Health   Financial Resource Strain: Not on file  Food Insecurity: Not on file  Transportation Needs: Not on file  Physical Activity: Not on file  Stress: Not on file  Social Connections: Not on file     Family History: The patient's ***family history includes Heart attack in his father; Stroke in his father and mother. There is no history of Colon cancer.  ROS:   Please see the history of present illness.    *** All other systems reviewed and are negative.  EKGs/Labs/Other Studies Reviewed:    The following studies were reviewed today: ***  EKG:  EKG is *** ordered today.  The ekg ordered today demonstrates ***  Recent Labs: 10/02/2021: ALT 14 10/07/2021: TSH 1.710 10/10/2021: Hemoglobin 11.5; Platelets 271 10/11/2021: BUN 19; Creatinine, Ser 0.97; Magnesium 1.9; Potassium 4.3; Sodium 135  Recent Lipid Panel    Component Value Date/Time   CHOL 172 09/26/2021 0500   TRIG 90 09/26/2021 0500   HDL 46 09/26/2021 0500   CHOLHDL 3.7 09/26/2021 0500   VLDL 18 09/26/2021 0500   LDLCALC 108 (H) 09/26/2021 0500     Physical Exam:    VS:  There were no vitals taken for this visit.    Wt Readings from Last 3 Encounters:  10/09/21 152 lb 1.9 oz (69 kg)  10/08/21 151 lb 14.4 oz (68.9 kg)  09/25/21 154 lb (69.9 kg)     GEN: *** Well nourished, well developed in no acute distress HEENT: Normal NECK: No JVD; No carotid bruits LYMPHATICS: No lymphadenopathy CARDIAC: ***RRR, no murmurs, rubs, gallops RESPIRATORY:  Clear to auscultation without rales, wheezing or rhonchi  ABDOMEN: Soft, non-tender, non-distended MUSCULOSKELETAL:  No edema; No deformity  SKIN: Warm and dry NEUROLOGIC:  Alert and oriented x 3 PSYCHIATRIC:  Normal affect   ASSESSMENT:    No diagnosis found. PLAN:    SVT: Noted during hospitalization June 2023, rates as high as 150s -Continue amiodarone 200 mg daily -Continue Toprol-XL 25 mg daily  Hypertension: On irbesartan 37.5 mg daily and Toprol-XL 25 mg daily  CVA: Continue aspirin, statin -Recommend 30-day monitor  Hyperlipidemia: Not on rosuvastatin 20 mg daily  RTC in***   Medication Adjustments/Labs and Tests Ordered: Current medicines are reviewed at length with the patient today.  Concerns regarding medicines are outlined above.  No orders of the defined types were placed in this encounter.  No orders of the defined types were placed in this encounter.   There are no Patient Instructions on file for this visit.   Signed, Donato Heinz, MD  03/02/2022 11:00 PM    [name]

## 2022-03-04 ENCOUNTER — Ambulatory Visit: Payer: Medicare Other | Admitting: Cardiology

## 2022-03-05 ENCOUNTER — Encounter: Payer: Self-pay | Admitting: Cardiology

## 2022-06-11 ENCOUNTER — Other Ambulatory Visit (HOSPITAL_COMMUNITY): Payer: Self-pay | Admitting: Adult Health

## 2022-06-11 ENCOUNTER — Ambulatory Visit (HOSPITAL_COMMUNITY)
Admission: RE | Admit: 2022-06-11 | Discharge: 2022-06-11 | Disposition: A | Discharge status: Home / Self Care | Payer: 59 | Source: Ambulatory Visit | Attending: Adult Health | Admitting: Adult Health

## 2022-06-11 DIAGNOSIS — M25561 Pain in right knee: Secondary | ICD-10-CM

## 2022-06-11 DIAGNOSIS — M79604 Pain in right leg: Secondary | ICD-10-CM

## 2022-07-09 DIAGNOSIS — M179 Osteoarthritis of knee, unspecified: Secondary | ICD-10-CM | POA: Diagnosis not present

## 2022-07-09 DIAGNOSIS — G301 Alzheimer's disease with late onset: Secondary | ICD-10-CM | POA: Diagnosis not present

## 2022-07-09 DIAGNOSIS — I10 Essential (primary) hypertension: Secondary | ICD-10-CM | POA: Diagnosis not present

## 2022-07-09 DIAGNOSIS — E7849 Other hyperlipidemia: Secondary | ICD-10-CM | POA: Diagnosis not present

## 2022-07-09 DIAGNOSIS — I498 Other specified cardiac arrhythmias: Secondary | ICD-10-CM | POA: Diagnosis not present

## 2022-08-11 DIAGNOSIS — G301 Alzheimer's disease with late onset: Secondary | ICD-10-CM | POA: Diagnosis not present

## 2022-08-11 DIAGNOSIS — I498 Other specified cardiac arrhythmias: Secondary | ICD-10-CM | POA: Diagnosis not present

## 2022-08-11 DIAGNOSIS — M179 Osteoarthritis of knee, unspecified: Secondary | ICD-10-CM | POA: Diagnosis not present

## 2022-08-11 DIAGNOSIS — E7849 Other hyperlipidemia: Secondary | ICD-10-CM | POA: Diagnosis not present

## 2022-08-11 DIAGNOSIS — R058 Other specified cough: Secondary | ICD-10-CM | POA: Diagnosis not present

## 2022-08-11 DIAGNOSIS — I10 Essential (primary) hypertension: Secondary | ICD-10-CM | POA: Diagnosis not present

## 2022-08-11 DIAGNOSIS — Z8673 Personal history of transient ischemic attack (TIA), and cerebral infarction without residual deficits: Secondary | ICD-10-CM | POA: Diagnosis not present

## 2022-09-09 DIAGNOSIS — F0280 Dementia in other diseases classified elsewhere without behavioral disturbance: Secondary | ICD-10-CM | POA: Insufficient documentation

## 2022-09-09 DIAGNOSIS — Z8673 Personal history of transient ischemic attack (TIA), and cerebral infarction without residual deficits: Secondary | ICD-10-CM | POA: Insufficient documentation

## 2022-09-09 DIAGNOSIS — I499 Cardiac arrhythmia, unspecified: Secondary | ICD-10-CM | POA: Insufficient documentation

## 2022-09-09 DIAGNOSIS — R059 Cough, unspecified: Secondary | ICD-10-CM | POA: Insufficient documentation

## 2022-11-25 ENCOUNTER — Encounter (HOSPITAL_COMMUNITY): Payer: Self-pay

## 2022-11-25 ENCOUNTER — Emergency Department (HOSPITAL_COMMUNITY): Payer: Medicare HMO

## 2022-11-25 ENCOUNTER — Emergency Department (HOSPITAL_COMMUNITY)
Admission: EM | Admit: 2022-11-25 | Discharge: 2022-11-25 | Disposition: A | Discharge status: Home / Self Care | Payer: Medicare HMO | Attending: Emergency Medicine | Admitting: Emergency Medicine

## 2022-11-25 ENCOUNTER — Other Ambulatory Visit: Payer: Self-pay

## 2022-11-25 DIAGNOSIS — Z7982 Long term (current) use of aspirin: Secondary | ICD-10-CM | POA: Diagnosis not present

## 2022-11-25 DIAGNOSIS — Z79899 Other long term (current) drug therapy: Secondary | ICD-10-CM | POA: Insufficient documentation

## 2022-11-25 DIAGNOSIS — M25551 Pain in right hip: Secondary | ICD-10-CM

## 2022-11-25 DIAGNOSIS — R030 Elevated blood-pressure reading, without diagnosis of hypertension: Secondary | ICD-10-CM

## 2022-11-25 DIAGNOSIS — I10 Essential (primary) hypertension: Secondary | ICD-10-CM

## 2022-11-25 DIAGNOSIS — M1611 Unilateral primary osteoarthritis, right hip: Secondary | ICD-10-CM

## 2022-11-25 HISTORY — DX: Unspecified osteoarthritis, unspecified site: M19.90

## 2022-11-25 MED ORDER — ACETAMINOPHEN 325 MG PO TABS
650.0000 mg | ORAL_TABLET | Freq: Once | ORAL | Status: AC
Start: 2022-11-25 — End: 2022-11-25
  Administered 2022-11-25: 650 mg via ORAL

## 2022-11-25 NOTE — ED Triage Notes (Signed)
Pt complains of right leg pain and swelling along with swelling in the groin. Pt states pain and swelling started last year. Pt states he's had stroke in the past. Wife states pt is having more difficulty with ADL's lately. Pt states he only has pain when he walks.

## 2022-11-25 NOTE — ED Provider Notes (Signed)
[place] EMERGENCY DEPARTMENT AT [facility] Provider Note   CSN: [id number] Arrival date & time: 11/25/22  2355     History  Chief Complaint  Patient presents with   Hip Pain    Javier [name] is a 77 y.o. male.  Pt c/o pain to right hip. Pt challenging historian, states pain has been there as long as one year. Had a fall at some time in past, but denies recent fall. Is able to walk on hip/leg. Denies knee or lower leg pain or swelling. No back pain or radicular pain. No leg numbness/weakness. No skin lesions or redness. No fever or chills.   The history is provided by the patient, medical records and the spouse. The history is limited by the condition of the patient.       Home Medications Prior to Admission medications   Medication Sig Start Date End Date Taking? Authorizing Provider  acetaminophen (TYLENOL) 325 MG tablet Take 1-2 tablets (325-650 mg total) by mouth every 4 (four) hours as needed for mild pain. 10/09/21   Angiulli, Mcarthur Rossetti, PA-C  amiodarone (PACERONE) 200 MG tablet Take 1 tablet by mouth twice daily for 14 days, then decrease to 1 tablet once daily. 10/15/21   Azucena Fallen, MD  aspirin EC 81 MG tablet Take 1 tablet (81 mg total) by mouth daily. Swallow whole. 09/26/21 12/20/22  Vassie Loll, MD  irbesartan (AVAPRO) 75 MG tablet Take 0.5 tablets (37.5 mg total) by mouth daily. 10/10/21   Angiulli, Mcarthur Rossetti, PA-C  methocarbamol (ROBAXIN) 500 MG tablet Take 1 tablet (500 mg total) by mouth every 6 (six) hours as needed for muscle spasms. 10/09/21   Angiulli, Mcarthur Rossetti, PA-C  metoprolol succinate (TOPROL-XL) 25 MG 24 hr tablet Take 1 tablet (25 mg total) by mouth daily. Take with or immediately following a meal. 10/12/21   Tyrone Nine, MD  pantoprazole (PROTONIX) 40 MG tablet Take 1 tablet (40 mg total) by mouth 2 (two) times daily. 09/26/21   Vassie Loll, MD  rosuvastatin (CRESTOR) 20 MG tablet Take 20 mg by mouth daily.     [provider]      Allergies    Patient has no known allergies.    Review of Systems   Review of Systems  Constitutional:  Negative for chills and fever.  Respiratory:  Negative for cough and shortness of breath.   Cardiovascular:  Negative for chest pain.  Gastrointestinal:  Negative for abdominal pain, nausea and vomiting.  Genitourinary:  Negative for flank pain.  Musculoskeletal:  Negative for back pain.  Skin:  Negative for rash and wound.  Neurological:  Negative for weakness, numbness and headaches.    Physical Exam Updated Vital Signs BP (!) 177/77 (BP Location: Left Arm)   Pulse (!) 53   Temp 98.5 F (36.9 C) (Oral)   Resp 16   Ht 1.803 m (5\' 11" )   Wt 77.6 kg   SpO2 99%   BMI 23.85 kg/m  Physical Exam Vitals and nursing note reviewed.  Constitutional:      Appearance: Normal appearance. He is well-developed.  HENT:     Head: Atraumatic.     Nose: Nose normal.     Mouth/Throat:     Mouth: Mucous membranes are moist.  Eyes:     General: No scleral icterus.    Conjunctiva/sclera: Conjunctivae normal.  Neck:     Trachea: No tracheal deviation.  Cardiovascular:     Rate and Rhythm:  Normal rate and regular rhythm.     Pulses: Normal pulses.     Heart sounds: Normal heart sounds. No murmur heard.    No friction rub. No gallop.  Pulmonary:     Effort: Pulmonary effort is normal. No accessory muscle usage or respiratory distress.     Breath sounds: Normal breath sounds.  Abdominal:     General: Bowel sounds are normal. There is no distension.     Palpations: Abdomen is soft. There is no mass.     Tenderness: There is no abdominal tenderness. There is no guarding.  Genitourinary:    Comments: No cva tenderness. Normal external gu exam, no scrotal or testicular pain, swelling, or tenderness.  Musculoskeletal:        General: No swelling.     Cervical back: Normal range of motion and neck supple. No rigidity.     Right lower leg: No edema.     Left lower  leg: No edema.     Comments: Mild tenderness and mild pain with active rom right hip. No pain w slow/passive rom right hip and knee. No RLE edema or swelling as compared to left. RLE is of normal color and warmth. Distal pulses palp. T/L/S spine non tender, aligned.   Skin:    General: Skin is warm and dry.     Findings: No rash.  Neurological:     Mental Status: He is alert.     Comments: Alert, speech clear. RLE nvi with intact motor/sens fxn.   Psychiatric:        Mood and Affect: Mood normal.     ED Results / Procedures / Treatments   Labs (all labs ordered are listed, but only abnormal results are displayed) Labs Reviewed - No data to display  EKG None  Radiology DG HIP UNILAT W OR W/O PELVIS 2-3 VIEWS RIGHT  Result Date: 11/25/2022 CLINICAL DATA:  Right leg pain and swelling with swelling in the groin. EXAM: DG HIP (WITH OR WITHOUT PELVIS) 2-3V RIGHT; RIGHT FEMUR 2 VIEWS COMPARISON:  None Available. FINDINGS: Hips: There is severe right femoroacetabular joint space narrowing with associated subchondral sclerosis, subchondral cystic change, and osteophytosis consistent with osteoarthritis. There is mild degenerative change about the left hip. Femoroacetabular alignment is normal. The SI joints and symphysis pubis are intact. There is no erosive change. The soft tissues are unremarkable. Femur: Remainder of the right femur is unremarkable in appearance. There is no erosive change. Knee alignment appears maintained. Vascular calcifications are noted. The soft tissues are otherwise unremarkable. IMPRESSION: 1. Severe osteoarthritis of the right hip. Mild degenerative change about the left hip. 2. Otherwise unremarkable appearance of the right femur. Electronically Signed   By: Lesia Hausen M.D.   On: 11/25/2022 09:44   DG Femur Min 2 Views Right  Result Date: 11/25/2022 CLINICAL DATA:  Right leg pain and swelling with swelling in the groin. EXAM: DG HIP (WITH OR WITHOUT PELVIS) 2-3V  RIGHT; RIGHT FEMUR 2 VIEWS COMPARISON:  None Available. FINDINGS: Hips: There is severe right femoroacetabular joint space narrowing with associated subchondral sclerosis, subchondral cystic change, and osteophytosis consistent with osteoarthritis. There is mild degenerative change about the left hip. Femoroacetabular alignment is normal. The SI joints and symphysis pubis are intact. There is no erosive change. The soft tissues are unremarkable. Femur: Remainder of the right femur is unremarkable in appearance. There is no erosive change. Knee alignment appears maintained. Vascular calcifications are noted. The soft tissues are otherwise unremarkable. IMPRESSION: 1.  Severe osteoarthritis of the right hip. Mild degenerative change about the left hip. 2. Otherwise unremarkable appearance of the right femur. Electronically Signed   By: Lesia Hausen M.D.   On: 11/25/2022 09:44    Procedures Procedures    Medications Ordered in ED Medications  acetaminophen (TYLENOL) tablet 650 mg (650 mg Oral Given 11/25/22 0844)    ED Course/ Medical Decision Making/ A&P                             Medical Decision Making Problems Addressed: Elevated blood pressure reading: acute illness or injury Essential hypertension: chronic illness or injury with exacerbation, progression, or side effects of treatment that poses a threat to life or bodily functions Primary osteoarthritis of right hip: chronic illness or injury that poses a threat to life or bodily functions Right hip pain: acute illness or injury  Amount and/or Complexity of Data Reviewed Independent Historian: spouse    Details: hx External Data Reviewed: notes. Radiology: ordered and independent interpretation performed. Decision-making details documented in ED Course.  Risk OTC drugs.   Reviewed nursing notes and prior charts for additional history.   Denies meds pta.   Imaging ordered.  Acetaminophen po.  Xrays reviewed/interpreted by me  - no fx. +degen changes.   Pt comfortable, no distress.   Pt currently appears stable for d/c. Rec pcp f/u. Return precautions provided.         Final Clinical Impression(s) / ED Diagnoses Final diagnoses:  Right hip pain  Elevated blood pressure reading  Essential hypertension    Rx / DC Orders ED Discharge Orders     None         Cathren Laine, MD 11/25/22 [phone]

## 2022-11-25 NOTE — Discharge Instructions (Addendum)
It was our pleasure to provide your ER care today - we hope that you feel better.  Your xrays show no acute fracture. Note is made of significant arthritis and degenerative changes in the right hip.   Take acetaminophen or ibuprofen as need.  Follow up with primary care doctor/orthopedist in 1-2 weeks. Also have your blood pressure rechecked then as it is high today.  Return to ER if worse, new symptoms, fevers, severe pain, new numbness/weakness, increased swelling/redness to area, or other concern.

## 2022-12-02 ENCOUNTER — Encounter: Payer: Self-pay | Admitting: Orthopaedic Surgery

## 2022-12-02 ENCOUNTER — Ambulatory Visit (INDEPENDENT_AMBULATORY_CARE_PROVIDER_SITE_OTHER): Payer: Medicare HMO | Admitting: Orthopaedic Surgery

## 2022-12-02 VITALS — BP 155/84 | HR 65 | Ht 71.0 in | Wt 175.4 lb

## 2022-12-02 DIAGNOSIS — M1611 Unilateral primary osteoarthritis, right hip: Secondary | ICD-10-CM

## 2022-12-02 NOTE — Patient Instructions (Addendum)
We would like to refer you to [facility] from [facility] address is [address] [place] The phone number is [phone]  If you change your mind let us know we can refer you  Dr. Magnus Ivan Could see you to discuss a hip replacement  Get some tennis balls for your walker

## 2022-12-02 NOTE — Progress Notes (Signed)
Subjective:    Patient ID: Javier [name], male    DOB: June 28, 1945, 77 y.o.   MRN: [id number]  HPI He has long history of right hip pain.  He is a poor historian and difficult to talk to.  He has had a stroke in the past about two years ago which affects his memory.  He was seen in the ER 11-25-22 for hip pain.  He has severe arthritis of the right hip.  He has pain.  He uses a walker.  He denies any trauma.  I have reviewed the ER records and X-rays.  I have independently reviewed and interpreted x-rays of this patient done at another site by another physician or qualified health professional.    Review of Systems  Constitutional:  Positive for activity change.  Musculoskeletal:  Positive for arthralgias, gait problem and myalgias.  Neurological:        History of stroke in past affecting memory.  For Review of Systems, all other systems reviewed and are negative.  The following is a summary of the past history medically, past history surgically, known current medicines, social history and family history.  This information is gathered electronically by the computer from prior information and documentation.  I review this each visit and have found including this information at this point in the chart is beneficial and informative.   Past Medical History:  Diagnosis Date   Arthritis    Cataract    Diverticulosis    History of vitamin D deficiency    HLD (hyperlipidemia)    Hypertension    PAD (peripheral artery disease) (HCC)    Peripheral neuropathy    Stroke [facility])    SVT (supraventricular tachycardia)     Past Surgical History:  Procedure Laterality Date   COLONOSCOPY  11/19/2009   RMR: 1. Multiple small rectal polyps status post snare polypectomy. 2. Multiple left colon polyp status post snare removal . Poor prep compromised exam.    COLONOSCOPY N/A 12/13/2014   Procedure: COLONOSCOPY;  Surgeon: Corbin Ade, MD;  Location: [facility];  Service: Endoscopy;   Laterality: N/A;  945   COLONOSCOPY N/A 07/18/2020   Procedure: COLONOSCOPY;  Surgeon: Corbin Ade, MD;  Location: [facility];  Service: Endoscopy;  Laterality: N/A;  am   FLEXIBLE SIGMOIDOSCOPY N/A 04/25/2020   Procedure: FLEXIBLE SIGMOIDOSCOPY;  Surgeon: Corbin Ade, MD; incomplete colonoscopy with flex sig only due to poor prep.   HERNIA REPAIR     POLYPECTOMY  07/18/2020   Procedure: POLYPECTOMY;  Surgeon: Corbin Ade, MD;  Location: [facility];  Service: Endoscopy;;    Current Outpatient Medications on File Prior to Visit  Medication Sig Dispense Refill   acetaminophen (TYLENOL) 325 MG tablet Take 1-2 tablets (325-650 mg total) by mouth every 4 (four) hours as needed for mild pain.     amiodarone (PACERONE) 200 MG tablet Take 1 tablet by mouth twice daily for 14 days, then decrease to 1 tablet once daily. 42 tablet 0   aspirin EC 81 MG tablet Take 1 tablet (81 mg total) by mouth daily. Swallow whole. 150 tablet 2   irbesartan (AVAPRO) 75 MG tablet Take 0.5 tablets (37.5 mg total) by mouth daily.     methocarbamol (ROBAXIN) 500 MG tablet Take 1 tablet (500 mg total) by mouth every 6 (six) hours as needed for muscle spasms.     metoprolol succinate (TOPROL-XL) 25 MG 24 hr tablet Take 1 tablet (25 mg total)  by mouth daily. Take with or immediately following a meal.     pantoprazole (PROTONIX) 40 MG tablet Take 1 tablet (40 mg total) by mouth 2 (two) times daily. 60 tablet 2   rosuvastatin (CRESTOR) 20 MG tablet Take 20 mg by mouth daily.      No current facility-administered medications on file prior to visit.    Social History   Socioeconomic History   Marital status: Married    Spouse name: Tonna Boehringer   Number of children: Not on file   Years of education: Not on file   Highest education level: Not on file  Occupational History   Not on file  Tobacco Use   Smoking status: Former   Smokeless tobacco: Never  Vaping Use   Vaping status: Never Used  Substance and  Sexual Activity   Alcohol use: Yes    Comment: Gin - twice per week; no Gin currently. Beer occasionally.    Drug use: No   Sexual activity: Not on file  Other Topics Concern   Not on file  Social History Narrative   Lives with wife   Social Determinants of Health   Financial Resource Strain: Not on file  Food Insecurity: Not on file  Transportation Needs: Not on file  Physical Activity: Not on file  Stress: Not on file  Social Connections: Not on file  Intimate Partner Violence: Not on file    Family History  Problem Relation Age of Onset   Stroke Mother    Stroke Father    Heart attack Father    Colon cancer Neg Hx     BP (!) 155/84   Pulse 65   Ht 5\' 11"  (1.803 m)   Wt 175 lb 6.4 oz (79.6 kg)   BMI 24.46 kg/m   Body mass index is 24.46 kg/m.      Objective:   Physical Exam Vitals and nursing note reviewed. Exam conducted with a chaperone present.  Constitutional:      Appearance: He is well-developed.  HENT:     Head: Normocephalic and atraumatic.  Eyes:     Conjunctiva/sclera: Conjunctivae normal.     Pupils: Pupils are equal, round, and reactive to light.  Cardiovascular:     Rate and Rhythm: Normal rate and regular rhythm.  Pulmonary:     Effort: Pulmonary effort is normal.  Abdominal:     Palpations: Abdomen is soft.  Musculoskeletal:     Cervical back: Normal range of motion and neck supple.       Legs:  Skin:    General: Skin is warm and dry.  Neurological:     Mental Status: He is alert and oriented to person, place, and time.     Cranial Nerves: No cranial nerve deficit.     Motor: No abnormal muscle tone.     Coordination: Coordination normal.     Deep Tendon Reflexes: Reflexes are normal and symmetric. Reflexes normal.  Psychiatric:        Behavior: Behavior normal.        Thought Content: Thought content normal.        Judgment: Judgment normal.           Assessment & Plan:   Encounter Diagnosis  Name Primary?    Unilateral primary osteoarthritis, right hip Yes   He needs a total hip.  I have shown him the X-rays.    He declines any surgery.  He declines to be seen at our other office in [place]  for further evaluation.  He says he will just go on like he is.  I have told him his hip is completely "worn out".  He says he believes that by the way it hurts.  He declines any medicine.  He says he will just go on and on.  He thanked me for my help.  He declines new appointment.  Call if any problem.  I told him he could bring a friend or family member in to talk to them.  He declines.  Precautions discussed.  Electronically Signed Darreld Mclean, MD 7/30/20248:58 AM

## 2023-02-09 ENCOUNTER — Other Ambulatory Visit (HOSPITAL_COMMUNITY)
Admission: RE | Admit: 2023-02-09 | Discharge: 2023-02-09 | Disposition: A | Discharge status: Home / Self Care | Payer: Medicare HMO | Source: Ambulatory Visit | Attending: Family Medicine | Admitting: Family Medicine

## 2023-02-09 DIAGNOSIS — I10 Essential (primary) hypertension: Secondary | ICD-10-CM | POA: Diagnosis present

## 2023-02-09 DIAGNOSIS — E785 Hyperlipidemia, unspecified: Secondary | ICD-10-CM | POA: Insufficient documentation

## 2023-02-09 DIAGNOSIS — R7303 Prediabetes: Secondary | ICD-10-CM | POA: Insufficient documentation

## 2023-02-09 LAB — URINALYSIS, COMPLETE (UACMP) WITH MICROSCOPIC
Bilirubin Urine: NEGATIVE
Glucose, UA: NEGATIVE mg/dL
Ketones, ur: NEGATIVE mg/dL
Nitrite: NEGATIVE
Protein, ur: 30 mg/dL — AB
Specific Gravity, Urine: 1.017 (ref 1.005–1.030)
pH: 6 (ref 5.0–8.0)

## 2023-02-09 LAB — CBC WITH DIFFERENTIAL/PLATELET
Abs Immature Granulocytes: 0.02 10*3/uL (ref 0.00–0.07)
Basophils Absolute: 0 10*3/uL (ref 0.0–0.1)
Basophils Relative: 1 %
Eosinophils Absolute: 0.2 10*3/uL (ref 0.0–0.5)
Eosinophils Relative: 3 %
HCT: 38.2 % — ABNORMAL LOW (ref 39.0–52.0)
Hemoglobin: 12.5 g/dL — ABNORMAL LOW (ref 13.0–17.0)
Immature Granulocytes: 0 %
Lymphocytes Relative: 22 %
Lymphs Abs: 1.5 10*3/uL (ref 0.7–4.0)
MCH: 29 pg (ref 26.0–34.0)
MCHC: 32.7 g/dL (ref 30.0–36.0)
MCV: 88.6 fL (ref 80.0–100.0)
Monocytes Absolute: 0.6 10*3/uL (ref 0.1–1.0)
Monocytes Relative: 9 %
Neutro Abs: 4.2 10*3/uL (ref 1.7–7.7)
Neutrophils Relative %: 65 %
Platelets: 184 10*3/uL (ref 150–400)
RBC: 4.31 MIL/uL (ref 4.22–5.81)
RDW: 14.1 % (ref 11.5–15.5)
WBC: 6.5 10*3/uL (ref 4.0–10.5)
nRBC: 0 % (ref 0.0–0.2)

## 2023-02-09 LAB — HEMOGLOBIN A1C
Hgb A1c MFr Bld: 6.2 % — ABNORMAL HIGH (ref 4.8–5.6)
Mean Plasma Glucose: 131.24 mg/dL

## 2023-02-09 LAB — COMPREHENSIVE METABOLIC PANEL
ALT: 13 U/L (ref 0–44)
AST: 21 U/L (ref 15–41)
Albumin: 4 g/dL (ref 3.5–5.0)
Alkaline Phosphatase: 56 U/L (ref 38–126)
Anion gap: 10 (ref 5–15)
BUN: 23 mg/dL (ref 8–23)
CO2: 25 mmol/L (ref 22–32)
Calcium: 8.8 mg/dL — ABNORMAL LOW (ref 8.9–10.3)
Chloride: 101 mmol/L (ref 98–111)
Creatinine, Ser: 1.05 mg/dL (ref 0.61–1.24)
GFR, Estimated: >60 mL/min (ref >60)
Glucose, Bld: 105 mg/dL — ABNORMAL HIGH (ref 70–99)
Potassium: 3.1 mmol/L — ABNORMAL LOW (ref 3.5–5.1)
Sodium: 136 mmol/L (ref 135–145)
Total Bilirubin: 0.4 mg/dL (ref 0.3–1.2)
Total Protein: 8.2 g/dL — ABNORMAL HIGH (ref 6.5–8.1)

## 2023-02-09 LAB — LIPID PANEL
Cholesterol: 143 mg/dL (ref 0–200)
HDL: 58 mg/dL (ref >40)
LDL Cholesterol: 66 mg/dL (ref 0–99)
Total CHOL/HDL Ratio: 2.5 {ratio}
Triglycerides: 97 mg/dL (ref <150)
VLDL: 19 mg/dL (ref 0–40)

## 2023-06-09 ENCOUNTER — Encounter (INDEPENDENT_AMBULATORY_CARE_PROVIDER_SITE_OTHER): Payer: Self-pay | Admitting: *Deleted

## 2023-09-01 ENCOUNTER — Encounter (HOSPITAL_COMMUNITY): Payer: Self-pay | Admitting: *Deleted

## 2023-09-01 ENCOUNTER — Other Ambulatory Visit: Payer: Self-pay

## 2023-09-01 ENCOUNTER — Emergency Department (HOSPITAL_COMMUNITY)
Admission: EM | Admit: 2023-09-01 | Discharge: 2023-09-01 | Disposition: A | Discharge status: Home / Self Care | Attending: Emergency Medicine | Admitting: Emergency Medicine

## 2023-09-01 ENCOUNTER — Emergency Department (HOSPITAL_COMMUNITY)

## 2023-09-01 DIAGNOSIS — R051 Acute cough: Secondary | ICD-10-CM

## 2023-09-01 DIAGNOSIS — M160 Bilateral primary osteoarthritis of hip: Secondary | ICD-10-CM

## 2023-09-01 DIAGNOSIS — M25552 Pain in left hip: Secondary | ICD-10-CM | POA: Diagnosis present

## 2023-09-01 DIAGNOSIS — M169 Osteoarthritis of hip, unspecified: Secondary | ICD-10-CM | POA: Insufficient documentation

## 2023-09-01 DIAGNOSIS — R059 Cough, unspecified: Secondary | ICD-10-CM | POA: Insufficient documentation

## 2023-09-01 MED ORDER — BENZONATATE 100 MG PO CAPS: 100.0000 mg | ORAL_CAPSULE | Freq: Three times a day (TID) | ORAL | 0 refills | Status: AC

## 2023-09-01 MED ORDER — PREDNISONE 10 MG PO TABS: 40.0000 mg | ORAL_TABLET | Freq: Every day | ORAL | 0 refills | Status: AC

## 2023-09-01 NOTE — ED Triage Notes (Signed)
 Pt c/o bilateral leg pain with worse pain on the left that the right;  Pt denies any fall

## 2023-09-01 NOTE — Discharge Instructions (Signed)
 Please follow-up closely with your primary care doctor and with orthopedics on an outpatient basis.  Return to emergency department immediately for any new or worsening symptoms.

## 2023-09-01 NOTE — ED Provider Notes (Signed)
 [place] EMERGENCY DEPARTMENT AT [facility] Provider Note   CSN: [id number] Arrival date & time: 09/01/23  8657     History  Chief Complaint  Patient presents with   Leg Pain    Javier [name] is a 78 y.o. male.  Patient is a 78 year old male who presents to the emergency department with a chief complaint of left hip pain with radiation down his left leg.  Patient notes that symptoms have been ongoing for approximate the past 2 days.  Patient notes he does have chronic pain in his right leg.  Of note patient has been previously seen by orthopedics and directed to have a hip replacement though he has declined.  Patient currently denies any associated edema, pain to back, abdominal pain.  He denies any numbness or paresthesias.  He does note that the pain is worse with ambulation.  Patient also notes that he has been experiencing a productive cough.   Leg Pain      Home Medications Prior to Admission medications   Medication Sig Start Date End Date Taking? Authorizing Provider  acetaminophen  (TYLENOL ) 325 MG tablet Take 1-2 tablets (325-650 mg total) by mouth every 4 (four) hours as needed for mild pain. 10/09/21   Angiulli, Daniel J, PA-C  amiodarone  (PACERONE ) 200 MG tablet Take 1 tablet by mouth twice daily for 14 days, then decrease to 1 tablet once daily. 10/15/21   Haydee Lipa, MD  irbesartan  (AVAPRO ) 75 MG tablet Take 0.5 tablets (37.5 mg total) by mouth daily. 10/10/21   Angiulli, Everlyn Hockey, PA-C  methocarbamol  (ROBAXIN ) 500 MG tablet Take 1 tablet (500 mg total) by mouth every 6 (six) hours as needed for muscle spasms. 10/09/21   Angiulli, Everlyn Hockey, PA-C  metoprolol  succinate (TOPROL -XL) 25 MG 24 hr tablet Take 1 tablet (25 mg total) by mouth daily. Take with or immediately following a meal. 10/12/21   Wynetta Heckle, MD  pantoprazole  (PROTONIX ) 40 MG tablet Take 1 tablet (40 mg total) by mouth 2 (two) times daily. 09/26/21   Justina Oman, MD  rosuvastatin   (CRESTOR ) 20 MG tablet Take 20 mg by mouth daily.     [provider]      Allergies    Patient has no known allergies.    Review of Systems   Review of Systems  Respiratory:  Positive for cough.   Musculoskeletal:        Pain to left hip  All other systems reviewed and are negative.   Physical Exam Updated Vital Signs BP (!) 152/81   Pulse (!) 59   Temp 98.1 F (36.7 C) (Oral)   Ht 5\' 11"  (1.803 m)   Wt 79.4 kg   SpO2 95%   BMI 24.41 kg/m  Physical Exam Vitals and nursing note reviewed.  Constitutional:      Appearance: Normal appearance.  HENT:     Head: Normocephalic and atraumatic.     Nose: Nose normal.     Mouth/Throat:     Mouth: Mucous membranes are moist.  Eyes:     Extraocular Movements: Extraocular movements intact.     Conjunctiva/sclera: Conjunctivae normal.     Pupils: Pupils are equal, round, and reactive to light.  Cardiovascular:     Rate and Rhythm: Normal rate and regular rhythm.     Pulses: Normal pulses.     Heart sounds: Normal heart sounds.  Pulmonary:     Effort: Pulmonary effort is normal. No respiratory distress.  Breath sounds: Normal breath sounds. No stridor. No wheezing, rhonchi or rales.  Abdominal:     General: Abdomen is flat. Bowel sounds are normal. There is no distension.     Palpations: Abdomen is soft.     Tenderness: There is no abdominal tenderness. There is no guarding.  Musculoskeletal:        General: No swelling, tenderness, deformity or signs of injury. Normal range of motion.     Cervical back: Normal range of motion and neck supple.     Right lower leg: No edema.     Left lower leg: No edema.     Comments: DP and PT pulses 2+ in bilateral lower extremities, no overlying edema, no overlying erythema or warmth of the left hip, full range of motion noted throughout  Skin:    General: Skin is warm and dry.  Neurological:     General: No focal deficit present.     Mental Status: He is alert and oriented  to person, place, and time. Mental status is at baseline.  Psychiatric:        Mood and Affect: Mood normal.        Behavior: Behavior normal.        Thought Content: Thought content normal.        Judgment: Judgment normal.     ED Results / Procedures / Treatments   Labs (all labs ordered are listed, but only abnormal results are displayed) Labs Reviewed - No data to display  EKG None  Radiology No results found.  Procedures Procedures    Medications Ordered in ED Medications - No data to display  ED Course/ Medical Decision Making/ A&P                                 Medical Decision Making Amount and/or Complexity of Data Reviewed Radiology: ordered.  Risk Prescription drug management.   This patient presents to the ED for concern of left hip pain and cough differential diagnosis includes fracture, sprain, muscle spasm, arthritis, acute viral syndrome, pneumonia    Additional history obtained:  Additional history obtained from none External records from outside source obtained and reviewed including medical records    Imaging Studies ordered:  I ordered imaging studies including x-ray of left hip, x-ray of chest I independently visualized and interpreted imaging which showed arthritic changes to bilateral hips, no acute cardiopulmonary process I agree with the radiologist interpretation   Medicines ordered and prescription drug management:  I ordered medication including prednisone, Tessalon  for arthritis, cough Reevaluation of the patient after these medicines showed that the patient improved I have reviewed the patients home medicines and have made adjustments as needed   Problem List / ED Course:  Patient is doing well at this time and is stable for discharge.  Discussed with patient that x-rays are consistent with osteoarthritis.  Suspect that this is the cause of his hip pain at this time.  Chest x-ray was otherwise unremarkable.  Will  continue symptomatic treatment for his cough.  Will provide additional treatment to help with his osteoarthritis and discussed with patient importance of close follow-up with orthopedics.  He has already been evaluated by orthopedics and they did recommend hip replacement though he did decline at that time.  Do not Spectamine etiology such as DVT.  He has no clinical indication for acute arterial occlusion.  There is no indication for septic joint or  gout.  Strict return precautions were provided for any new or worsening symptoms.  Patient voiced understanding and had no additional questions.  Social Determinants of Health:  None           Final Clinical Impression(s) / ED Diagnoses Final diagnoses:  None    Rx / DC Orders ED Discharge Orders     None         Emmalene Hare 09/01/23 1113    Tonya Fredrickson, MD 09/01/23 1714

## 2024-02-18 ENCOUNTER — Other Ambulatory Visit: Payer: Self-pay

## 2024-02-18 ENCOUNTER — Encounter (HOSPITAL_COMMUNITY): Payer: Self-pay

## 2024-02-18 ENCOUNTER — Emergency Department (HOSPITAL_COMMUNITY)
Admission: EM | Admit: 2024-02-18 | Discharge: 2024-02-18 | Disposition: A | Discharge status: Home / Self Care | Attending: Emergency Medicine | Admitting: Emergency Medicine

## 2024-02-18 DIAGNOSIS — Z79899 Other long term (current) drug therapy: Secondary | ICD-10-CM | POA: Insufficient documentation

## 2024-02-18 DIAGNOSIS — T63091A Toxic effect of venom of other snake, accidental (unintentional), initial encounter: Secondary | ICD-10-CM | POA: Diagnosis present

## 2024-02-18 DIAGNOSIS — W5911XA Bitten by nonvenomous snake, initial encounter: Secondary | ICD-10-CM

## 2024-02-18 HISTORY — DX: Unspecified dementia, unspecified severity, without behavioral disturbance, psychotic disturbance, mood disturbance, and anxiety: F03.90

## 2024-02-18 HISTORY — DX: Anemia, unspecified: D64.9

## 2024-02-18 HISTORY — DX: Prediabetes: R73.03

## 2024-02-18 HISTORY — DX: Dementia in other diseases classified elsewhere, unspecified severity, without behavioral disturbance, psychotic disturbance, mood disturbance, and anxiety: F02.80

## 2024-02-18 MED ORDER — TETANUS-DIPHTH-ACELL PERTUSSIS 5-2-15.5 LF-MCG/0.5 IM SUSP
0.5000 mL | Freq: Once | INTRAMUSCULAR | Status: DC
  Filled 2024-02-18: qty 0.5

## 2024-02-18 NOTE — ED Provider Notes (Signed)
 [place] EMERGENCY DEPARTMENT AT [facility] Provider Note   CSN: [id number] Arrival date & time: 02/18/24  1208     Patient presents with: Snake Bite   Javier [name] is a 78 y.o. male.   78 year old male who presents emergency department with snake bite.  Patient reports that he was bit by a black rat snake on his right arm twice today in his house.  Has not had any significant swelling or redness.  No significant pain.  Last Tdap was over 10 years ago.  States he was bit by a snake in the past and had significant swelling and pain and had to come into the hospital so he wanted to be evaluated       Prior to Admission medications   Medication Sig Start Date End Date Taking? Authorizing Provider  acetaminophen  (TYLENOL ) 325 MG tablet Take 1-2 tablets (325-650 mg total) by mouth every 4 (four) hours as needed for mild pain. 10/09/21   Angiulli, Daniel J, PA-C  amiodarone  (PACERONE ) 200 MG tablet Take 1 tablet by mouth twice daily for 14 days, then decrease to 1 tablet once daily. 10/15/21   Lue Elsie BROCKS, MD  benzonatate  (TESSALON ) 100 MG capsule Take 1 capsule (100 mg total) by mouth every 8 (eight) hours. 09/01/23   Daralene Lonni BIRCH, PA-C  irbesartan  (AVAPRO ) 75 MG tablet Take 0.5 tablets (37.5 mg total) by mouth daily. 10/10/21   Angiulli, Toribio PARAS, PA-C  methocarbamol  (ROBAXIN ) 500 MG tablet Take 1 tablet (500 mg total) by mouth every 6 (six) hours as needed for muscle spasms. 10/09/21   Angiulli, Toribio PARAS, PA-C  metoprolol  succinate (TOPROL -XL) 25 MG 24 hr tablet Take 1 tablet (25 mg total) by mouth daily. Take with or immediately following a meal. 10/12/21   Bryn Bernardino NOVAK, MD  pantoprazole  (PROTONIX ) 40 MG tablet Take 1 tablet (40 mg total) by mouth 2 (two) times daily. 09/26/21   Ricky Fines, MD  rosuvastatin  (CRESTOR ) 20 MG tablet Take 20 mg by mouth daily.     [provider]    Allergies: Patient has no known allergies.    Review of  Systems  Updated Vital Signs BP 113/61 (BP Location: Left Arm)   Pulse (!) 59   Temp (!) 97.5 F (36.4 C)   Resp 19   Ht 5' 11 (1.803 m)   Wt 81.6 kg   SpO2 96%   BMI 25.10 kg/m   Physical Exam Musculoskeletal:     Comments: See image below for snakebite to the right forearm.  No erythema or warmth.  No swelling of the arm.  Intact sensation light touch of all dermatomes of the right hand.  Full grip strength and extensor function in the hand.  Cap refill less than 2 seconds in all fingers.  Radial pulse 2+.    Right forearm:   (all labs ordered are listed, but only abnormal results are displayed) Labs Reviewed - No data to display  EKG: None  Radiology: No results found.   Procedures   Medications Ordered in the ED  Tdap (ADACEL) injection 0.5 mL (has no administration in time range)                                    Medical Decision Making Risk Prescription drug management.   78 year old male who presents emergency department snakebite unknown  Initial Ddx:  Snake bite, envenomation,  infection  MDM/Course:  Patient presents emergency department with snake bite on his arm. Non-venomous snake. No significant local reaction. Tdap updated since it has been >10 years from his last shot. Wound cleansed. Will have him perform local wound care at home.  No indication for antivenom or antibiotics at this point in time.   This patient presents to the ED for concern of complaints listed in HPI, this involves an extensive number of treatment options, and is a complaint that carries with it a high risk of complications and morbidity. Disposition including potential need for admission considered.   Dispo: DC Home. Return precautions discussed including, but not limited to, those listed in the AVS. Allowed pt time to ask questions which were answered fully prior to dc.  Records reviewed Outpatient Clinic Notes I have reviewed the patients home medications and made  adjustments as needed Social Determinants of health:  Geriatric  Portions of this note were generated with Scientist, clinical (histocompatibility and immunogenetics). Dictation errors may occur despite best attempts at proofreading.    Final diagnoses:  Snake bite, initial encounter    ED Discharge Orders     None          Yolande Lamar BROCKS, MD 02/18/24 [phone]

## 2024-02-18 NOTE — ED Triage Notes (Signed)
 Pt arrived via POV c/o a black snake bite to his right forearm. Pt reports he was trying to get the snake out of his bedroom, and when he caught it, the snake bit his arm twice.

## 2024-03-08 IMAGING — MR MR MRA HEAD W/O CM
1 series · 48 of 48 positions shown · non-contrast
Comparison: CT from earlier same day

CLINICAL DATA: Dizziness, persistent/recurrent, cardiac or vascular
cause suspected

EXAM:
MRI HEAD WITHOUT CONTRAST
MRA HEAD WITHOUT CONTRAST
TECHNIQUE: Multiplanar, multi-echo pulse sequences of the brain and surrounding
structures were acquired without intravenous contrast. Angiographic
images of the Circle of Willis were acquired using MRA technique
without intravenous contrast.

[Series 100: TOF · axial · 0.5mm · 0.76mm/px · z∈[-64,+14]mm · 48 of 168 slices shown]
[im 1/168]
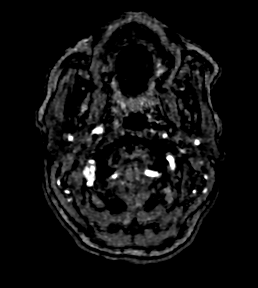
[im 4/168]
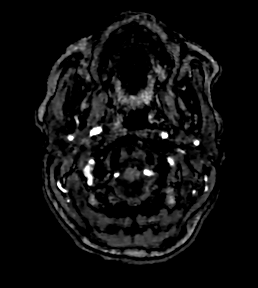
[im 8/168]
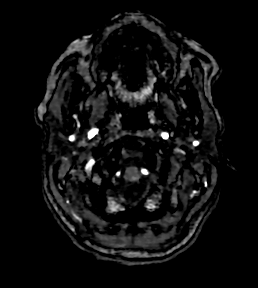
[im 11/168]
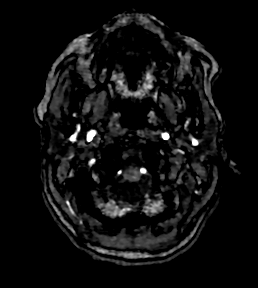
[im 15/168]
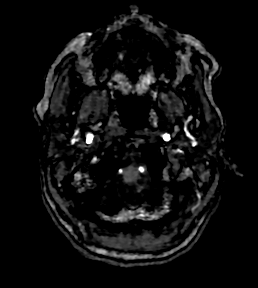
[im 18/168]
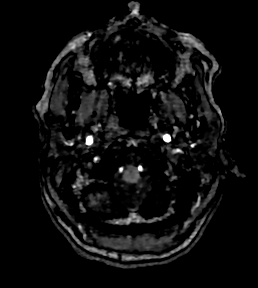
[im 22/168]
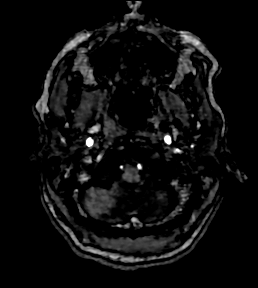
[im 25/168]
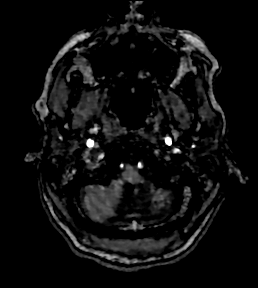
[im 29/168]
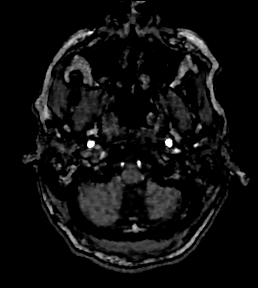
[im 32/168]
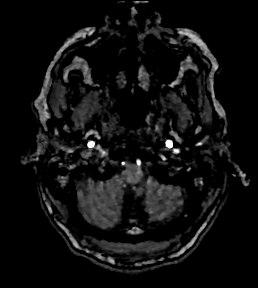
[im 36/168]
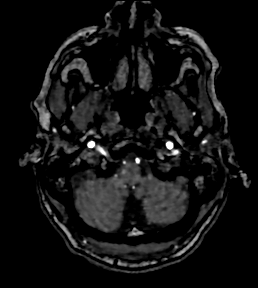
[im 40/168]
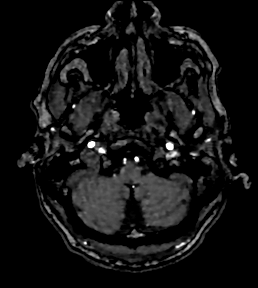
[im 43/168]
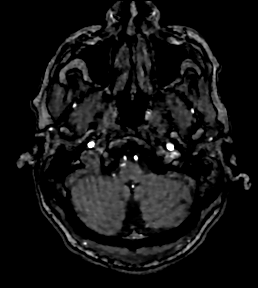
[im 47/168]
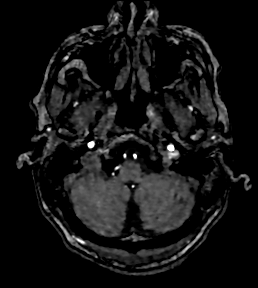
[im 50/168]
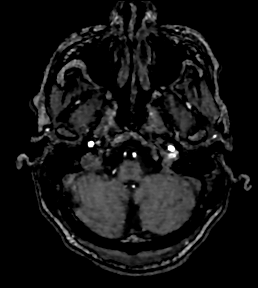
[im 54/168]
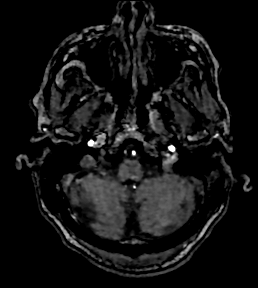
[im 57/168]
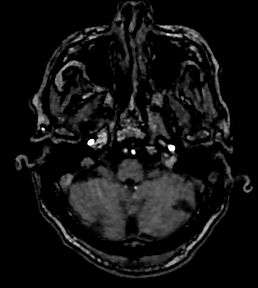
[im 61/168]
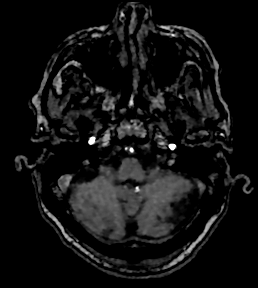
[im 64/168]
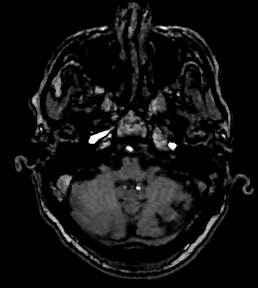
[im 68/168]
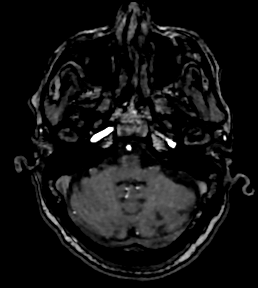
[im 72/168]
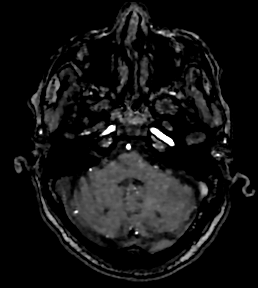
[im 75/168]
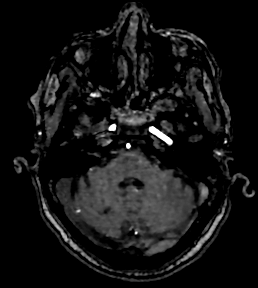
[im 79/168]
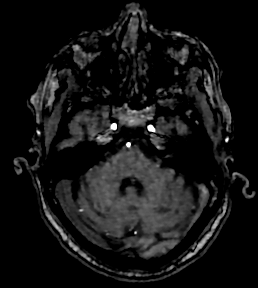
[im 82/168]
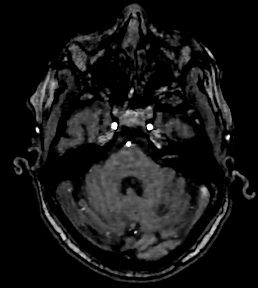
[im 86/168]
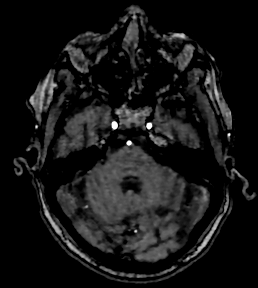
[im 89/168]
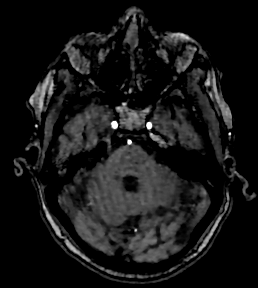
[im 93/168]
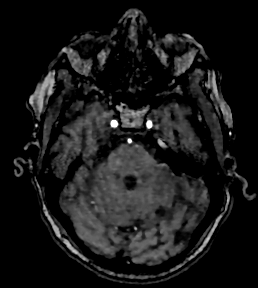
[im 96/168]
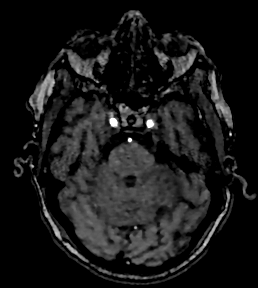
[im 100/168]
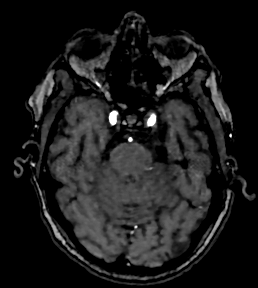
[im 104/168]
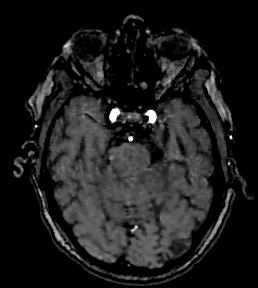
[im 107/168]
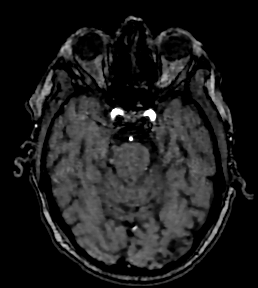
[im 111/168]
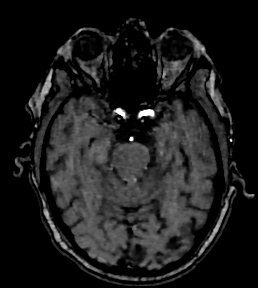
[im 114/168]
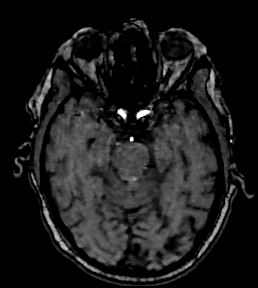
[im 118/168]
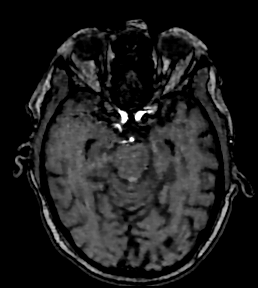
[im 121/168]
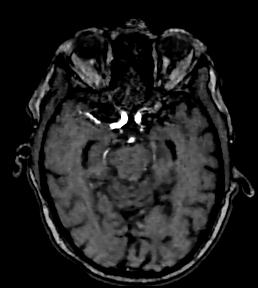
[im 125/168]
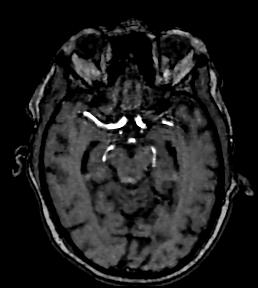
[im 128/168]
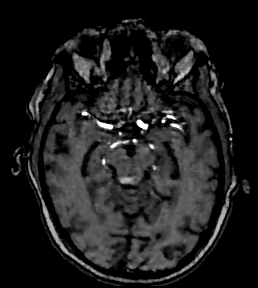
[im 132/168]
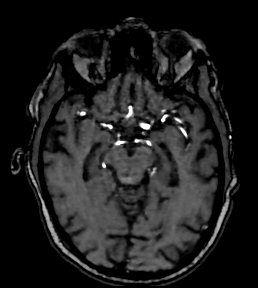
[im 136/168]
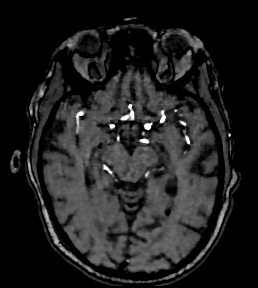
[im 139/168]
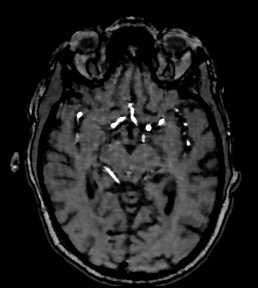
[im 143/168]
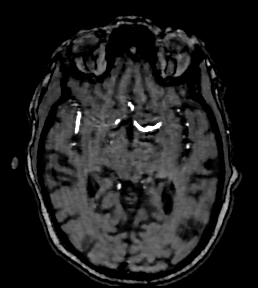
[im 146/168]
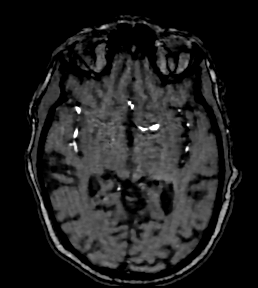
[im 150/168]
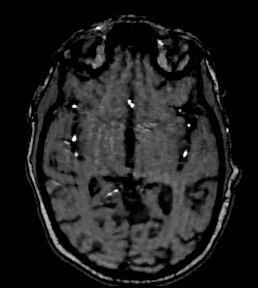
[im 153/168]
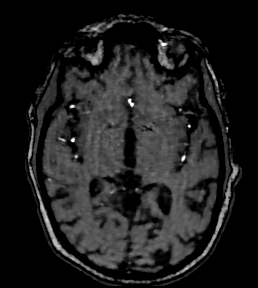
[im 157/168]
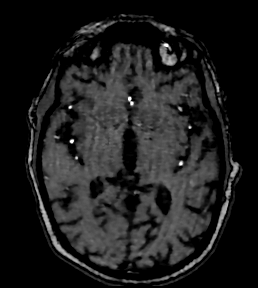
[im 160/168]
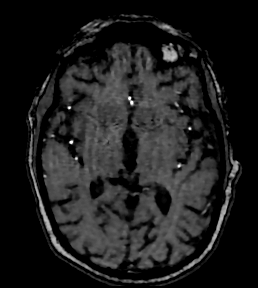
[im 164/168]
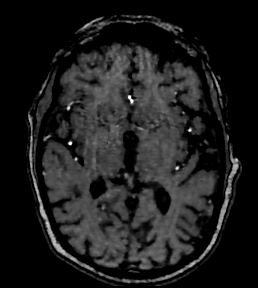
[im 168/168]
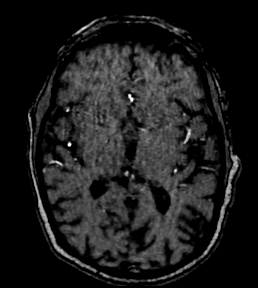

[48 of 48 positions shown; findings below may reference images not displayed]

FINDINGS: MRI HEAD

Patient could not tolerate all sequences.

Brain: There is mildly reduced diffusion in the superior left
cerebellum involving the hemisphere and vermis. There is extension
into the dorsal left midbrain with involvement of the superior
cerebellar peduncle. Additional small area in the inferior left
cerebellum. Two small foci of reduced diffusion in the right
cerebellum.

No evidence of hemorrhage. Additional chronic bilateral cerebellar
and left occipital infarcts. Patchy foci of T2 hyperintensity in the
supratentorial white matter are nonspecific but probably reflect
chronic microvascular ischemic changes. Prominence of the ventricles
and sulci reflects parenchymal volume loss.

There is no intracranial mass or mass effect. No extra-axial
collection. No hydrocephalus.

Vascular: Major vessel flow voids at the skull base are preserved.

Skull and upper cervical spine: Normal marrow signal is preserved.

Sinuses/Orbits: Mild patchy mucosal thickening. Bilateral lens
replacements.

Other: Sella is unremarkable.  Mastoid air cells are clear.

MRA HEAD

Motion artifact is present. Intracranial internal carotid arteries
are patent with atherosclerotic irregularity. Middle and anterior
cerebral arteries are patent. Intracranial vertebral arteries are
patent with atherosclerotic irregularity. Patent PICA origins, more
irregular on the left. Likely patent AICA origins. Right SCA origin
is patent. The left SCA origin is not identified. Bilateral
posterior communicating arteries are present. Posterior cerebral
arteries are patent. Bilateral posterior communicating arteries are
present.
IMPRESSION: Acute infarcts involving the left cerebellum extending into the
dorsal left midbrain. Two small foci of acute infarction in the
right cerebellum. Patent vertebrobasilar arteries. Left SCA may be
occluded.

Chronic bilateral cerebellar and left occipital infarcts. Chronic
microvascular ischemic changes.

## 2024-03-08 IMAGING — CT CT HEAD W/O CM
4 series · 16 of 47 positions shown, 18 images · non-contrast
Comparison: None Available.

CLINICAL DATA: Dizziness, persistent/recurrent with cardiac or
vascular cause suspected.



[Series 2: head w o · axial · 0.48mm/px · z∈[+55,+175]mm · 7 of 34 slices shown, 9 images]
[im 5/34  brain]
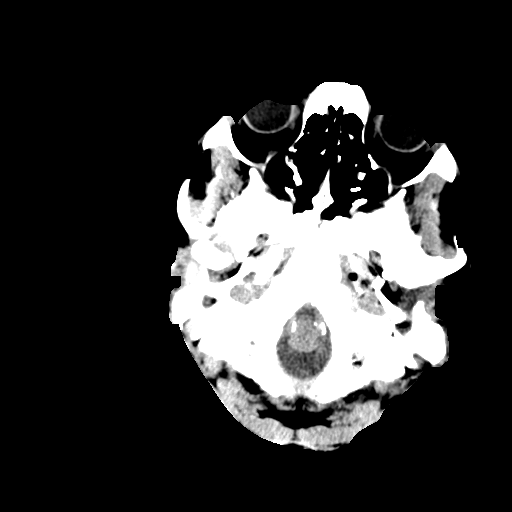
[im 5/34  bone]
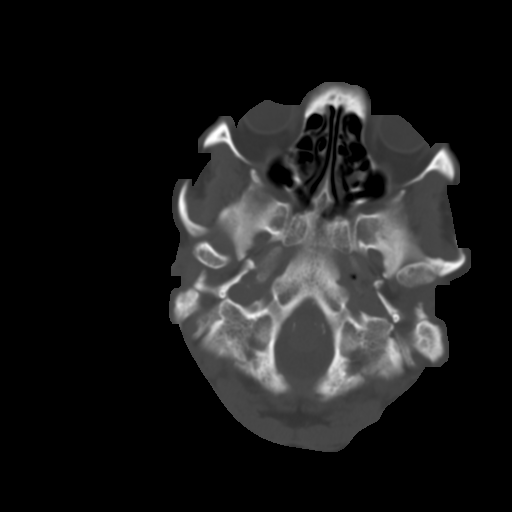
[im 9/34  brain]
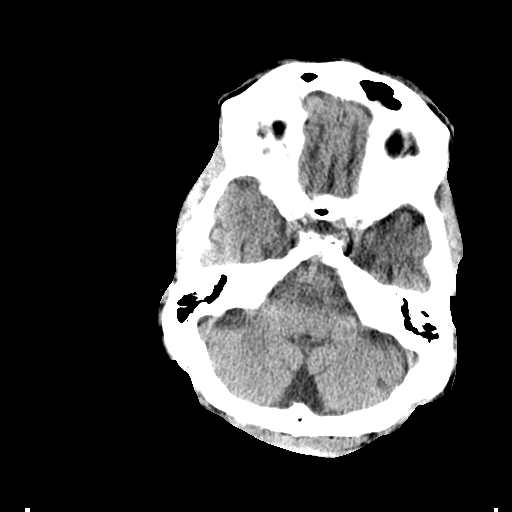
[im 13/34  brain]
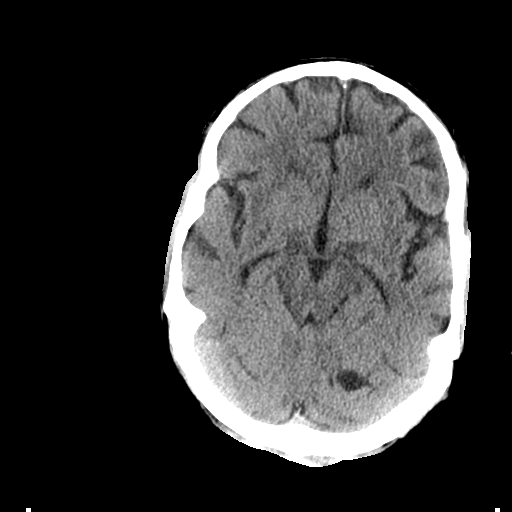
[im 17/34  brain]
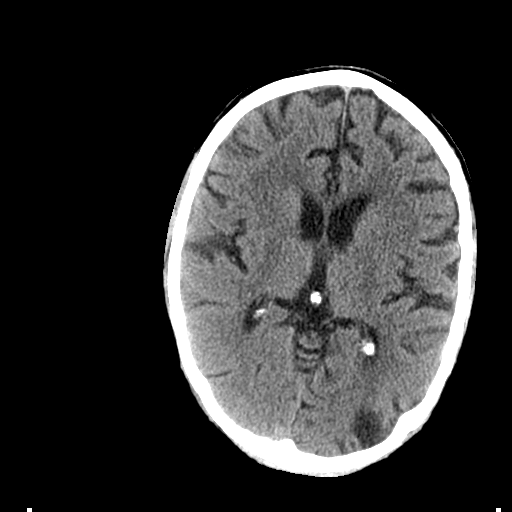
[im 21/34  brain]
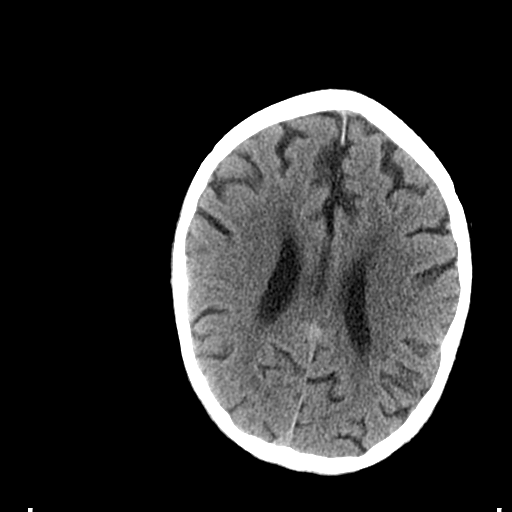
[im 21/34  bone]
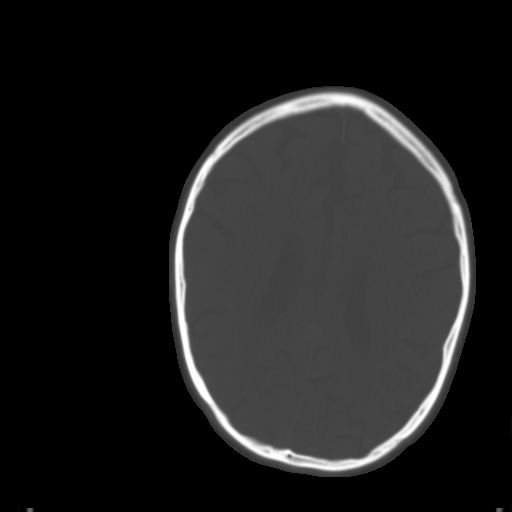
[im 25/34  brain]
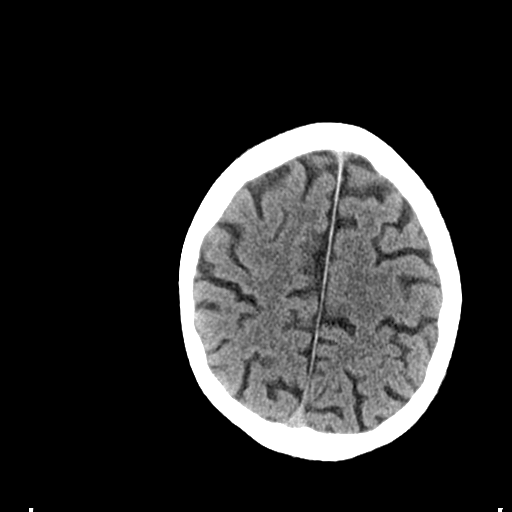
[im 29/34  brain]
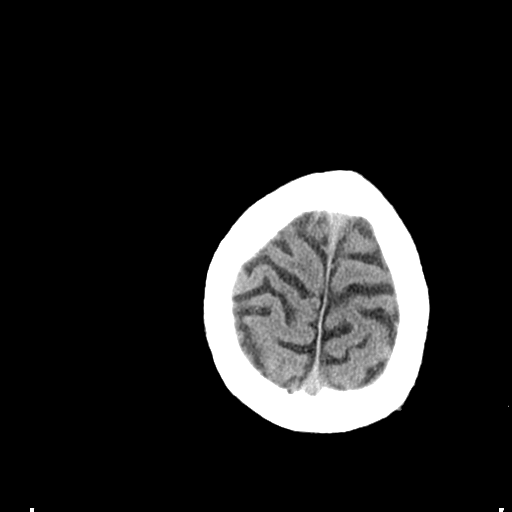

[Series 3: head bone · axial · 0.48mm/px · z∈[+51,+83]mm · 3 of 84 slices shown]
[im 9/84  bone]
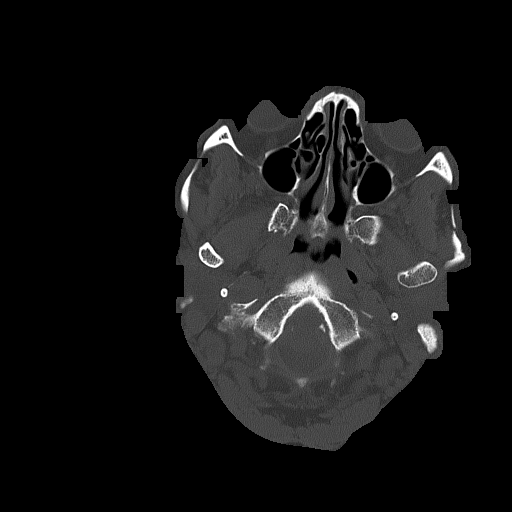
[im 17/84  bone]
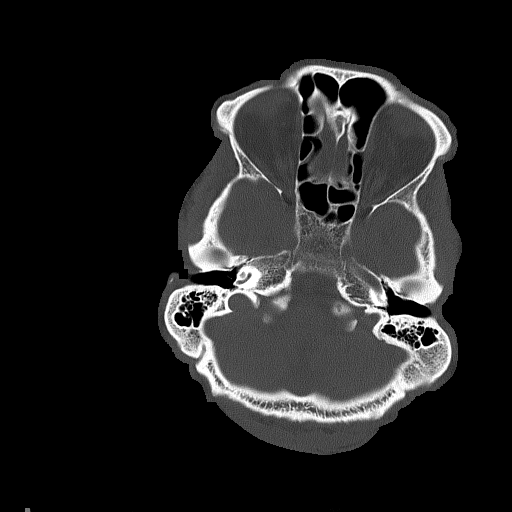
[im 25/84  bone]
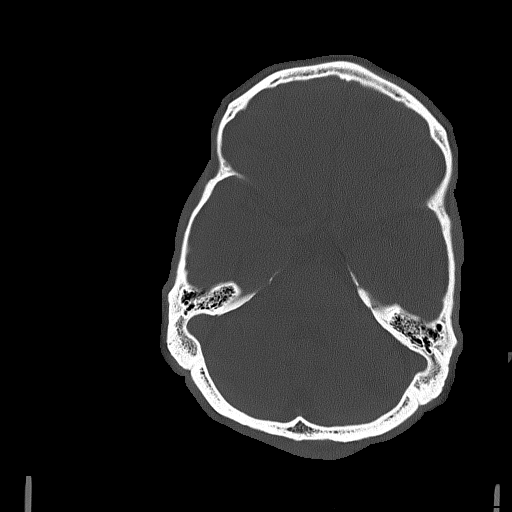

[Series 4: coronal soft · coronal · 0.32mm/px · 3 of 72 slices shown]
[im 24/72  brain]
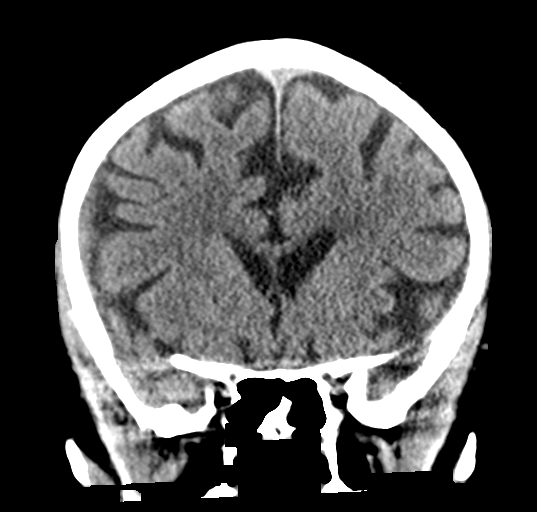
[im 32/72  brain]
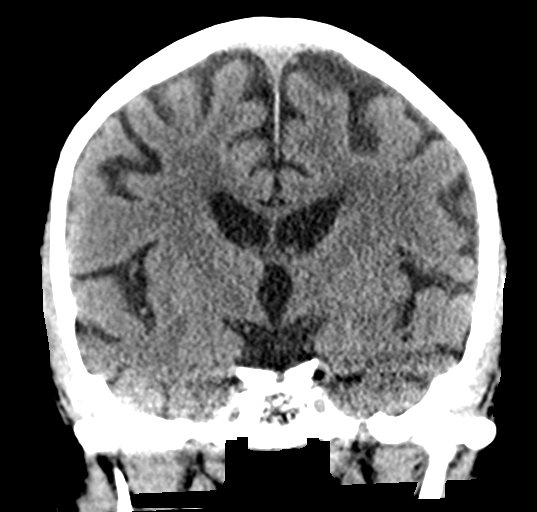
[im 40/72  brain]
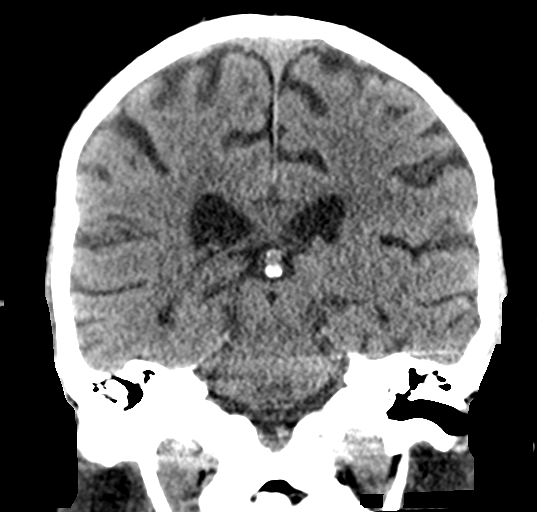

[Series 5: sagittal soft · sagittal · 0.33mm/px · 3 of 55 slices shown]
[im 19/55  brain]
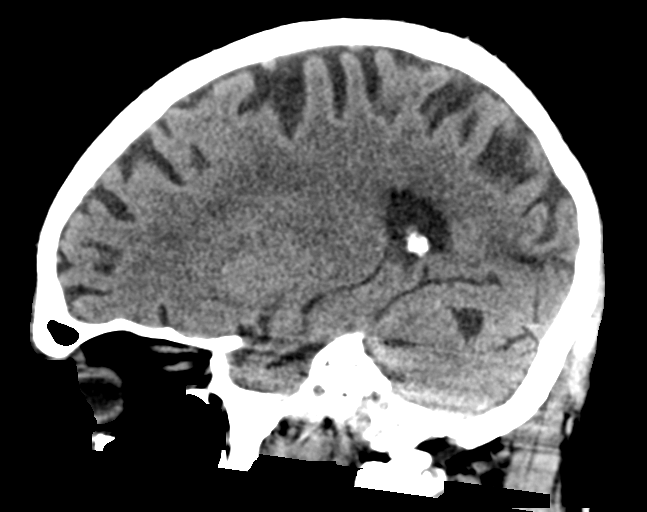
[im 28/55  brain]
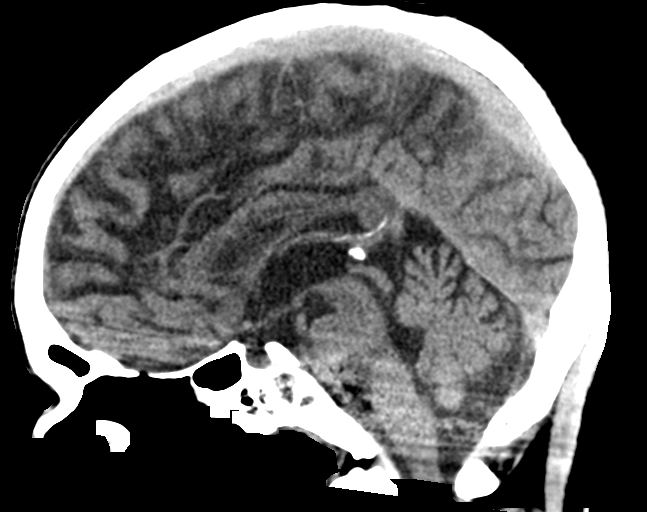
[im 37/55  brain]
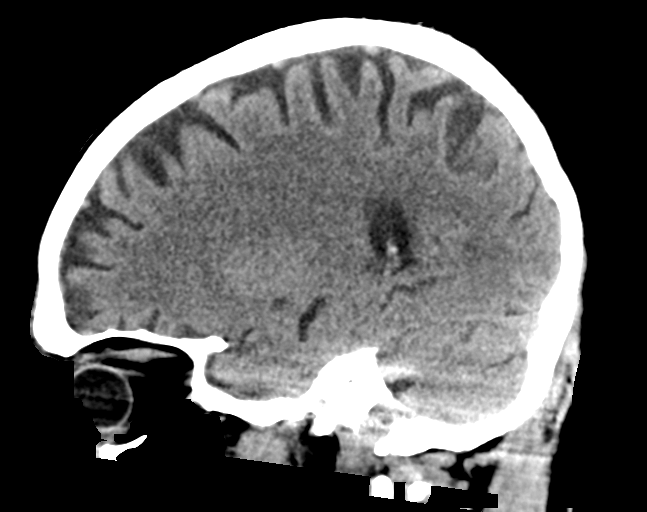

[16 of 47 positions shown; findings below may reference images not displayed]

FINDINGS: Brain: No evidence of acute infarction, hemorrhage, hydrocephalus,
extra-axial collection or mass lesion/mass effect.
Well-defined/chronic appearing infarcts in the left more than right
cerebellum and left occipital cortex.

Vascular: No hyperdense vessel or unexpected calcification.

Skull: Normal. Negative for fracture or focal lesion.

Sinuses/Orbits: No acute finding.
IMPRESSION: 1. No acute finding.
2. Chronic infarcts in the left more than right cerebellum and left
occipital cortex.

## 2024-03-09 IMAGING — US US CAROTID DUPLEX BILAT
1 series · 13 of 24 positions shown · non-contrast
Comparison: Brain MRI-09/25/2021

CLINICAL DATA: Stroke. History of hypertension and hyperlipidemia.
Former smoker.

EXAM:
BILATERAL CAROTID DUPLEX ULTRASOUND
TECHNIQUE: Gray scale imaging, color Doppler and duplex ultrasound were
performed of bilateral carotid and vertebral arteries in the neck.

[Series 1: us carotid bilateral · 13 of 68 slices shown]
[im 1/68]
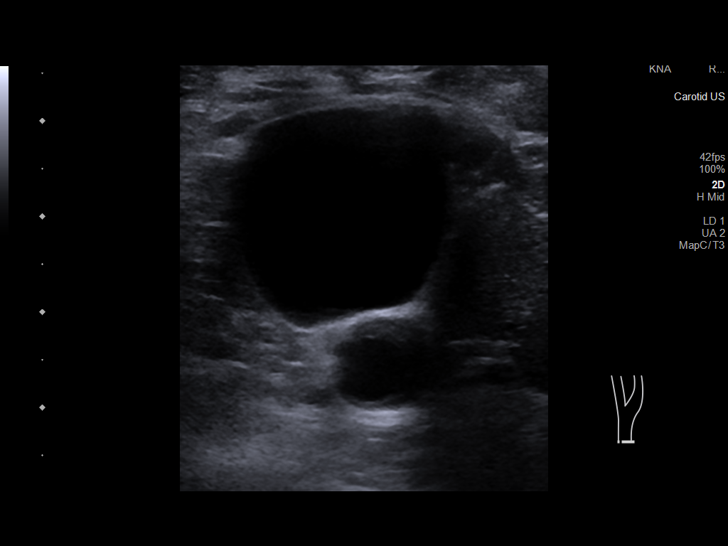
[im 6/68]
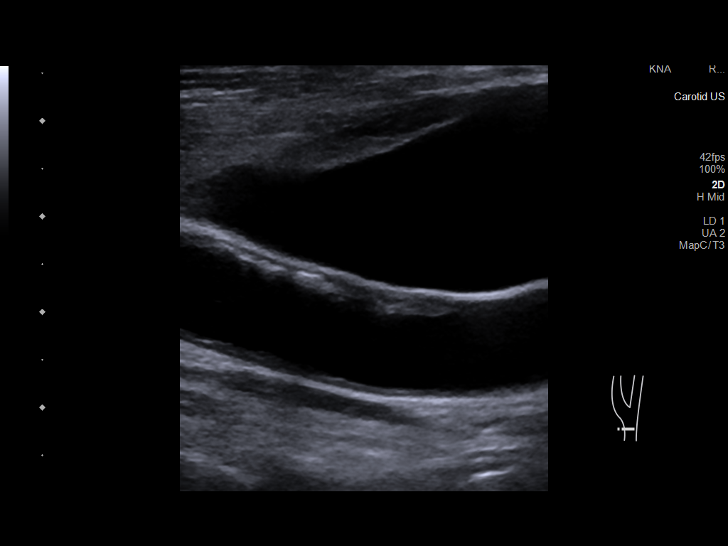
[im 12/68]
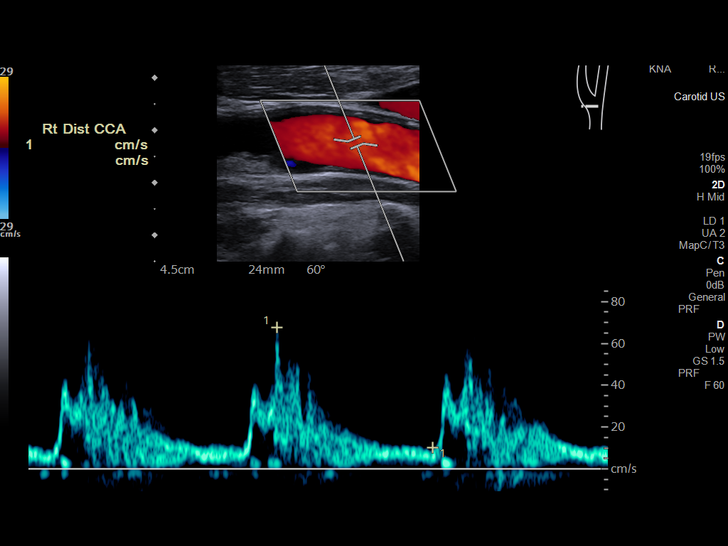
[im 18/68]
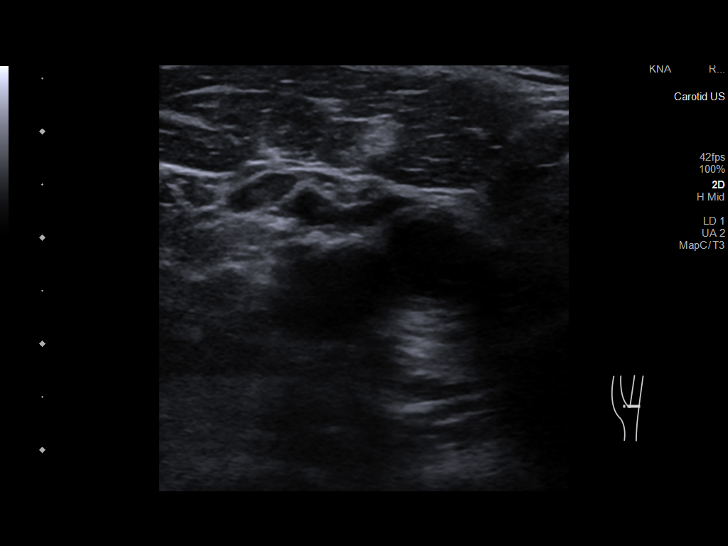
[im 24/68]
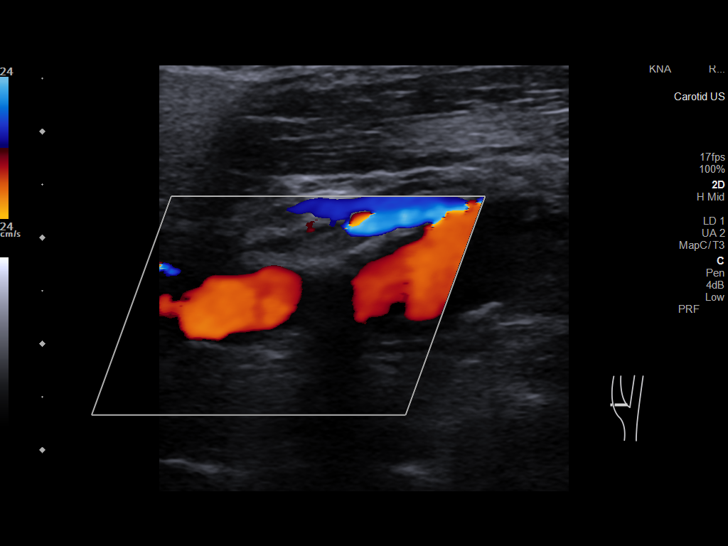
[im 30/68]
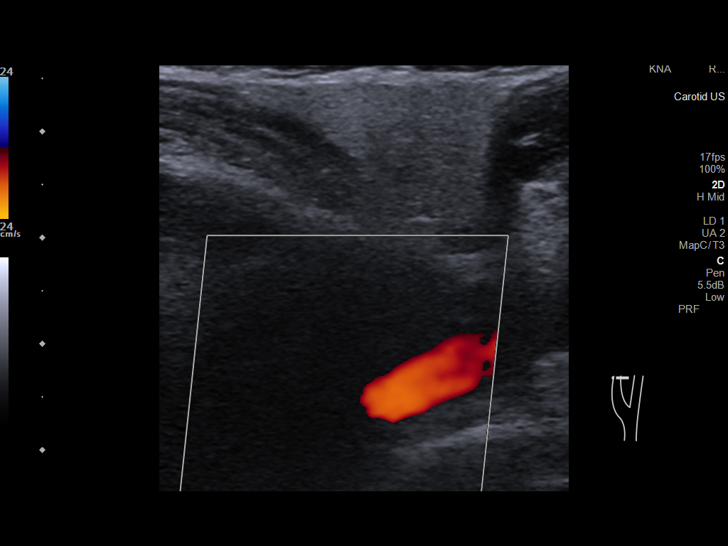
[im 35/68]
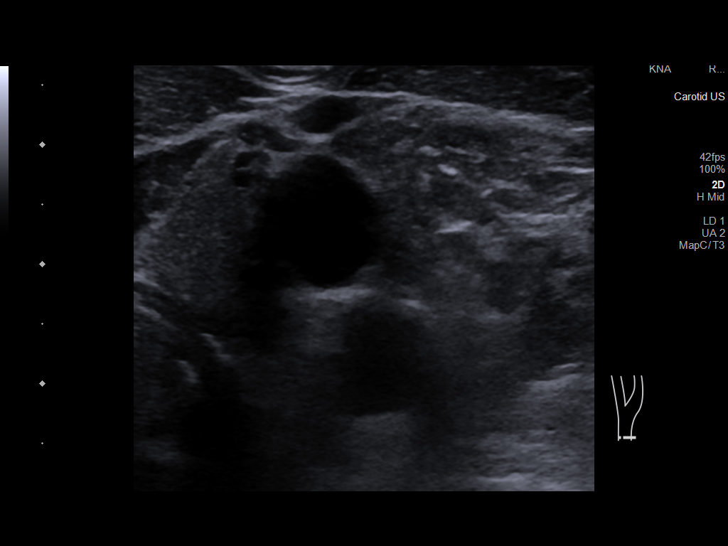
[im 38/68]
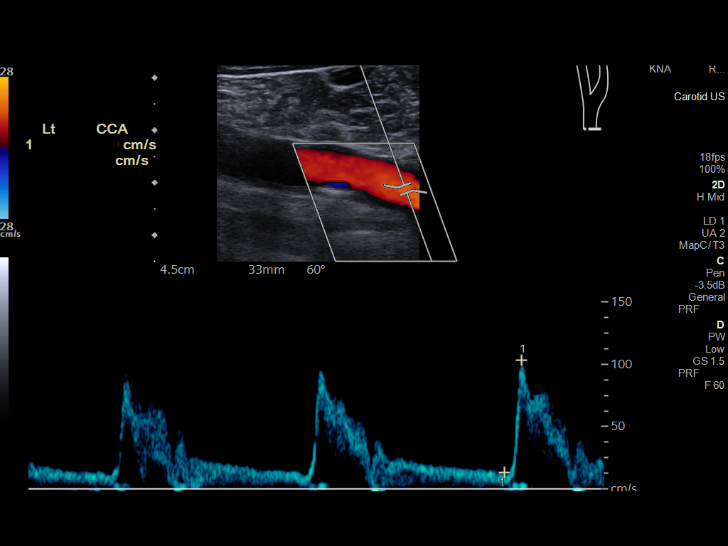
[im 44/68]
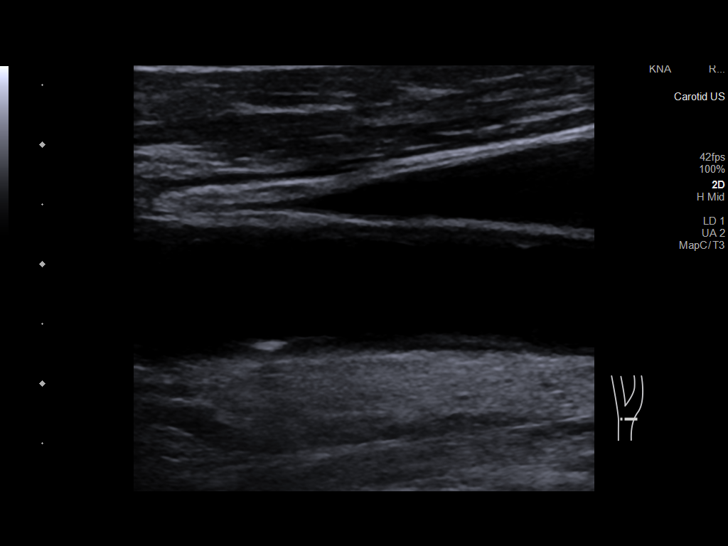
[im 50/68]
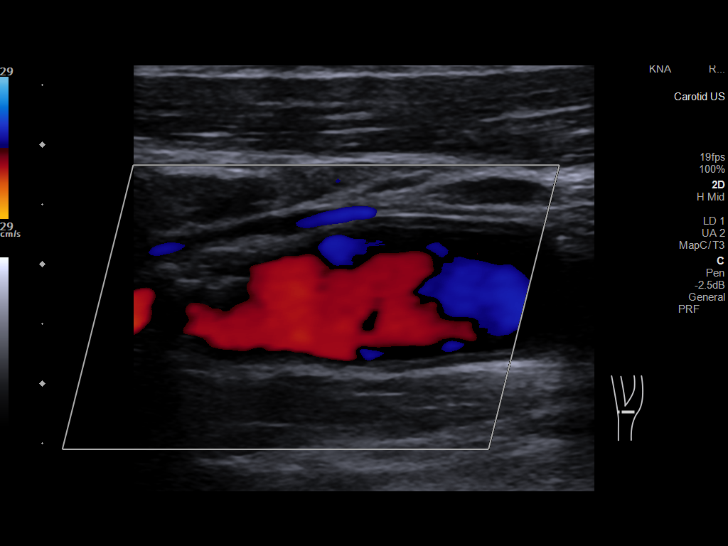
[im 56/68]
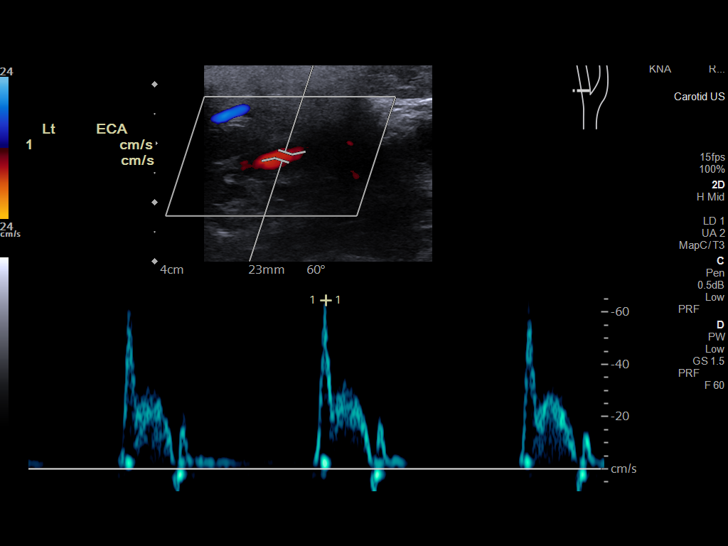
[im 62/68]
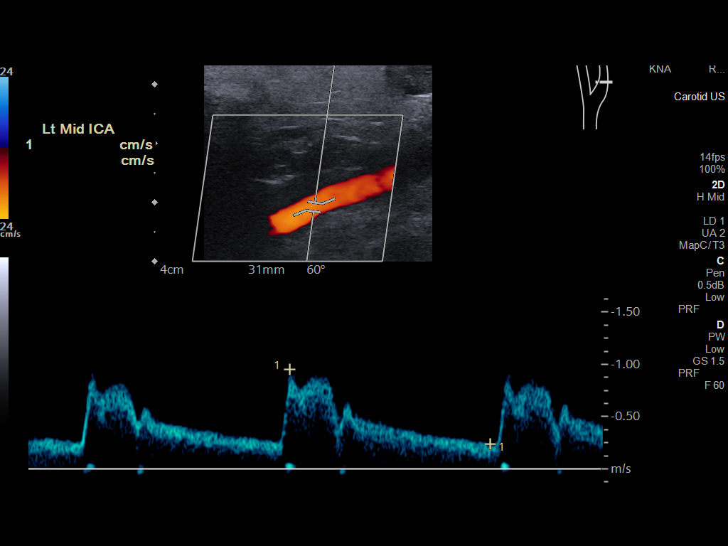
[im 68/68]
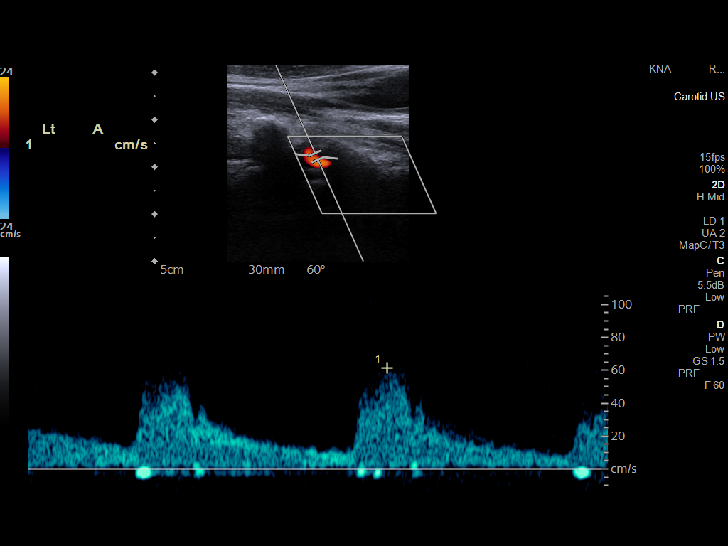

[13 of 24 positions shown; findings below may reference images not displayed]

FINDINGS: Criteria: Quantification of carotid stenosis is based on velocity
parameters that correlate the residual internal carotid diameter
with NASCET-based stenosis levels, using the diameter of the distal
internal carotid lumen as the denominator for stenosis measurement.

The following velocity measurements were obtained:

RIGHT

ICA: 69/19 cm/sec

CCA: 117/8 cm/sec

SYSTOLIC ICA/CCA RATIO:

ECA: 67 cm/sec

LEFT

ICA: 95/24 cm/sec

CCA: 64/8 cm/sec

SYSTOLIC ICA/CCA RATIO:

ECA: 64 cm/sec

RIGHT CAROTID ARTERY: There is a minimal amount of eccentric
echogenic plaque scattered throughout the right common carotid
artery. There is a minimal amount of eccentric echogenic plaque
within the right carotid bulb, extending to involve the origin and
proximal aspects of the right internal carotid artery (image 24),
not resulting in elevated peak systolic velocities within the
interrogated course of the right internal carotid artery to suggest
a hemodynamically significant stenosis.

RIGHT VERTEBRAL ARTERY:  Not visualized

LEFT CAROTID ARTERY: There is a minimal amount of atherosclerotic
plaque scattered throughout the left common carotid artery. There is
a minimal amount of eccentric echogenic plaque involving the origin
and proximal aspects of the left internal carotid artery (image 58),
not resulting in elevated peak systolic velocities within the
interrogated course of the left internal carotid artery to suggest a
hemodynamically significant stenosis.

LEFT VERTEBRAL ARTERY:  Antegrade Flow
IMPRESSION: 1. Minimal amount of bilateral atherosclerotic plaque, right
subjectively greater than left, not resulting in a hemodynamically
significant stenosis within either internal carotid artery.
2. Nonvisualization of the right vertebral artery, an
age-indeterminate finding in the absence of prior examinations.
3. Patent left vertebral artery.

## 2024-05-22 ENCOUNTER — Emergency Department (HOSPITAL_COMMUNITY): Admission: EM | Admit: 2024-05-22 | Discharge: 2024-05-22 | Disposition: A | Discharge status: Home / Self Care

## 2024-05-22 ENCOUNTER — Encounter (HOSPITAL_COMMUNITY): Payer: Self-pay | Admitting: Emergency Medicine

## 2024-05-22 ENCOUNTER — Emergency Department (HOSPITAL_COMMUNITY)

## 2024-05-22 ENCOUNTER — Other Ambulatory Visit: Payer: Self-pay

## 2024-05-22 DIAGNOSIS — Z8673 Personal history of transient ischemic attack (TIA), and cerebral infarction without residual deficits: Secondary | ICD-10-CM | POA: Insufficient documentation

## 2024-05-22 DIAGNOSIS — I10 Essential (primary) hypertension: Secondary | ICD-10-CM | POA: Insufficient documentation

## 2024-05-22 DIAGNOSIS — R062 Wheezing: Secondary | ICD-10-CM | POA: Diagnosis not present

## 2024-05-22 DIAGNOSIS — M79604 Pain in right leg: Secondary | ICD-10-CM | POA: Diagnosis present

## 2024-05-22 DIAGNOSIS — Z79899 Other long term (current) drug therapy: Secondary | ICD-10-CM | POA: Diagnosis not present

## 2024-05-22 DIAGNOSIS — M1990 Unspecified osteoarthritis, unspecified site: Secondary | ICD-10-CM | POA: Diagnosis not present

## 2024-05-22 DIAGNOSIS — F17200 Nicotine dependence, unspecified, uncomplicated: Secondary | ICD-10-CM | POA: Insufficient documentation

## 2024-05-22 MED ORDER — ALBUTEROL SULFATE HFA 108 (90 BASE) MCG/ACT IN AERS
2.0000 | INHALATION_SPRAY | Freq: Once | RESPIRATORY_TRACT | Status: AC
  Administered 2024-05-22: 2 via RESPIRATORY_TRACT
  Filled 2024-05-22: qty 6.7

## 2024-05-22 MED ORDER — KETOROLAC TROMETHAMINE 30 MG/ML IJ SOLN
30.0000 mg | Freq: Once | INTRAMUSCULAR | Status: AC
  Administered 2024-05-22: 30 mg via INTRAMUSCULAR
  Filled 2024-05-22: qty 1

## 2024-05-22 MED ORDER — DEXAMETHASONE SOD PHOSPHATE PF 10 MG/ML IJ SOLN
10.0000 mg | Freq: Once | INTRAMUSCULAR | Status: AC
  Administered 2024-05-22: 10 mg via INTRAMUSCULAR
  Filled 2024-05-22: qty 1

## 2024-05-22 MED ORDER — TRAMADOL HCL 50 MG PO TABS
50.0000 mg | ORAL_TABLET | Freq: Once | ORAL | Status: AC
  Administered 2024-05-22: 50 mg via ORAL
  Filled 2024-05-22: qty 1

## 2024-05-22 MED ORDER — TRAMADOL HCL 50 MG PO TABS: 50.0000 mg | ORAL_TABLET | Freq: Four times a day (QID) | ORAL | 0 refills | Status: AC | PRN

## 2024-05-22 MED ORDER — PREDNISONE 20 MG PO TABS: 40.0000 mg | ORAL_TABLET | Freq: Every day | ORAL | 0 refills | Status: AC

## 2024-05-22 MED ORDER — AEROCHAMBER PLUS FLO-VU MEDIUM MISC
1.0000 | Freq: Once | Status: AC
  Administered 2024-05-22: 1

## 2024-05-22 NOTE — ED Triage Notes (Signed)
 Pov c/o of right sided hip pain that radiates to the right ankle that started yesterday. Pt uses a walker to ambulate

## 2024-05-22 NOTE — Discharge Instructions (Signed)
 You have been prescribed 2 medications to help you with your chronic hip pain.  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  I see that you have seen Dr. Brenna in 2024 who recommended that you needed to have surgery.  Your hip x-ray is worse today and is not going to get better without having surgery.  I do encourage you to follow back up with Dr. Brenna or his partners if you are interested in proceeding with the surgery.  I have prescribed you some medicine to take, first is prednisone  which is a anti-inflammatory pain reliever, take your next dose tomorrow as you have received today's dose here.  I have also prescribed a simple pain reliever, but you will need to use caution with this medication as it can make you drowsy.  I also recommend taking arthritis strength Tylenol  per label instructions.  You were wheezing when you first arrived here which I suspect was the reason for your cough.  Your wheezing resolved after we gave you this breathing inhaled medicine called albuterol .  You can take 1 to 2 puffs every 4 hours if needed for return of your wheezing or your coughing.  You do not need to take this unless you are having symptoms.  Call your primary doctor for recheck of your symptoms in 1 week.

## 2024-05-22 NOTE — ED Notes (Signed)
Pt ambulated with walker to bathroom.

## 2024-05-22 NOTE — ED Provider Notes (Signed)
 "  EMERGENCY DEPARTMENT AT [facility] Provider Note   CSN: [id number] Arrival date & time: 05/22/24  9045     Patient presents with: Leg Pain   Javier [name] is a 79 y.o. male with a history including hypertension, GERD, history of CVA, SVT, uses a walker at baseline presenting for evaluation of right lower back pain which radiates down his right lateral leg to his ankle.  He states he has had problems with hip pain in the past and is known arthritis in this hip, was told he should have surgery but has so far declined.  This pain is focused more in his low back.  He describes a sharp pain that shoots down to his ankle which is worse with movement.  He denies injuries or falls.  He also denies weakness or numbness in his legs, has had no fevers or chills, no urinary fecal incontinence or retention.  Additionally he reports a chronic cough which is sometimes productive of phlegm, when asked how long he has had this symptom he reports a long time.  He denies chest pain, denies shortness of breath.  He does not smoke.    The history is provided by the patient.       Prior to Admission medications  Medication Sig Start Date End Date Taking? Authorizing Provider  predniSONE  (DELTASONE ) 20 MG tablet Take 2 tablets (40 mg total) by mouth daily with breakfast for 4 days. 05/22/24 05/26/24 Yes Ladine Kiper, PA-C  traMADol  (ULTRAM ) 50 MG tablet Take 1 tablet (50 mg total) by mouth every 6 (six) hours as needed for severe pain (pain score 7-10). 05/22/24  Yes Tinzley Dalia, PA-C  acetaminophen  (TYLENOL ) 325 MG tablet Take 1-2 tablets (325-650 mg total) by mouth every 4 (four) hours as needed for mild pain. 10/09/21   Angiulli, Daniel J, PA-C  amiodarone  (PACERONE ) 200 MG tablet Take 1 tablet by mouth twice daily for 14 days, then decrease to 1 tablet once daily. 10/15/21   Lue Elsie BROCKS, MD  benzonatate  (TESSALON ) 100 MG capsule Take 1 capsule (100 mg total) by mouth every 8  (eight) hours. 09/01/23   Daralene Lonni BIRCH, PA-C  irbesartan  (AVAPRO ) 75 MG tablet Take 0.5 tablets (37.5 mg total) by mouth daily. 10/10/21   Angiulli, Toribio PARAS, PA-C  methocarbamol  (ROBAXIN ) 500 MG tablet Take 1 tablet (500 mg total) by mouth every 6 (six) hours as needed for muscle spasms. 10/09/21   Angiulli, Toribio PARAS, PA-C  metoprolol  succinate (TOPROL -XL) 25 MG 24 hr tablet Take 1 tablet (25 mg total) by mouth daily. Take with or immediately following a meal. 10/12/21   Bryn Bernardino NOVAK, MD  pantoprazole  (PROTONIX ) 40 MG tablet Take 1 tablet (40 mg total) by mouth 2 (two) times daily. 09/26/21   Ricky Fines, MD  rosuvastatin  (CRESTOR ) 20 MG tablet Take 20 mg by mouth daily.     [provider]    Allergies: Patient has no known allergies.    Review of Systems  Constitutional:  Negative for fever.  Respiratory:  Positive for cough. Negative for shortness of breath.   Cardiovascular:  Negative for chest pain, palpitations and leg swelling.  Gastrointestinal:  Negative for abdominal distention, abdominal pain and constipation.  Genitourinary:  Negative for difficulty urinating, dysuria, flank pain, frequency and urgency.  Musculoskeletal:  Positive for back pain. Negative for gait problem and joint swelling.  Skin:  Negative for rash.  Neurological:  Negative for weakness and numbness.  Updated Vital Signs BP (!) 147/68 (BP Location: Left Arm)   Pulse (!) 54   Temp 98 F (36.7 C) (Oral)   Resp 20   Ht 5' 11 (1.803 m)   Wt 78.9 kg   SpO2 96%   BMI 24.27 kg/m   Physical Exam Vitals and nursing note reviewed.  Constitutional:      Appearance: He is well-developed.  HENT:     Head: Normocephalic.  Eyes:     Conjunctiva/sclera: Conjunctivae normal.  Cardiovascular:     Rate and Rhythm: Normal rate.     Pulses: Normal pulses.     Comments: Pedal pulses normal. Pulmonary:     Effort: Pulmonary effort is normal. No respiratory distress.     Breath sounds: Wheezing  present. No rales.     Comments: Patient has an intermittent wheeze left mid lung field with expiration, it clears with cough. Chest:     Chest wall: No tenderness.  Abdominal:     General: Bowel sounds are normal. There is no distension.     Palpations: Abdomen is soft. There is no mass.  Musculoskeletal:     Cervical back: Normal range of motion and neck supple.     Lumbar back: Tenderness present. No swelling, edema or spasms. Positive right straight leg raise test.     Comments: Right paralumbar tenderness to palpation, no midline pain of the lumbar spine.  Skin:    General: Skin is warm and dry.  Neurological:     General: No focal deficit present.     Mental Status: He is alert.     Sensory: No sensory deficit.     Motor: No tremor or atrophy.     Gait: Gait normal.     Deep Tendon Reflexes:     Reflex Scores:      Patellar reflexes are 2+ on the right side and 2+ on the left side.    Comments: No strength deficit noted in hip and knee flexor and extensor muscle groups.  Ankle flexion and extension intact.   Hard of hearing.     (all labs ordered are listed, but only abnormal results are displayed) Labs Reviewed - No data to display  EKG: None  Radiology: DG Hip Unilat W or Wo Pelvis 2-3 Views Right Result Date: 05/22/2024 CLINICAL DATA:  Right-sided hip pain radiating to right ankle. Known degenerative disc disease. EXAM: DG HIP (WITH OR WITHOUT PELVIS) 2-3V RIGHT COMPARISON:  10/26/2022. FINDINGS: There is no evidence of acute fracture or dislocation. Severe degenerative changes are noted at the right hip. There are mild degenerative changes at the left hip. IMPRESSION: 1. No acute fracture or dislocation. 2. 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 Electronically Signed   By: Leita Birmingham M.D.   On: 05/22/2024 14:50   DG Chest 2 View Result Date: 05/22/2024 EXAM: 2 VIEW(S) XRAY OF THE CHEST 05/22/2024 11:31:00 AM COMPARISON:  09/01/2023 CLINICAL HISTORY: cough and wheezing FINDINGS: LUNGS AND PLEURA: No focal pulmonary opacity. No pleural effusion. No pneumothorax. HEART AND MEDIASTINUM: No acute abnormality of the cardiac and mediastinal silhouettes. BONES AND SOFT TISSUES: Chronic left rib deformities. Multilevel degenerative disc disease of spine. IMPRESSION: 1. No acute cardiopulmonary process. 2. 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 Electronically signed by: Evalene Coho MD 05/22/2024 11:54 AM EST RP Workstation: [id number]   DG Lumbar Spine Complete Result Date: 05/22/2024 EXAM: 4 VIEW(S) XRAY OF THE  LUMBAR SPINE 05/22/2024 11:31:00 AM COMPARISON: None available. CLINICAL HISTORY: cough and wheezing FINDINGS: LUMBAR SPINE: BONES: Vertebral body heights are maintained. There is slight degenerative anterolisthesis present at L2-L3. There is straightening of the normal lumbar lordosis. DISCS AND DEGENERATIVE CHANGES: There is mild-to-moderate disc space narrowing and endplate ridging throughout the lumbar spine. SOFT TISSUES: There are calcifications within the abdominal aorta and iliac arteries. IMPRESSION: 1. Slight degenerative anterolisthesis at L2-3. 2. Mild-to-moderate disc space narrowing and endplate ridging throughout the lumbar spine. 3. Straightening of the normal lumbar lordosis. 4. Calcifications within the abdominal aorta and iliac arteries. Electronically signed by: Evalene Coho MD 05/22/2024 11:53 AM EST RP Workstation: [id number]     Procedures   Medications Ordered in the ED  albuterol  (VENTOLIN  HFA) 108 (90 Base) MCG/ACT inhaler 2 puff (2 puffs Inhalation Given 05/22/24 1245)  AeroChamber Plus Flo-Vu Medium MISC 1 each (1 each Other Given 05/22/24 1246)  dexamethasone  (DECADRON ) injection 10 mg (10 mg Intramuscular Given 05/22/24 1254)  ketorolac  (TORADOL ) 30 MG/ML injection 30 mg (30 mg Intramuscular Given 05/22/24 1330)  traMADol  (ULTRAM ) tablet 50  mg (50 mg Oral Given 05/22/24 1329)                                    Medical Decision Making Patient senting with 2 complaints, the first being chronic cough which is mostly nonproductive.  He has a distant history of smoking, no known respiratory diagnoses.  He is in no respiratory distress here but he does have a frequent dry sounding cough and he does have an expiratory wheeze which is mild, intermittent and predominantly left-sided.  Chest x-ray is reassuring, he was given an albuterol  MDI 2 puffs after which the cough and the wheezing significantly improved.  Second complaint is of right hip right lower back pain which radiates into his right leg to his ankle, he has no weakness or numbness in the leg, no symptoms or history suggesting cauda equina I.  He does have known severe right hip arthritis, had been encouraged to undergo surgery back in 2024 for arthroplasty which was refused.  I suspect this is a primary source of today's pain although he does have some pain localizing to the right lower lumbar region as well.  X-rays are reassuring.  He will be given a course of prednisone  to not only help with his wheezing but hopefully help improve suspected sciatica.  He is also given a short course of tramadol  with cautions regarding sedation.  Patient was strongly encouraged to follow-up with orthopedics.  Dr. Brenna has retired, he was referred to his partner Dr. Margrette for follow-up care.  Amount and/or Complexity of Data Reviewed Radiology: ordered.    Details: Chest x-ray reviewed, negative for acute cardiopulmonary findings.  Confirming multilevel degenerative disc disease, thoracic and lumbar on chest x-ray and his dedicated lumbar film.  His right hip imaging confirms severe osteoarthritis of the right hip, no fracture or dislocation.  Risk Prescription drug management.        Final diagnoses:  Chronic arthritis  Wheezing on expiration    ED Discharge Orders          Ordered     predniSONE  (DELTASONE ) 20 MG tablet  Daily with breakfast        05/22/24 1441    traMADol  (ULTRAM ) 50 MG tablet  Every 6 hours PRN        05/22/24 1441  Birdena Clarity, PA-C 05/22/24 1802  "

## 2024-05-26 ENCOUNTER — Emergency Department (HOSPITAL_COMMUNITY)

## 2024-05-26 ENCOUNTER — Encounter (HOSPITAL_COMMUNITY): Payer: Self-pay

## 2024-05-26 ENCOUNTER — Other Ambulatory Visit: Payer: Self-pay

## 2024-05-26 ENCOUNTER — Emergency Department (HOSPITAL_COMMUNITY)
Admission: EM | Admit: 2024-05-26 | Discharge: 2024-05-26 | Disposition: A | Discharge status: Home / Self Care | Attending: Emergency Medicine | Admitting: Emergency Medicine

## 2024-05-26 DIAGNOSIS — M1611 Unilateral primary osteoarthritis, right hip: Secondary | ICD-10-CM | POA: Diagnosis not present

## 2024-05-26 DIAGNOSIS — Y9389 Activity, other specified: Secondary | ICD-10-CM | POA: Insufficient documentation

## 2024-05-26 DIAGNOSIS — X501XXA Overexertion from prolonged static or awkward postures, initial encounter: Secondary | ICD-10-CM | POA: Insufficient documentation

## 2024-05-26 DIAGNOSIS — M79604 Pain in right leg: Secondary | ICD-10-CM | POA: Diagnosis present

## 2024-05-26 MED ORDER — KETOROLAC TROMETHAMINE 30 MG/ML IJ SOLN
30.0000 mg | Freq: Once | INTRAMUSCULAR | Status: AC
  Administered 2024-05-26: 30 mg via INTRAMUSCULAR
  Filled 2024-05-26: qty 1

## 2024-05-26 NOTE — ED Notes (Signed)
 Pt provided Walker assistive device and Pt was able to ambulate independently back and forth down the hallway. EDP Notified.  Pt reports he is going to call his family for a ride home.

## 2024-05-26 NOTE — ED Provider Notes (Signed)
 " [place] EMERGENCY DEPARTMENT AT [facility] Provider Note   CSN: [id number] Arrival date & time: 05/26/24  1055     Patient presents with: Leg Pain (Right upper leg)   Javier [name] is a 79 y.o. male.  He is brought in by ambulance with complaint of pain in his left upper leg this morning.  He said he had bent down to work on a stove and could not get up due to the pain.  He said he chronically has pain in his right knee but that does not hurt him now it is more in his thigh and hip area.  He has tried nothing for it.  No numbness.  No weakness.  No other complaints.  Prior history of stroke.  Uses walker to ambulate.  Was here 4 days ago for right leg pain radiating down from his back.  Imaging negative.  Given dexamethasone  and tramadol .  Prior history of arthritis right hip noted in 2024   The history is provided by the patient and the EMS personnel.  Leg Pain Location:  Leg Leg location:  R upper leg Pain details:    Quality:  Aching   Severity:  Moderate Associated symptoms: no fever, no muscle weakness, no numbness and no tingling        Prior to Admission medications  Medication Sig Start Date End Date Taking? Authorizing Provider  acetaminophen  (TYLENOL ) 325 MG tablet Take 1-2 tablets (325-650 mg total) by mouth every 4 (four) hours as needed for mild pain. 10/09/21   Angiulli, Daniel J, PA-C  amiodarone  (PACERONE ) 200 MG tablet Take 1 tablet by mouth twice daily for 14 days, then decrease to 1 tablet once daily. 10/15/21   Lue Elsie BROCKS, MD  benzonatate  (TESSALON ) 100 MG capsule Take 1 capsule (100 mg total) by mouth every 8 (eight) hours. 09/01/23   Daralene Lonni BIRCH, PA-C  irbesartan  (AVAPRO ) 75 MG tablet Take 0.5 tablets (37.5 mg total) by mouth daily. 10/10/21   Angiulli, Toribio PARAS, PA-C  methocarbamol  (ROBAXIN ) 500 MG tablet Take 1 tablet (500 mg total) by mouth every 6 (six) hours as needed for muscle spasms. 10/09/21   Angiulli, Toribio PARAS, PA-C   metoprolol  succinate (TOPROL -XL) 25 MG 24 hr tablet Take 1 tablet (25 mg total) by mouth daily. Take with or immediately following a meal. 10/12/21   Bryn Bernardino NOVAK, MD  pantoprazole  (PROTONIX ) 40 MG tablet Take 1 tablet (40 mg total) by mouth 2 (two) times daily. 09/26/21   Ricky Fines, MD  predniSONE  (DELTASONE ) 20 MG tablet Take 2 tablets (40 mg total) by mouth daily with breakfast for 4 days. 05/22/24 05/26/24  Idol, Julie, PA-C  rosuvastatin  (CRESTOR ) 20 MG tablet Take 20 mg by mouth daily.     [provider]  traMADol  (ULTRAM ) 50 MG tablet Take 1 tablet (50 mg total) by mouth every 6 (six) hours as needed for severe pain (pain score 7-10). 05/22/24   Idol, Julie, PA-C    Allergies: Patient has no known allergies.    Review of Systems  Constitutional:  Negative for fever.    Updated Vital Signs BP (!) 145/63 (BP Location: Left Arm)   Pulse (!) 52   Temp 97.8 F (36.6 C) (Oral)   Resp 20   Ht 5' 11 (1.803 m)   Wt 79.4 kg   SpO2 95%   BMI 24.41 kg/m   Physical Exam Vitals and nursing note reviewed.  Constitutional:  Appearance: Normal appearance. He is well-developed.  HENT:     Head: Normocephalic and atraumatic.  Eyes:     Conjunctiva/sclera: Conjunctivae normal.  Cardiovascular:     Rate and Rhythm: Normal rate and regular rhythm.     Heart sounds: No murmur heard. Pulmonary:     Effort: Pulmonary effort is normal. No respiratory distress.     Breath sounds: Normal breath sounds.  Abdominal:     Palpations: Abdomen is soft.     Tenderness: There is no abdominal tenderness.  Musculoskeletal:        General: Tenderness present. No deformity. Normal range of motion.     Cervical back: Neck supple.     Comments: He has diffuse tenderness through his right hip and thigh.  Distal pulses motor and sensation intact.  Skin:    General: Skin is warm and dry.  Neurological:     Mental Status: He is alert.     GCS: GCS eye subscore is 4. GCS verbal subscore  is 5. GCS motor subscore is 6.     Sensory: No sensory deficit.     Motor: No weakness.     (all labs ordered are listed, but only abnormal results are displayed) Labs Reviewed - No data to display  EKG: None  Radiology: DG Femur Min 2 Views Right Result Date: 05/26/2024 EXAM: 2 VIEW(S) XRAY OF THE RIGHT FEMUR 05/26/2024 12:01:00 PM COMPARISON: None available. CLINICAL HISTORY: Leg pain. FINDINGS: BONES AND JOINTS: No acute fracture. No malalignment. Severe degenerative changes of right hip. SOFT TISSUES: Perivascular atherosclerosis. IMPRESSION: 1. No acute fracture or dislocation. 2. Severe osteoarthritis of the right hip. Electronically signed by: Rogelia Myers MD 05/26/2024 12:50 PM EST RP Workstation: [id number]   DG Knee Complete 4 Views Right Result Date: 05/26/2024 EXAM: 4 OR MORE VIEW(S) XRAY OF THE KNEE 05/26/2024 12:01:00 PM COMPARISON: None available. CLINICAL HISTORY: Leg pain. FINDINGS: BONES AND JOINTS: No acute fracture. No malalignment. No significant joint effusion. Osteopenia. SOFT TISSUES: Peripheral vascular calcifications present. IMPRESSION: 1. No acute fracture or dislocation. Electronically signed by: Rogelia Myers MD 05/26/2024 12:45 PM EST RP Workstation: [id number]   DG Pelvis 1-2 Views Result Date: 05/26/2024 CLINICAL DATA:  Pain EXAM: PELVIS - 1-2 VIEW COMPARISON:  None Available. FINDINGS: There is no evidence of pelvic fracture or diastasis. No pelvic bone lesions are seen. Severe narrowing of right hip is noted consistent with osteoarthritis. Mild degenerative changes seen involving the left hip. IMPRESSION: Severe right hip osteoarthritis. No acute abnormality seen. Electronically Signed   By: Lynwood Landy Raddle M.D.   On: 05/26/2024 12:28     Procedures   Medications Ordered in the ED  ketorolac  (TORADOL ) 30 MG/ML injection 30 mg (30 mg Intramuscular Given 05/26/24 1350)    Clinical Course as of 05/26/24 1722  Thu May 26, 2024  1342 Imaging showing  degenerative changes right hip.  Similar to few days ago.  Will try dose of Toradol  and ambulate. [MB]  1423 Was able to ambulate in the department with a walker.  He is calling for a ride. [MB]    Clinical Course User Index [MB] Towana Ozell BROCKS, MD                                 Medical Decision Making Amount and/or Complexity of Data Reviewed Radiology: ordered.  Risk Prescription drug management.   This patient complains of right hip and thigh  pain; this involves an extensive number of treatment Options and is a complaint that carries with it a high risk of complications and morbidity. The differential includes musculoskeletal pain, radiculopathy, fracture, DVT I ordered medication IM Toradol  and reviewed PMP when indicated. I ordered imaging studies which included x-rays of the pelvis femur and knee and I independently    visualized and interpreted imaging which showed degenerative changes Previous records obtained and reviewed in epic including ED visit from a few days ago for similar presentation Social determinants considered, no significant barriers Critical Interventions: None  After the interventions stated above, I reevaluated the patient and found patient to be neurovascularly intact.  He is able to ambulate in the department with a walker.  He is comfortable plan for discharge. Admission and further testing considered, no indications for admission at this time.  Recommended follow-up with PCP and given contact information for outpatient Ortho.  Return instructions discussed      Final diagnoses:  Right leg pain  Arthritis of right hip    ED Discharge Orders     None          Towana Ozell BROCKS, MD 05/26/24 1723  "

## 2024-05-26 NOTE — ED Triage Notes (Signed)
 Pt arrived via [facility] form home c/o his right knee and leg giving out non him this morning when the Pt reports he was bending over to clean out his wood stove. Pt denies hitting head, denies loss of consciousness. Pt reports he ambulates with a walker at home.

## 2024-05-31 ENCOUNTER — Other Ambulatory Visit: Payer: Self-pay

## 2024-05-31 ENCOUNTER — Encounter (HOSPITAL_COMMUNITY): Payer: Self-pay

## 2024-05-31 ENCOUNTER — Emergency Department (HOSPITAL_COMMUNITY)
Admission: EM | Admit: 2024-05-31 | Discharge: 2024-05-31 | Disposition: A | Discharge status: Home / Self Care | Attending: Emergency Medicine | Admitting: Emergency Medicine

## 2024-05-31 DIAGNOSIS — I10 Essential (primary) hypertension: Secondary | ICD-10-CM | POA: Insufficient documentation

## 2024-05-31 DIAGNOSIS — M1611 Unilateral primary osteoarthritis, right hip: Secondary | ICD-10-CM | POA: Diagnosis not present

## 2024-05-31 DIAGNOSIS — Z8673 Personal history of transient ischemic attack (TIA), and cerebral infarction without residual deficits: Secondary | ICD-10-CM | POA: Diagnosis not present

## 2024-05-31 DIAGNOSIS — M2550 Pain in unspecified joint: Secondary | ICD-10-CM | POA: Diagnosis present

## 2024-05-31 MED ORDER — OXYCODONE-ACETAMINOPHEN 5-325 MG PO TABS: 1.0000 | ORAL_TABLET | Freq: Four times a day (QID) | ORAL | 0 refills | Status: AC | PRN

## 2024-05-31 MED ORDER — ONDANSETRON 4 MG PO TBDP
4.0000 mg | ORAL_TABLET | Freq: Once | ORAL | Status: AC
  Administered 2024-05-31: 4 mg via ORAL
  Filled 2024-05-31: qty 1

## 2024-05-31 MED ORDER — HYDROMORPHONE HCL 1 MG/ML IJ SOLN
1.0000 mg | Freq: Once | INTRAMUSCULAR | Status: AC
  Administered 2024-05-31: 1 mg via INTRAMUSCULAR
  Filled 2024-05-31: qty 1

## 2024-05-31 MED ORDER — DOCUSATE SODIUM 100 MG PO CAPS: 100.0000 mg | ORAL_CAPSULE | Freq: Two times a day (BID) | ORAL | 0 refills | Status: AC

## 2024-05-31 NOTE — ED Provider Notes (Addendum)
 " [place] EMERGENCY DEPARTMENT AT [facility] Provider Note   CSN: [id number] Arrival date & time: 05/31/24  9170     Patient presents with: Back Pain   Javier [name] is a 79 y.o. male with a history including hypertension, hyperlipidemia, GERD history of CVA, history of SVT presenting for evaluation of acute on chronic low back pain.  He uses a walker at baseline, having increasing difficulty with ambulation even with use of his walker secondary to severe pain described as sharp pain from his right buttock, right upper thigh radiating into his lower leg with weight bearing and movement.  He denies weakness, numbness, urinary incontinence, low back pain.  He was given prednisone  and tramadol  when seen here on 1/18 which he states did no good.  He has not yet reached out to orthopedics but would consider talking to them regarding hip surgery since he now realizes this is not going to get better without surgical intervention.  He declined surgery several years ago under the care of Dr. Brenna.   The history is provided by the patient.       Prior to Admission medications  Medication Sig Start Date End Date Taking? Authorizing Provider  docusate sodium  (COLACE) 100 MG capsule Take 1 capsule (100 mg total) by mouth every 12 (twelve) hours. 05/31/24  Yes Coulson Wehner, PA-C  oxyCODONE -acetaminophen  (PERCOCET/ROXICET) 5-325 MG tablet Take 1 tablet by mouth every 6 (six) hours as needed for severe pain (pain score 7-10). 05/31/24  Yes Lucilla Petrenko, PA-C  acetaminophen  (TYLENOL ) 325 MG tablet Take 1-2 tablets (325-650 mg total) by mouth every 4 (four) hours as needed for mild pain. 10/09/21   Angiulli, Daniel J, PA-C  amiodarone  (PACERONE ) 200 MG tablet Take 1 tablet by mouth twice daily for 14 days, then decrease to 1 tablet once daily. 10/15/21   Lue Elsie BROCKS, MD  benzonatate  (TESSALON ) 100 MG capsule Take 1 capsule (100 mg total) by mouth every 8 (eight) hours. 09/01/23    Daralene Lonni BIRCH, PA-C  irbesartan  (AVAPRO ) 75 MG tablet Take 0.5 tablets (37.5 mg total) by mouth daily. 10/10/21   Angiulli, Toribio PARAS, PA-C  methocarbamol  (ROBAXIN ) 500 MG tablet Take 1 tablet (500 mg total) by mouth every 6 (six) hours as needed for muscle spasms. 10/09/21   Angiulli, Toribio PARAS, PA-C  metoprolol  succinate (TOPROL -XL) 25 MG 24 hr tablet Take 1 tablet (25 mg total) by mouth daily. Take with or immediately following a meal. 10/12/21   Bryn Bernardino NOVAK, MD  pantoprazole  (PROTONIX ) 40 MG tablet Take 1 tablet (40 mg total) by mouth 2 (two) times daily. 09/26/21   Ricky Fines, MD  rosuvastatin  (CRESTOR ) 20 MG tablet Take 20 mg by mouth daily.     [provider]  traMADol  (ULTRAM ) 50 MG tablet Take 1 tablet (50 mg total) by mouth every 6 (six) hours as needed for severe pain (pain score 7-10). 05/22/24   Nash Bolls, PA-C    Allergies: Patient has no known allergies.    Review of Systems  Constitutional:  Negative for chills and fever.  Respiratory: Negative.    Gastrointestinal: Negative.   Genitourinary: Negative.   Musculoskeletal:  Positive for arthralgias. Negative for joint swelling and myalgias.  Neurological:  Negative for weakness and numbness.    Updated Vital Signs BP (!) 141/80 (BP Location: Right Arm)   Pulse (!) 57   Temp 98 F (36.7 C) (Oral)   Resp 18   Ht 5' 11 (1.803  m)   Wt 79.4 kg   SpO2 94%   BMI 24.41 kg/m   Physical Exam Vitals and nursing note reviewed.  Constitutional:      Appearance: He is well-developed.  HENT:     Head: Normocephalic.  Eyes:     Conjunctiva/sclera: Conjunctivae normal.  Cardiovascular:     Rate and Rhythm: Normal rate.     Pulses: Normal pulses.     Comments: Pedal pulses normal. Pulmonary:     Effort: Pulmonary effort is normal.  Abdominal:     General: Bowel sounds are normal. There is no distension.     Palpations: Abdomen is soft. There is no mass.  Musculoskeletal:        General: Normal range of  motion.     Cervical back: Normal range of motion and neck supple.     Lumbar back: No swelling, edema, spasms or tenderness.     Right upper leg: No swelling or deformity.     Comments: No reproducible pain with palpation right hip and thigh area.  Pain elicited by active and passive ROM of the right hip.   Skin:    General: Skin is warm and dry.  Neurological:     Mental Status: He is alert.     Sensory: No sensory deficit.     Motor: No tremor or atrophy.     Gait: Gait normal.     Deep Tendon Reflexes:     Reflex Scores:      Patellar reflexes are 2+ on the right side and 2+ on the left side.      Achilles reflexes are 2+ on the right side and 2+ on the left side.    Comments: No strength deficit noted in hip and knee flexor and extensor muscle groups.  Ankle flexion and extension intact.     (all labs ordered are listed, but only abnormal results are displayed) Labs Reviewed - No data to display  EKG: None  Radiology: No results found.   Procedures   Medications Ordered in the ED  ondansetron  (ZOFRAN -ODT) disintegrating tablet 4 mg (4 mg Oral Given 05/31/24 0920)  HYDROmorphone  (DILAUDID ) injection 1 mg (1 mg Intramuscular Given 05/31/24 0920)                                    Medical Decision Making Pt with known severe djd right hip who historically has declined surgical intervention.  Todays presentation suggests this as main source of sx, others include lumbar radiculopathy, fracture/dislocation although denies any injury or fall, no peripheral edema, no skin redness or rash, doubt dvt,  cellulitis, shingles, no exam findings to suggest abscess/infection. He was given IM dilaudid  after which pain is improved,  given small quantity of oxycodone  script, colace, cautions discussed.  Strongly encouraged pt to f/u with Dr. Margrette for further management of this known condition.   Pt was ambulated in dept with sig improvement in pain, no neuro deficits.  Amount and/or  Complexity of Data Reviewed Radiology:     Details: No new imaging indicated.  Imaging from recent ed visits reviewed including lumbar and right hip.   Risk OTC drugs. Prescription drug management. Diagnosis or treatment significantly limited by social determinants of health.        Final diagnoses:  Primary osteoarthritis of right hip    ED Discharge Orders          Ordered  oxyCODONE -acetaminophen  (PERCOCET/ROXICET) 5-325 MG tablet  Every 6 hours PRN        05/31/24 0953    docusate sodium  (COLACE) 100 MG capsule  Every 12 hours        05/31/24 0953               Rameses Ou, PA-C 05/31/24 1000    Birdena Clarity, PA-C 05/31/24 1043    Freddi Hamilton, MD 05/31/24 1358  "

## 2024-05-31 NOTE — ED Triage Notes (Signed)
 Pt arrived [facility] due to chronic lower back pain. Pt stated the pain is starting to impact his ADL's and ability to use his walker at home. Pt stated pain radiates from his lower back to his legs bilateral. Pt rate pain at a 10 when he tries getting up. Pt denies Chest Pain, SHOB, and N/V.

## 2024-05-31 NOTE — Discharge Instructions (Addendum)
 You have severe arthritis in your right hip which is causing your pain.  This will not get better without intervention such as surgery.  Please call Dr Areatha office today to arrange an office appointment with him to further discuss surgery or other ways he can help you with your hip pain.    I am prescribing you a small quantity of stronger pain medicine to help you until you can see Dr Margrette.  This will make you drowsy and can also cause constipation.  Do not drive while taking this medicine.  You should take a stool softener while on this as well which has been prescribed.

## 2024-06-02 ENCOUNTER — Encounter: Admitting: Orthopedic Surgery

## 2024-07-12 ENCOUNTER — Ambulatory Visit: Admitting: Orthopedic Surgery
